# Patient Record
Sex: Female | Born: 1995 | Race: White | Hispanic: No | Marital: Single | Smoking: Never smoker
Health system: Southern US, Community
[De-identification: ages and names within clinical notes are randomized; demographics above are authoritative.]

## PROBLEM LIST (undated history)

## (undated) ENCOUNTER — Inpatient Hospital Stay (HOSPITAL_COMMUNITY): Payer: Self-pay

## (undated) DIAGNOSIS — M549 Dorsalgia, unspecified: Secondary | ICD-10-CM

## (undated) DIAGNOSIS — O139 Gestational [pregnancy-induced] hypertension without significant proteinuria, unspecified trimester: Secondary | ICD-10-CM

## (undated) DIAGNOSIS — A599 Trichomoniasis, unspecified: Secondary | ICD-10-CM

## (undated) DIAGNOSIS — F419 Anxiety disorder, unspecified: Secondary | ICD-10-CM

## (undated) DIAGNOSIS — R51 Headache: Secondary | ICD-10-CM

## (undated) DIAGNOSIS — B009 Herpesviral infection, unspecified: Secondary | ICD-10-CM

## (undated) DIAGNOSIS — R519 Headache, unspecified: Secondary | ICD-10-CM

## (undated) DIAGNOSIS — B999 Unspecified infectious disease: Secondary | ICD-10-CM

## (undated) DIAGNOSIS — M62838 Other muscle spasm: Secondary | ICD-10-CM

## (undated) DIAGNOSIS — G8929 Other chronic pain: Secondary | ICD-10-CM

## (undated) DIAGNOSIS — K219 Gastro-esophageal reflux disease without esophagitis: Secondary | ICD-10-CM

## (undated) HISTORY — DX: Anxiety disorder, unspecified: F41.9

## (undated) HISTORY — DX: Trichomoniasis, unspecified: A59.9

## (undated) HISTORY — DX: Other muscle spasm: M62.838

## (undated) HISTORY — DX: Headache: R51

## (undated) HISTORY — DX: Other chronic pain: G89.29

## (undated) HISTORY — DX: Dorsalgia, unspecified: M54.9

## (undated) HISTORY — DX: Herpesviral infection, unspecified: B00.9

## (undated) HISTORY — DX: Headache, unspecified: R51.9

## (undated) HISTORY — PX: NO PAST SURGERIES: SHX2092

---
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
## 2016-07-24 ENCOUNTER — Encounter (HOSPITAL_COMMUNITY): Payer: Self-pay

## 2016-07-24 ENCOUNTER — Emergency Department (HOSPITAL_COMMUNITY)
Admission: EM | Admit: 2016-07-24 | Discharge: 2016-07-24 | Disposition: A | Discharge status: Home / Self Care | Payer: Medicaid Other | Attending: Emergency Medicine | Admitting: Emergency Medicine

## 2016-07-24 DIAGNOSIS — K529 Noninfective gastroenteritis and colitis, unspecified: Secondary | ICD-10-CM

## 2016-07-24 LAB — COMPREHENSIVE METABOLIC PANEL
ALT: 21 U/L (ref 14–54)
AST: 24 U/L (ref 15–41)
Albumin: 3.9 g/dL (ref 3.5–5.0)
Alkaline Phosphatase: 83 U/L (ref 38–126)
Anion gap: 8 (ref 5–15)
BUN: 10 mg/dL (ref 6–20)
CHLORIDE: 106 mmol/L (ref 101–111)
CO2: 25 mmol/L (ref 22–32)
Calcium: 8.8 mg/dL — ABNORMAL LOW (ref 8.9–10.3)
Creatinine, Ser: 0.73 mg/dL (ref 0.44–1.00)
GFR calc Af Amer: >60 mL/min (ref >60)
Glucose, Bld: 97 mg/dL (ref 65–99)
POTASSIUM: 3.3 mmol/L — AB (ref 3.5–5.1)
SODIUM: 139 mmol/L (ref 135–145)
Total Bilirubin: 0.5 mg/dL (ref 0.3–1.2)
Total Protein: 7.1 g/dL (ref 6.5–8.1)

## 2016-07-24 LAB — CBC
HEMATOCRIT: 41.4 % (ref 36.0–46.0)
Hemoglobin: 13.7 g/dL (ref 12.0–15.0)
MCH: 30.9 pg (ref 26.0–34.0)
MCHC: 33.1 g/dL (ref 30.0–36.0)
MCV: 93.2 fL (ref 78.0–100.0)
Platelets: 190 10*3/uL (ref 150–400)
RBC: 4.44 MIL/uL (ref 3.87–5.11)
RDW: 12.4 % (ref 11.5–15.5)
WBC: 7.2 10*3/uL (ref 4.0–10.5)

## 2016-07-24 LAB — I-STAT BETA HCG BLOOD, ED (MC, WL, AP ONLY): I-stat hCG, quantitative: <5 m[IU]/mL (ref <5)

## 2016-07-24 LAB — LIPASE, BLOOD: LIPASE: 87 U/L — AB (ref 11–51)

## 2016-07-24 MED ORDER — ONDANSETRON HCL 4 MG/2ML IJ SOLN
4.0000 mg | Freq: Once | INTRAMUSCULAR | Status: DC
  Filled 2016-07-24: qty 2

## 2016-07-24 MED ORDER — SODIUM CHLORIDE 0.9 % IV BOLUS (SEPSIS)
1000.0000 mL | Freq: Once | INTRAVENOUS | Status: AC
  Administered 2016-07-24: 1000 mL via INTRAVENOUS

## 2016-07-24 MED ORDER — ONDANSETRON 4 MG PO TBDP
ORAL_TABLET | ORAL | Status: AC
  Filled 2016-07-24: qty 1

## 2016-07-24 MED ORDER — ONDANSETRON 4 MG PO TBDP
4.0000 mg | ORAL_TABLET | Freq: Once | ORAL | Status: AC | PRN
Start: 2016-07-24 — End: 2016-07-24
  Administered 2016-07-24: 4 mg via ORAL

## 2016-07-24 MED ORDER — KETOROLAC TROMETHAMINE 30 MG/ML IJ SOLN
30.0000 mg | Freq: Once | INTRAMUSCULAR | Status: AC
  Administered 2016-07-24: 30 mg via INTRAVENOUS
  Filled 2016-07-24: qty 1

## 2016-07-24 MED ORDER — ONDANSETRON HCL 8 MG PO TABS: 8.0000 mg | ORAL_TABLET | Freq: Three times a day (TID) | ORAL | 0 refills | Status: DC | PRN

## 2016-07-24 NOTE — ED Provider Notes (Signed)
[facility]-EMERGENCY DEPT Provider Note   CSN: [id number] Arrival date & time: 07/24/16  2020     History   Chief Complaint Chief Complaint  Patient presents with  . Abdominal Pain    HPI Judith Terry is a 20 y.o. female.  Patient is a 20 year old female with no significant past medical history. She presents for evaluation of nausea, vomiting, and loose stools that is been ongoing for the past several days. All has been nonbloody and nonbilious. She denies any fevers or chills. She denies any ill contacts. She denies the consumption of any suspicious foods.   The history is provided by the patient.  Abdominal Pain   This is a new problem. Episode onset: Several days ago. The problem occurs constantly. The problem has not changed since onset.The pain is located in the periumbilical region. The pain is moderate. Pertinent negatives include fever, hematochezia, melena and constipation. Nothing aggravates the symptoms. Nothing relieves the symptoms.    Past Medical History:  Diagnosis Date  . GERD (gastroesophageal reflux disease)     Patient Active Problem List   Diagnosis Date Noted  . Preeclampsia 06/03/2015  . 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 Fetal echogenic bowel of fetus   . Contact with or exposure to sexually transmitted disease 11/25/2014  . Supervision of normal first pregnancy in first trimester 11/25/2014  . GERD (gastroesophageal reflux disease) 11/25/2014    Past Surgical History:  Procedure Laterality Date  . NO PAST SURGERIES      OB History    Gravida Para Term Preterm AB Living   1 1 1     1    SAB TAB Ectopic Multiple Live Births         0 1       Home Medications    Prior to Admission medications   Medication Sig Start Date End Date Taking? Authorizing Provider  cyclobenzaprine (FLEXERIL) 10 MG tablet Take 10 mg by mouth as needed for muscle spasms.    Historical Provider, MD  hpv vaccine  (GARDASIL) injection Inject 0.5 mLs into the muscle once. 07/10/15   Allie BossierMyra C Dove, MD  omeprazole (PRILOSEC) 40 MG capsule Take 40 mg by mouth as needed.    Historical Provider, MD    Family History Family History  Problem Relation Age of Onset  . Diabetes Mother   . Diabetes Father   . Heart disease Father     Social History Social History  Substance Use Topics  . Smoking status: Never Smoker  . Smokeless tobacco: Never Used  . Alcohol use No     Allergies   Patient has no known allergies.   Review of Systems Review of Systems  Constitutional: Negative for fever.  Gastrointestinal: Positive for abdominal pain. Negative for constipation, hematochezia and melena.  All other systems reviewed and are negative.    Physical Exam Updated Vital Signs BP 122/83 (BP Location: Right Arm)   Pulse 93   Temp 98.5 F (36.9 C) (Oral)   Resp 18   Ht 5\' 9"  (1.753 m)   Wt 221 lb 8 oz (100.5 kg)   LMP 07/22/2016   SpO2 99%   BMI 32.71 kg/m   Physical Exam  Constitutional: She is oriented to person, place, and time. She appears well-developed and well-nourished. No distress.  HENT:  Head: Normocephalic and atraumatic.  Neck: Normal range of motion. Neck supple.  Cardiovascular: Normal rate and regular rhythm.  Exam reveals no gallop  and no friction rub.   No murmur heard. Pulmonary/Chest: Effort normal and breath sounds normal. No respiratory distress. She has no wheezes.  Abdominal: Soft. Bowel sounds are normal. She exhibits no distension. There is tenderness.  There is mild tenderness to palpation in the periumbilical region.  Musculoskeletal: Normal range of motion.  Neurological: She is alert and oriented to person, place, and time.  Skin: Skin is warm and dry. She is not diaphoretic.  Nursing note and vitals reviewed.    ED Treatments / Results  Labs (all labs ordered are listed, but only abnormal results are displayed) Labs Reviewed  LIPASE, BLOOD - Abnormal;  Notable for the following:       Result Value   Lipase 87 (*)    All other components within normal limits  COMPREHENSIVE METABOLIC PANEL - Abnormal; Notable for the following:    Potassium 3.3 (*)    Calcium 8.8 (*)    All other components within normal limits  CBC  URINALYSIS, ROUTINE W REFLEX MICROSCOPIC (NOT AT [facility])  I-STAT BETA HCG BLOOD, ED ([facility], [facility], [facility] ONLY)    EKG  EKG Interpretation None       Radiology No results found.  Procedures Procedures (including critical care time)  Medications Ordered in ED Medications  ondansetron (ZOFRAN) injection 4 mg (4 mg Intravenous Refused 07/24/16 2147)  ondansetron (ZOFRAN-ODT) disintegrating tablet 4 mg (4 mg Oral Given 07/24/16 2033)  ketorolac (TORADOL) 30 MG/ML injection 30 mg (30 mg Intravenous Given 07/24/16 2147)  sodium chloride 0.9 % bolus 1,000 mL (1,000 mLs Intravenous New Bag/Given 07/24/16 2146)     Initial Impression / Assessment and Plan / ED Course  I have reviewed the triage vital signs and the nursing notes.  Pertinent labs & imaging results that were available during my care of the patient were reviewed by me and considered in my medical decision making (see chart for details).  Clinical Course     Symptoms most likely viral in nature. Her lipase is slightly elevated, the significance of which I am uncertain. I suspect this is likely related to vomiting. She will be discharged with Zofran and when necessary return.  Final Clinical Impressions(s) / ED Diagnoses   Final diagnoses:  None    New Prescriptions New Prescriptions   No medications on file     Geoffery Lyonsouglas Kenrick Pore, MD 07/24/16 2211

## 2016-07-24 NOTE — ED Triage Notes (Signed)
Onset 07-20-16 abd pain, nausea, and vomiting.  Last vomited this morning.  Last BM  This morning, loose.

## 2016-07-24 NOTE — Discharge Instructions (Signed)
Zofran as prescribed as needed for nausea.  A liquid diet for the next 2 days, then slowly advance to normal.  Return to the emergency department if you develop worsening pain, fevers, bloody stools, or other new and concerning symptoms.

## 2017-01-05 ENCOUNTER — Emergency Department (HOSPITAL_COMMUNITY)
Admission: EM | Admit: 2017-01-05 | Discharge: 2017-01-06 | Disposition: A | Discharge status: Home / Self Care | Payer: BLUE CROSS/BLUE SHIELD | Attending: Emergency Medicine | Admitting: Emergency Medicine

## 2017-01-05 ENCOUNTER — Encounter (HOSPITAL_COMMUNITY): Payer: Self-pay | Admitting: Emergency Medicine

## 2017-01-05 DIAGNOSIS — N644 Mastodynia: Secondary | ICD-10-CM | POA: Insufficient documentation

## 2017-01-05 LAB — POC URINE PREG, ED: PREG TEST UR: NEGATIVE

## 2017-01-05 MED ORDER — IBUPROFEN 800 MG PO TABS
800.0000 mg | ORAL_TABLET | Freq: Once | ORAL | Status: AC
  Administered 2017-01-05: 800 mg via ORAL
  Filled 2017-01-05: qty 1

## 2017-01-05 MED ORDER — NAPROXEN 500 MG PO TABS: 500.0000 mg | ORAL_TABLET | Freq: Two times a day (BID) | ORAL | 0 refills | Status: DC

## 2017-01-05 NOTE — ED Notes (Signed)
c/o left sided mast pain verbal  Hurts for 1 week  No rash, bump or other present  Verbalized by pt / non assessment description

## 2017-01-05 NOTE — ED Triage Notes (Signed)
Pt comes with complaints of left breast pain that started last week.  Intermittent through out today. Denies noticing any lumps or changes to breast.  A&O x4.  No other complaints at this time.

## 2017-01-05 NOTE — ED Provider Notes (Signed)
[facility]-EMERGENCY DEPT Provider Note   CSN: [id number] Arrival date & time: 01/05/17  2010   By signing my name below, I, Judith Terry, attest that this documentation has been prepared under the direction and in the presence of Parminder Trapani, MD. Electronically signed, Judith Terry, ED Scribe. 01/05/17. 11:11 PM.   History   Chief Complaint Chief Complaint  Patient presents with  . Breast Pain   The history is provided by the patient and medical records. No language interpreter was used.  Illness  This is a new problem. The current episode started more than 1 week ago. The problem occurs constantly. The problem has not changed since onset.Pertinent negatives include no chest pain, no abdominal pain, no headaches and no shortness of breath. The symptoms are aggravated by twisting. Nothing relieves the symptoms. She has tried nothing for the symptoms. The treatment provided no relief.    Judith Terry is a 21 y.o. female with h/o GERD, who presents to the Emergency Department with concern for breast pain x 1 week. She states her pain is worse with  Palpation and lifting her daughter.   No leg swelling, long distance trips chest wall swelling or tenderness vomiting, abnormal menstruation, not nipple discharge or retraction.  No exertional chest pain.  No shortness of breath.  No leg pain or swelling, no surgeries.     Past Medical History:  Diagnosis Date  . GERD (gastroesophageal reflux disease)     Patient Active Problem List   Diagnosis Date Noted  . Preeclampsia 06/03/2015  . 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 Fetal echogenic bowel of fetus   . Contact with or exposure to sexually transmitted disease 11/25/2014  . Supervision of normal first pregnancy in first trimester 11/25/2014  . GERD (gastroesophageal reflux disease) 11/25/2014    Past Surgical History:  Procedure Laterality Date  . NO PAST SURGERIES      OB History    Gravida Para Term Preterm AB Living   1 1 1     1    SAB TAB Ectopic Multiple Live Births         0 1       Home Medications    Prior to Admission medications   Medication Sig Start Date End Date Taking? Authorizing Provider  diphenhydrAMINE (BENADRYL) 25 mg capsule Take 25 mg by mouth every 6 (six) hours as needed for itching.   Yes Historical Provider, MD  omeprazole (PRILOSEC OTC) 20 MG tablet Take 20 mg by mouth daily.   Yes Historical Provider, MD  hpv vaccine (GARDASIL) injection Inject 0.5 mLs into the muscle once. Patient not taking: Reported on 01/05/2017 07/10/15   Allie BossierMyra C Dove, MD  ondansetron (ZOFRAN) 8 MG tablet Take 1 tablet (8 mg total) by mouth every 8 (eight) hours as needed for nausea. Patient not taking: Reported on 01/05/2017 07/24/16   Geoffery Lyonsouglas Delo, MD    Family History Family History  Problem Relation Age of Onset  . Diabetes Mother   . Diabetes Father   . Heart disease Father     Social History Social History  Substance Use Topics  . Smoking status: Never Smoker  . Smokeless tobacco: Never Used  . Alcohol use No     Allergies   Patient has no known allergies.   Review of Systems Review of Systems  Respiratory: Negative for cough, choking and shortness of breath.   Cardiovascular: Negative for chest pain, palpitations and leg swelling.  Gastrointestinal: Negative for abdominal pain.  Neurological: Negative for headaches.  All other systems reviewed and are negative.    Physical Exam Updated Vital Signs BP 114/75 (BP Location: Left Arm)   Pulse 70   Temp 98.7 F (37.1 C) (Oral)   Resp 18   Ht 5\' 9"  (1.753 m)   Wt 220 lb (99.8 kg)   SpO2 100%   BMI 32.49 kg/m   Physical Exam  Constitutional: She is oriented to person, place, and time. She appears well-developed and well-nourished. No distress.  HENT:  Mouth/Throat: Oropharynx is clear and moist and mucous membranes are normal. No oropharyngeal exudate.  Eyes: Conjunctivae are normal.  Pupils are equal, round, and reactive to light.  Neck: Normal range of motion. Neck supple. No JVD present. Carotid bruit is not present.  Cardiovascular: Normal rate, regular rhythm, normal heart sounds and intact distal pulses.     Pulmonary/Chest: Effort normal and breath sounds normal. No stridor. She has no wheezes. She has no rales.  Abdominal: Soft. Bowel sounds are normal. She exhibits no fluid wave and no mass. There is no tenderness. There is no rebound and no guarding.  Musculoskeletal: She exhibits no edema.  Intact distal pulses Breast exam:  Chaperone present: No lumps no nipple discharge no lymph adenopathy  Neurological: She is alert and oriented to person, place, and time. She displays normal reflexes.  Nursing note and vitals reviewed.    ED Treatments / Results   Vitals:   01/05/17 2020 01/05/17 2208  BP: (!) 113/96 114/75  Pulse: 88 70  Resp: 20 18  Temp: 98.7 F (37.1 C)     DIAGNOSTIC STUDIES: Oxygen Saturation is 100% on RA, NL by my interpretation.    COORDINATION OF CARE: 11:22 PM-Discussed next steps with pt. Pt verbalized understanding and is agreeable with the plan. Will order medications.    Radiology No results found.  Procedures Procedures (including critical care time)  Medications Ordered in ED  Medications  ibuprofen (ADVIL,MOTRIN) tablet 800 mg (800 mg Oral Given 01/05/17 2337)     Wells score is 0 for PE  Final Clinical Impressions(s) / ED Diagnoses  Breast pain. I suspect fibrocystic breast issues.  Will refer to the breast center for imaging and start NSAIDS.   No caffeine.  Return immediately for weakness, numbness, exertional chest pain or shortness of breath,  fever >101 or leg pain or swelling, coughing up blood or any concerns. She must also follow up with her PMD and the breast center.    I have reviewed the triage vital signs and the nursing notes. Pertinent labs &imaging results that were available during my care of  the patient were reviewed by me and considered in my medical decision making (see chart for details). The patient is nontoxic-appearing on exam and vital signs are within normal limits.    After history, exam, and medical workup I feel the patient has been appropriately medically screened and is safe for discharge home. Pertinent diagnoses were discussed with the patient. Patient was given return precautions.   I personally performed the services described in this documentation, which was scribed in my presence. The recorded information has been reviewed and is accurate.     Cy Blamer, MD 01/05/17 [phone]

## 2017-01-13 ENCOUNTER — Encounter: Payer: Self-pay | Admitting: Family Medicine

## 2017-01-13 ENCOUNTER — Other Ambulatory Visit: Payer: Self-pay | Admitting: Family Medicine

## 2017-01-13 ENCOUNTER — Ambulatory Visit (INDEPENDENT_AMBULATORY_CARE_PROVIDER_SITE_OTHER): Payer: Medicaid Other | Admitting: Family Medicine

## 2017-01-13 VITALS — BP 138/80 | HR 80 | Resp 16 | Ht 69.0 in | Wt 228.0 lb

## 2017-01-13 DIAGNOSIS — R0789 Other chest pain: Secondary | ICD-10-CM | POA: Diagnosis not present

## 2017-01-13 DIAGNOSIS — Z1389 Encounter for screening for other disorder: Secondary | ICD-10-CM | POA: Diagnosis not present

## 2017-01-13 DIAGNOSIS — N644 Mastodynia: Secondary | ICD-10-CM

## 2017-01-13 LAB — POCT URINALYSIS DIP (DEVICE)
Bilirubin Urine: NEGATIVE
Glucose, UA: NEGATIVE mg/dL
HGB URINE DIPSTICK: NEGATIVE
Ketones, ur: NEGATIVE mg/dL
NITRITE: NEGATIVE
PH: 5.5 (ref 5.0–8.0)
PROTEIN: NEGATIVE mg/dL
Specific Gravity, Urine: 1.025 (ref 1.005–1.030)
UROBILINOGEN UA: 0.2 mg/dL (ref 0.0–1.0)

## 2017-01-13 LAB — CBC WITH DIFFERENTIAL/PLATELET
Basophils Absolute: 0 cells/uL (ref 0–200)
Basophils Relative: 0 %
EOS ABS: 292 {cells}/uL (ref 15–500)
Eosinophils Relative: 4 %
HEMATOCRIT: 41 % (ref 35.0–45.0)
Hemoglobin: 13.2 g/dL (ref 11.7–15.5)
LYMPHS PCT: 28 %
Lymphs Abs: 2044 cells/uL (ref 850–3900)
MCH: 30.7 pg (ref 27.0–33.0)
MCHC: 32.2 g/dL (ref 32.0–36.0)
MCV: 95.3 fL (ref 80.0–100.0)
MONO ABS: 219 {cells}/uL (ref 200–950)
MONOS PCT: 3 %
MPV: 10.2 fL (ref 7.5–12.5)
NEUTROS PCT: 65 %
Neutro Abs: 4745 cells/uL (ref 1500–7800)
Platelets: 237 10*3/uL (ref 140–400)
RBC: 4.3 MIL/uL (ref 3.80–5.10)
RDW: 12.8 % (ref 11.0–15.0)
WBC: 7.3 10*3/uL (ref 3.8–10.8)

## 2017-01-13 MED ORDER — PREDNISONE 20 MG PO TABS: 40.0000 mg | ORAL_TABLET | Freq: Every day | ORAL | 0 refills | Status: AC

## 2017-01-13 NOTE — Progress Notes (Addendum)
Patient ID: [facility], female    DOB: 07/22/1996, 21 y.o.   MRN: [id number]  PCP: Bing Neighbors, FNP  Chief Complaint  Patient presents with  . Establish Care  . Breast Pain    left    Subjective:  HPI  Judith Terry is a 21 y.o. female presents to establish care and evaluate ongoing breast pain. Pregnancy is only significant medical history. On 01/05/17, Judith Terry presented to the ED with a complaint of breast pain present x 1 week.  Pain intensity reached 9/10 and is presently 6/10. Feels that the pain below the skin. Characterizes pain as aching and heavy sensation. Occasionally, the pain can feel sharp, and resolve spontaneously. She has been taking Naproxen as prescribed with only mild relief of pain. No prior issues with left breast pain, She is not breast feeding an denies any recent chest or breast trauma. She has a history of acid reflux and recently resumed taking omeprazole without relief of symptoms. She has a nexplanon in place and reports irregular menses.   Social History   Social History  . Marital status: Single    Spouse name: N/A  . Number of children: N/A  . Years of education: N/A   Occupational History  . Not on file.   Social History Main Topics  . Smoking status: Never Smoker  . Smokeless tobacco: Never Used  . Alcohol use No  . Drug use: No  . Sexual activity: Yes    Birth control/ protection: None   Other Topics Concern  . Not on file   Social History Narrative  . No narrative on file    Family History  Problem Relation Age of Onset  . Diabetes Mother   . Diabetes Father   . Heart disease Father    Review of Systems See HPI Patient Active Problem List   Diagnosis Date Noted  . Preeclampsia 06/03/2015  . 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 Fetal echogenic bowel of fetus   . Contact with or exposure to sexually transmitted disease 11/25/2014  . Supervision of normal first  pregnancy in first trimester 11/25/2014  . GERD (gastroesophageal reflux disease) 11/25/2014    No Known Allergies  Prior to Admission medications   Medication Sig Start Date End Date Taking? Authorizing Provider  naproxen (NAPROSYN) 500 MG tablet Take 1 tablet (500 mg total) by mouth 2 (two) times daily. 01/05/17  Yes Palumbo, April, MD  diphenhydrAMINE (BENADRYL) 25 mg capsule Take 25 mg by mouth every 6 (six) hours as needed for itching.    [provider]  hpv vaccine (GARDASIL) injection Inject 0.5 mLs into the muscle once. Patient not taking: Reported on 01/05/2017 07/10/15   Allie Bossier, MD  omeprazole (PRILOSEC OTC) 20 MG tablet Take 20 mg by mouth daily.    [provider]  ondansetron (ZOFRAN) 8 MG tablet Take 1 tablet (8 mg total) by mouth every 8 (eight) hours as needed for nausea. Patient not taking: Reported on 01/05/2017 07/24/16   Geoffery Lyons, MD    Past Medical, Surgical Family and Social History reviewed and updated.    Objective:   Today's Vitals   01/13/17 1105  BP: 138/80  Pulse: 80  Resp: 16  SpO2: 100%  Weight: 228 lb (103.4 kg)  Height: 5\' 9"  (1.753 m)    Wt Readings from Last 3 Encounters:  01/13/17 228 lb (103.4 kg)  01/05/17 220 lb (99.8 kg)  07/24/16 221  lb 8 oz (100.5 kg) (99 %, Z= 2.23)*   * Growth percentiles are based on CDC 2-20 Years data.    Physical Exam  Constitutional: She is oriented to person, place, and time. She appears well-developed and well-nourished.  HENT:  Head: Normocephalic and atraumatic.  Eyes: Conjunctivae are normal. Pupils are equal, round, and reactive to light.  Neck: Normal range of motion. Neck supple.  Cardiovascular: Normal rate, regular rhythm, normal heart sounds and intact distal pulses.   Pulmonary/Chest: Chest wall is not dull to percussion. She exhibits tenderness. She exhibits no mass, no bony tenderness, no laceration, no crepitus, no edema, no deformity, no swelling and no retraction.  Left breast exhibits mass.    Pain is non-reproducible with palpation   Musculoskeletal: Normal range of motion.  Neurological: She is alert and oriented to person, place, and time.  Skin: Skin is warm and dry.  Psychiatric: She has a normal mood and affect. Her behavior is normal. Judgment and thought content normal.      Assessment & Plan:  1. Left-sided chest wall pain - CBC with Differential -Hold Naproxen for now. Start Prednisone 40 mg daily with breakfast. Pain and symptoms could be related to costochondritis and idiopathic chest wall inflammation. Apply heat to affected area as tolerated. Resume Naproxen as needed upon completion of Prednisone.  2. Breast pain - MM Digital Diagnostic Bilat  RTC: 12 month or sooner as  needed.    Godfrey PickKimberly S. Tiburcio PeaHarris, MSN, [facility] HospitalFNP-C [facility] [address] , [place] [address] [phone]

## 2017-01-13 NOTE — Patient Instructions (Addendum)
Tomorrow start prednisone 40 mg (2-20 mg tablets) for 5 days.  Stop taking naproxen while taking prednisone. You can take over the counter tylenol for acute pain then resume Naproxen after completing the prednisone.  I highly suspect you have chest wall pain related to costochondritis. However, I am ordering a diagnostic mammogram to confirm that there is nothing acute going on with your breast.   Call me in 1 week to let me know how you are. I will see you back in 6 months or sooner if needed.  To schedule your breast exam, please call [facility] at   Phone: [phone] to schedule an appointment .      Breast Tenderness Breast tenderness is a common problem for women of all ages. Breast tenderness may cause mild discomfort to severe pain. The pain usually comes and goes in association with your menstrual cycle, but it can be constant. Breast tenderness has many possible causes, including hormone changes and some medicines. Your health care provider may order tests, such as a mammogram or an ultrasound, to check for any unusual findings. Having breast tenderness usually does not mean that you have breast cancer. Follow these instructions at home: Sometimes, reassurance that you do not have breast cancer is all that is needed. In general, follow these home care instructions: Managing pain and discomfort   If directed, apply ice to the area:  Put ice in a plastic bag.  Place a towel between your skin and the bag.  Leave the ice on for 20 minutes, 2-3 times a day.  Make sure you are wearing a supportive bra, especially during exercise. You may also want to wear a supportive bra while sleeping if your breasts are very tender. Medicines   Take over-the-counter and prescription medicines only as told by your health care provider. If the cause of your pain is infection, you may be prescribed an antibiotic medicine.  If you were prescribed an antibiotic, take it as told  by your health care provider. Do not stop taking the antibiotic even if you start to feel better. General instructions   Your health care provider may recommend that you reduce the amount of fat in your diet. You can do this by:  Limiting fried foods.  Cooking foods using methods, such as baking, boiling, grilling, and broiling.  Decrease the amount of caffeine in your diet. You can do this by drinking more water and choosing caffeine-free options.  Keep a log of the days and times when your breasts are most tender.  Ask your health care provider how to do breast exams at home. This will help you notice if you have an unusual growth or lump. Contact a health care provider if:  Any part of your breast is hard, red, and hot to the touch. This may be a sign of infection.  You are not breastfeeding and you have fluid, especially blood or pus, coming out of your nipples.  You have a fever.  You have a new or painful lump in your breast that remains after your menstrual period ends.  Your pain does not improve or it gets worse.  Your pain is interfering with your daily activities. This information is not intended to replace advice given to you by your health care provider. Make sure you discuss any questions you have with your health care provider. Document Released: 08/04/2008 Document Revised: 05/20/2016 Document Reviewed: 05/20/2016 Elsevier Interactive Patient Education  2017 Elsevier Inc.  Costochondritis Costochondritis  is swelling and irritation (inflammation) of the tissue (cartilage) that connects your ribs to your breastbone (sternum). This causes pain in the front of your chest. Usually, the pain:  Starts gradually.  Is in more than one rib. This condition usually goes away on its own over time. Follow these instructions at home:  Do not do anything that makes your pain worse.  If directed, put ice on the painful area:  Put ice in a plastic bag.  Place a towel  between your skin and the bag.  Leave the ice on for 20 minutes, 2-3 times a day.  If directed, put heat on the affected area as often as told by your doctor. Use the heat source that your doctor tells you to use, such as a moist heat pack or a heating pad.  Place a towel between your skin and the heat source.  Leave the heat on for 20-30 minutes.  Take off the heat if your skin turns bright red. This is very important if you cannot feel pain, heat, or cold. You may have a greater risk of getting burned.  Take over-the-counter and prescription medicines only as told by your doctor.  Return to your normal activities as told by your doctor. Ask your doctor what activities are safe for you.  Keep all follow-up visits as told by your doctor. This is important. Contact a doctor if:  You have chills or a fever.  Your pain does not go away or it gets worse.  You have a cough that does not go away. Get help right away if:  You are short of breath. This information is not intended to replace advice given to you by your health care provider. Make sure you discuss any questions you have with your health care provider. Document Released: 02/08/2008 Document Revised: 03/11/2016 Document Reviewed: 12/16/2015 Elsevier Interactive Patient Education  2017 [employer]

## 2017-01-26 ENCOUNTER — Ambulatory Visit
Admission: RE | Admit: 2017-01-26 | Discharge: 2017-01-26 | Disposition: A | Discharge status: Home / Self Care | Payer: BLUE CROSS/BLUE SHIELD | Source: Ambulatory Visit | Attending: Family Medicine | Admitting: Family Medicine

## 2017-01-26 DIAGNOSIS — N644 Mastodynia: Secondary | ICD-10-CM

## 2017-07-17 ENCOUNTER — Encounter: Payer: Self-pay | Admitting: Family Medicine

## 2017-07-17 ENCOUNTER — Ambulatory Visit (INDEPENDENT_AMBULATORY_CARE_PROVIDER_SITE_OTHER): Payer: Self-pay | Admitting: Family Medicine

## 2017-07-17 VITALS — BP 110/68 | HR 70 | Temp 98.2°F | Ht 69.0 in | Wt 236.0 lb

## 2017-07-17 DIAGNOSIS — M546 Pain in thoracic spine: Secondary | ICD-10-CM

## 2017-07-17 DIAGNOSIS — G8929 Other chronic pain: Secondary | ICD-10-CM

## 2017-07-17 DIAGNOSIS — Z23 Encounter for immunization: Secondary | ICD-10-CM

## 2017-07-17 MED ORDER — CYCLOBENZAPRINE HCL 10 MG PO TABS: 10.0000 mg | ORAL_TABLET | Freq: Three times a day (TID) | ORAL | 0 refills | Status: DC | PRN

## 2017-07-17 NOTE — Patient Instructions (Signed)
For back pain, I have prescribed Cyclobenzaprine 10 mg, you may take medication up to 3 times per day.  I am including contraceptive options for you to consider upon removal of your nexplanon.   Hormonal Contraception Information Hormonal contraception is a type of birth control that uses hormones to prevent pregnancy. It usually involves a combination of the hormones estrogen and progesterone or only the hormone progesterone. Hormonal contraception works in these ways:  It thickens the mucus in the cervix, making it harder for sperm to enter the uterus.  It changes the lining of the uterus, making it harder for an egg to implant.  It may stop the ovaries from releasing eggs (ovulation). Some women who take hormonal contraceptives that contain only progesterone may continue to ovulate.  Hormonal contraception cannot prevent sexually transmitted infections (STIs). Pregnancy may still occur. Estrogen and progesterone contraceptives Contraceptives that use a combination of estrogen and progesterone are available in these forms:  Pill. Pills come in different combinations of hormones. They must be taken at the same time each day. Pills can affect your period, causing you to get your period once every three months or not at all.  Patch. The patch must be worn on the lower abdomen for three weeks and then removed on the fourth.  Vaginal ring. The ring is placed in the vagina and left there for three weeks. It is then removed for one week.  Progesterone contraceptives Contraceptives that use progesterone only are available in these forms:  Pill. Pills should be taken every day of the cycle.  Intrauterine device (IUD). This device is inserted into the uterus and removed or replaced every five years or sooner.  Implant. Plastic rods are placed under the skin of the upper arm. They are removed or replaced every three years or sooner.  Injection. The injection is given once every 90  days.  What are the side effects? The side effects of estrogen and progesterone contraceptives include:  Nausea.  Headaches.  Breast tenderness.  Bleeding or spotting between menstrual cycles.  High blood pressure (rare).  Strokes, heart attacks, or blood clots (rare)  Side effects of progesterone-only contraceptives include:  Nausea.  Headaches.  Breast tenderness.  Unpredictable menstrual bleeding.  High blood pressure (rare).  Talk to your health care provider about what side effects may affect you. Where to find more information:  Ask your health care provider for more information and resources about hormonal contraception.  U.S. Department of Health and CytogeneticistHuman Services Office on Women's Health: http://hoffman.com/www.womenshealth.gov Questions to ask:  What type of hormonal contraception is right for me?  How long should I plan to use hormonal contraception?  What are the side effects of the hormonal contraception method I choose?  How can I prevent STIs while using hormonal contraception? Contact a health care provider if:  You start taking hormonal contraceptives and you develop persistent or severe side effects. Summary  Estrogen and progesterone are hormones used in many forms of birth control.  Talk to your health care provider about what side effects may affect you.  Hormonal contraception cannot prevent sexually transmitted infections (STIs).  Ask your health care provider for more information and resources about hormonal contraception. This information is not intended to replace advice given to you by your health care provider. Make sure you discuss any questions you have with your health care provider. Document Released: 09/11/2007 Document Revised: 07/22/2016 Document Reviewed: 07/22/2016 Elsevier Interactive Patient Education  [employer]2018 Elsevier Inc.

## 2017-07-17 NOTE — Progress Notes (Signed)
[facility]    DOB: 01/20/1996, 21 y.o.   MRN: [id number]  PCP: Bing NeighborsHarris, Peighton Mehra S, FNP  Chief Complaint  Patient presents with  . Follow-up    Subjective:  HPI Judith ShackletonValarie Terry is a 21 y.o. female presents today with a complaint of chronic low back pain. Judith Terry reports intermittent back pain since pregnancy. She is now working as a Sports administratorcustomer service personnel at [employer] which requires lots of lifting and bending. Since starting her new position, she complains of generalized aching of her back. Denies pain radiating into legs or any associated weakness. Pain is characterized as aching and is exacerbated by prolonged standing. She has not attempted relief with any OTC medication. Judith Terry would also like information on contraceptive options as she plans to have her Nexplanon removed next year.  Social History   Socioeconomic History  . Marital status: Single    Spouse name: Not on file  . Number of children: Not on file  . Years of education: Not on file  . Highest education level: Not on file  Social Needs  . Financial resource strain: Not on file  . Food insecurity - worry: Not on file  . Food insecurity - inability: Not on file  . Transportation needs - medical: Not on file  . Transportation needs - non-medical: Not on file  Occupational History  . Not on file  Tobacco Use  . Smoking status: Never Smoker  . Smokeless tobacco: Never Used  Substance and Sexual Activity  . Alcohol use: No  . Drug use: No  . Sexual activity: Yes    Birth control/protection: None  Other Topics Concern  . Not on file  Social History Narrative  . Not on file    Family History  Problem Relation Age of Onset  . Diabetes Mother   . Diabetes Father   . Heart disease Father    Review of Systems  Respiratory: Negative.   Cardiovascular: Negative.   Musculoskeletal: Positive for back pain.  Neurological: Negative.   Psychiatric/Behavioral: Negative.      Patient Active Problem List   Diagnosis Date Noted  . Preeclampsia 06/03/2015  . 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 Fetal echogenic bowel of fetus   . Contact with or exposure to sexually transmitted disease 11/25/2014  . Supervision of normal first pregnancy in first trimester 11/25/2014  . GERD (gastroesophageal reflux disease) 11/25/2014    No Known Allergies  Prior to Admission medications   Medication Sig Start Date End Date Taking? Authorizing Provider  naproxen (NAPROSYN) 500 MG tablet Take 1 tablet (500 mg total) by mouth 2 (two) times daily. 01/05/17  Yes Palumbo, April, MD  omeprazole (PRILOSEC OTC) 20 MG tablet Take 20 mg by mouth daily.   Yes [provider]  diphenhydrAMINE (BENADRYL) 25 mg capsule Take 25 mg by mouth every 6 (six) hours as needed for itching.    [provider]  hpv vaccine (GARDASIL) injection Inject 0.5 mLs into the muscle once. Patient not taking: Reported on 07/17/2017 07/10/15   Allie Bossierove, Myra C, MD  ondansetron (ZOFRAN) 8 MG tablet Take 1 tablet (8 mg total) by mouth every 8 (eight) hours as needed for nausea. Patient not taking: Reported on 07/17/2017 07/24/16   Geoffery Lyonselo, Douglas, MD    Past Medical, Surgical Family and Social History reviewed and updated.    Objective:   Today's Vitals   07/17/17 0843  BP: 110/68  Pulse: 70  Temp: 98.2 F (36.8 C)  TempSrc: Oral  SpO2: 100%  Weight: 236 lb (107 kg)  Height: 5\' 9"  (1.753 m)    Wt Readings from Last 3 Encounters:  07/17/17 236 lb (107 kg)  01/13/17 228 lb (103.4 kg)  01/05/17 220 lb (99.8 kg)    Physical Exam  Constitutional: She is oriented to person, place, and time. She appears well-developed and well-nourished.  Eyes: Conjunctivae and EOM are normal. Pupils are equal, round, and reactive to light.  Neck: Normal range of motion.  Cardiovascular: Normal rate, regular rhythm, normal heart sounds and intact distal pulses.   Pulmonary/Chest: Effort normal and breath sounds normal.  Musculoskeletal: Normal range of motion.       Thoracic back: She exhibits tenderness and pain. She exhibits no edema, no spasm and normal pulse.  Neurological: She is alert and oriented to person, place, and time.  Skin: Skin is warm and dry.  Psychiatric: She has a normal mood and affect. Her behavior is normal. Judgment and thought content normal.   Assessment & Plan:  1. Chronic bilateral thoracic back pain, will trial naproxen 500 mg twice daily and cyclobenzaprine 10 mg 3 times daily as needed for back pain.   2. Need for immunization against influenza- Flu Vaccine QUAD 36+ mos IM   RTC: 12 months for CPE and contraceptive management.   Godfrey PickKimberly S. Tiburcio PeaHarris, MSN, FNP-C [facility]  [address] [place], [place] [address] [phone]

## 2017-07-25 MED FILL — ?CYCLOBENZAPRINE 10 MG TABL: 10 | 10 days supply | Qty: 30 | Fill #0

## 2017-07-26 ENCOUNTER — Encounter: Payer: Self-pay | Admitting: Family Medicine

## 2017-10-28 ENCOUNTER — Other Ambulatory Visit: Payer: Self-pay | Admitting: Family Medicine

## 2017-10-30 MED ORDER — CYCLOBENZAPRINE HCL 10 MG PO TABS: 10.0000 mg | ORAL_TABLET | Freq: Three times a day (TID) | ORAL | 0 refills | Status: DC | PRN

## 2017-10-30 MED FILL — CYCLOBENZAPRINE 10 MG TAB: 10 | 10 days supply | Qty: 30 | Fill #0

## 2017-12-27 ENCOUNTER — Ambulatory Visit (INDEPENDENT_AMBULATORY_CARE_PROVIDER_SITE_OTHER): Payer: Self-pay | Admitting: Family Medicine

## 2017-12-27 ENCOUNTER — Encounter: Payer: Self-pay | Admitting: Family Medicine

## 2017-12-27 VITALS — BP 122/72 | HR 94 | Temp 98.1°F | Ht 69.0 in | Wt 255.0 lb

## 2017-12-27 DIAGNOSIS — G44209 Tension-type headache, unspecified, not intractable: Secondary | ICD-10-CM

## 2017-12-27 DIAGNOSIS — E8881 Metabolic syndrome: Secondary | ICD-10-CM

## 2017-12-27 DIAGNOSIS — F411 Generalized anxiety disorder: Secondary | ICD-10-CM

## 2017-12-27 DIAGNOSIS — F4321 Adjustment disorder with depressed mood: Secondary | ICD-10-CM

## 2017-12-27 LAB — POCT GLYCOSYLATED HEMOGLOBIN (HGB A1C): HEMOGLOBIN A1C: 5

## 2017-12-27 MED ORDER — BUTALBITAL-APAP-CAFFEINE 50-325-40 MG PO TABS: 1.0000 | ORAL_TABLET | Freq: Four times a day (QID) | ORAL | 0 refills | Status: DC | PRN

## 2017-12-27 MED ORDER — BUSPIRONE HCL 5 MG PO TABS: 5.0000 mg | ORAL_TABLET | Freq: Two times a day (BID) | ORAL | 5 refills | Status: DC

## 2017-12-27 MED FILL — busPIRone HCL 5 MG TABS: 5 | 30 days supply | Qty: 60 | Fill #0

## 2017-12-27 NOTE — Progress Notes (Addendum)
   Subjective:    Patient ID: Judith Terry, female    DOB: 06/17/1996, 22 y.o.   MRN: [id number]  HPI  Review of Systems     Objective:   Physical Exam        Assessment & Plan:

## 2017-12-27 NOTE — Progress Notes (Signed)
Chief Complaint  Patient presents with  . Anxiety  . Headache   Subjective:    Patient ID: Judith Terry, female    DOB: 1995/11/01, 22 y.o.   MRN: [id number]  HPI [facility], a 22 year old female presents complaining of frequent headaches and anxiety.  Patient endorses daily headaches that primarily occur during work hours. Patient has not identified palliative factors associated. Provocative factors are bright lights. .  Current headache pain is 5/10 characterized as intermittent and aching.  Primary headache location is frontal.  She says that headaches are mostly behind the eyes when they occur.  She has attempted over-the-counter Tylenol without sustained relief. She denies fever, fatigue, phonophobia, nausea, vomiting, or diarrhea. Patient is also complaining of anxiety.  She has the following symptoms: chest pain, difficulty concentrating, dizziness, feelings of losing control, racing thoughts, shortness of breath. Patient says that symptoms occur at work and are mostly triggered by co-workers. Judith Terry also endorses family stressors. She denies current suicidal and homicidal ideation. Family history significant for anxiety and depression. Patient has not been treated for anxiety in the past and is not followed by psychology.   GAD 7 : Generalized Anxiety Score 12/27/2017 12/27/2017  Nervous, Anxious, on Edge 1 1  Control/stop worrying 2 2  Worry too much - different things 1 1  Trouble relaxing 1 -  Restless 0 0  Easily annoyed or irritable 1 1  Afraid - awful might happen 0 1  Total GAD 7 Score 6 -  Anxiety Difficulty - Not difficult at all     Past Medical History:  Diagnosis Date  . GERD (gastroesophageal reflux disease)    Social History   Socioeconomic History  . Marital status: Single    Spouse name: Not on file  . Number of children: Not on file  . Years of education: Not on file  . Highest education level: Not on file  Occupational History  . Not on file   Social Needs  . Financial resource strain: Not on file  . Food insecurity:    Worry: Not on file    Inability: Not on file  . Transportation needs:    Medical: Not on file    Non-medical: Not on file  Tobacco Use  . Smoking status: Never Smoker  . Smokeless tobacco: Never Used  Substance and Sexual Activity  . Alcohol use: No  . Drug use: No  . Sexual activity: Yes    Birth control/protection: None  Lifestyle  . Physical activity:    Days per week: Not on file    Minutes per session: Not on file  . Stress: Not on file  Relationships  . Social connections:    Talks on phone: Not on file    Gets together: Not on file    Attends religious service: Not on file    Active member of club or organization: Not on file    Attends meetings of clubs or organizations: Not on file    Relationship status: Not on file  . Intimate partner violence:    Fear of current or ex partner: Not on file    Emotionally abused: Not on file    Physically abused: Not on file    Forced sexual activity: Not on file  Other Topics Concern  . Not on file  Social History Narrative  . Not on file    Review of Systems  Respiratory: Negative.   Cardiovascular: Negative.   Gastrointestinal: Negative.   Genitourinary: Negative.  Skin: Negative.   Neurological: Positive for headaches.  Hematological: Negative.   Psychiatric/Behavioral: The patient is nervous/anxious.        Objective:   Physical Exam  Constitutional: She is oriented to person, place, and time. She appears well-developed and well-nourished.  HENT:  Head: Normocephalic and atraumatic.  Right Ear: External ear normal.  Nose: Nose normal.  Mouth/Throat: Oropharynx is clear and moist.  Eyes: Pupils are equal, round, and reactive to light.  Neck: Normal range of motion. Neck supple.  Cardiovascular: Normal rate, regular rhythm, normal heart sounds and intact distal pulses.  Pulmonary/Chest: Effort normal and breath sounds normal.   Abdominal: Soft. Bowel sounds are normal.  Neurological: She is alert and oriented to person, place, and time.  Skin: Skin is warm and dry.  Psychiatric: She has a normal mood and affect. Her behavior is normal. Thought content normal.      BP 122/72 (BP Location: Left Arm, Patient Position: Sitting, Cuff Size: Large)   Pulse 94   Temp 98.1 F (36.7 C) (Oral)   Ht 5\' 9"  (1.753 m)   Wt 255 lb (115.7 kg)   LMP 11/13/2017   SpO2 100%   BMI 37.66 kg/m  Assessment & Plan:  1. Generalized anxiety disorder We will start a trial of BuSpar 5 mg twice daily for generalized anxiety.  I also recommend the [facility] appointment.  Discussed operating hours which are Monday through Friday 8 AM to 3 PM. - busPIRone (BUSPAR) 5 MG tablet; Take 1 tablet (5 mg total) by mouth 2 (two) times daily.  Dispense: 60 tablet; Refill: 5  2. Situational depression - busPIRone (BUSPAR) 5 MG tablet; Take 1 tablet (5 mg total) by mouth 2 (two) times daily.  Dispense: 60 tablet; Refill: 5  3. Tension headache Will start a trial of Fioricet every 6 hours as needed for mild to moderate headache pain.  Also, discussed using Aleve 220 mg every 12 hours as needed. - butalbital-acetaminophen-caffeine (FIORICET, ESGIC) 50-325-40 MG tablet; Take 1-2 tablets by mouth every 6 (six) hours as needed for headache.  Dispense: 30 tablet; Refill: 0  4. Metabolic syndrome Recommend a lowfat, low carbohydrate diet divided over 5-6 small meals, increase water intake to 6-8 glasses, and 150 minutes per week of cardiovascular exercise.   - Comprehensive metabolic panel - HgB A1c - TSH  RTC: 1 month for pap smear and anxiety   The patient was given clear instructions to go to ER or return to medical center if symptoms do not improve, worsen or new problems develop. The patient verbalized understanding.     Nolon NationsLachina Moore Royalty Domagala  MSN, FNP-C Patient Care [facility] [address]  [place], [place] [address] [phone]

## 2017-12-27 NOTE — Patient Instructions (Addendum)
For generalized anxiety disorder, will start a trial of BuSpar 5 mg twice daily.  We will follow-up in 1 month for anxiety. For tension headaches, will start a trial of Fioricet every 6 hours as needed for moderate to severe headache.  Also, you can use over-the-counter Aleve twice daily as needed.  Recommend a low-fat, low-sodium diet divided over 5-6 small meals throughout the day.  Also increase water intake to about 6-8 glasses throughout the day.  Recommend 150 minutes of cardiovascular exercise per week.  Body mass index is 37.66 kg/m.    Diet for Metabolic Syndrome Metabolic syndrome is a disorder that includes at least three of these conditions:  Abdominal obesity.  Too much sugar in your blood.  High blood pressure.  Higher than normal amount of fat (lipids) in your blood.  Lower than normal level of "good" cholesterol (HDL).  Following a healthy diet can help to keep metabolic syndrome under control. It can also help to prevent the development of conditions that are associated with metabolic syndrome, such as diabetes, heart disease, and stroke. Along with exercise, a healthy diet:  Helps to improve the way that the body uses insulin.  Promotes weight loss. A common goal for people with this condition is to lose at least 7 to 10 percent of their starting weight.  What do I need to know about this diet?  Use the glycemic index (GI) to plan your meals. The index tells you how quickly a food will raise your blood sugar. Choose foods that have low GI values. These foods take a longer time to raise blood sugar.  Keep track of how many calories you take in. Eating the right amount of calories will help your achieve a healthy weight.  You may want to follow a Mediterranean diet. This diet includes lots of vegetables, lean meats or fish, whole grains, fruits, and healthy oils and fats. What foods can I eat? Grains Stone-ground whole wheat. Pumpernickel bread. Whole-grain  bread, crackers, tortillas, cereal, and pasta. Unsweetened oatmeal.Bulgur.Barley.Quinoa.Brown rice or wild rice. Vegetables Lettuce. Spinach. Peas. Beets. Cauliflower. Cabbage. Broccoli. Carrots. Tomatoes. Squash. Eggplant. Herbs. Peppers. Onions. Cucumbers. Brussels sprouts. Sweet potatoes. Yams. Beans. Lentils. Fruits Berries. Apples. Oranges. Grapes. Mango. Pomegranate. Kiwi. Cherries. Meats and Other Protein Sources Seafood and shellfish. Lean meats.Poultry. Tofu. Dairy Low-fat or fat-free dairy products, such as milk, yogurt, and cheese. Beverages Water. Low-fat milk. Milk alternatives, like soy milk or almond milk. Real fruit juice. Condiments Low-sugar or sugar-free ketchup, barbecue sauce, and mayonnaise. Mustard. Relish. Fats and Oils Avocado. Canola or olive oil. Nuts and nut butters.Seeds. The items listed above may not be a complete list of recommended foods or beverages. Contact your dietitian for more options. What foods are not recommended? Red meat. Palm oil and coconut oil. Processed foods. Fried foods. Alcohol. Sweetened drinks, such as iced tea and soda. Sweets. Salty foods. The items listed above may not be a complete list of foods and beverages to avoid. Contact your dietitian for more information. This information is not intended to replace advice given to you by your health care provider. Make sure you discuss any questions you have with your health care provider. Document Released: 01/06/2015 Document Revised: 01/01/2016 Document Reviewed: 09/03/2014 Elsevier Interactive Patient Education  2018 [employer].  Metabolic Syndrome Metabolic syndrome is the presence of at least three factors that increase your risk of getting cardiovascular disease and diabetes. These factors are:  High blood sugar.  High blood triglyceride level.  High blood  pressure.  Low levels of good blood cholesterol (high-density lipoprotein or HDL).  Excess weight around the waist.  This factor is present with a waist measurement of: ? More than 40 inches in men. ? More than 35 inches in women.  Metabolic syndrome is sometimes called insulin resistance syndrome and syndrome X. What are the causes? The exact cause is not known, but genetics and lifestyle choices play a role. What increases the risk? You are more likely to develop metabolic syndrome if:  You eat a diet high in calories and saturated fat.  You do not exercise regularly.  You are overweight.  You have a family history of metabolic syndrome.  You are Asian.  You are older in age.  You have insulin resistance.  You use any tobacco products, including cigarettes, chewing tobacco, or electronic cigarettes.  What are the signs or symptoms? Metabolic syndrome has no specific symptoms. How is this diagnosed? To make a diagnosis, your health care provider will determine whether you have at least three of the factors that make up metabolic syndrome by:  Taking your blood pressure.  Measuring your waist.  Ordering blood tests.  How is this treated? Treatment may include:  Lifestyle changes to reduce your risk for heart disease and stroke, such as: ? Exercise. ? Weight loss. ? Maintaining a healthy diet. ? Quitting the use of any tobacco products, including cigarettes, chewing tobacco, or electronic cigarettes.  Medicines that: ? Help your body to maintain glucose control. ? Reduce your blood pressure and your blood triglyceride levels.  Follow these instructions at home:  Exercise regularly.  Maintain a healthy diet.  Do not use any tobacco products, including cigarettes, chewing tobacco, or electronic cigarettes. If you need help quitting, ask your health care provider.  Keep all follow-up visits as directed by your health care provider. This is important.  Measure your waist regularly and record the measurement. To measure your waist: ? Stand up straight. ? Breathe out. ? Wrap  the measuring tape around the part of your waist that is just above your hipbones. ? Read the measurement. Contact a health care provider if:  You feel very tired.  You develop excessive thirst.  You pass large quantities of urine.  You put on weight around your waist.  You have headaches over and over again.  You have a dizzy spell. Get help right away if:  You develop sudden blurred vision.  You develop a sudden dizzy spell.  You have sudden trouble speaking or swallowing.  You have sudden weakness in your arm or leg.  You have chest pains or trouble breathing.  You feel like your heartbeat is abnormal.  You faint. This information is not intended to replace advice given to you by your health care provider. Make sure you discuss any questions you have with your health care provider. Document Released: 11/29/2007 Document Revised: 01/28/2016 Document Reviewed: 03/28/2014 Elsevier Interactive Patient Education  2018 [employer] Buspirone tablets What is this medicine? BUSPIRONE (byoo SPYE rone) is used to treat anxiety disorders. This medicine may be used for other purposes; ask your health care provider or pharmacist if you have questions. COMMON BRAND NAME(S): BuSpar What should I tell my health care provider before I take this medicine? They need to know if you have any of these conditions: -kidney or liver disease -an unusual or allergic reaction to buspirone, other medicines, foods, dyes, or preservatives -pregnant or trying to get pregnant -breast-feeding How should I use this medicine?  Take this medicine by mouth with a glass of water. Follow the directions on the prescription label. You may take this medicine with or without food. To ensure that this medicine always works the same way for you, you should take it either always with or always without food. Take your doses at regular intervals. Do not take your medicine more often than directed. Do not stop taking  except on the advice of your doctor or health care professional. Talk to your pediatrician regarding the use of this medicine in children. Special care may be needed. Overdosage: If you think you have taken too much of this medicine contact a poison control center or emergency room at once. NOTE: This medicine is only for you. Do not share this medicine with others. What if I miss a dose? If you miss a dose, take it as soon as you can. If it is almost time for your next dose, take only that dose. Do not take double or extra doses. What may interact with this medicine? Do not take this medicine with any of the following medications: -linezolid -MAOIs like Carbex, Eldepryl, Marplan, Nardil, and Parnate -methylene blue -procarbazine This medicine may also interact with the following medications: -diazepam -digoxin -diltiazem -erythromycin -grapefruit juice -haloperidol -medicines for mental depression or mood problems -medicines for seizures like carbamazepine, phenobarbital and phenytoin -nefazodone -other medications for anxiety -rifampin -ritonavir -some antifungal medicines like itraconazole, ketoconazole, and voriconazole -verapamil -warfarin This list may not describe all possible interactions. Give your health care provider a list of all the medicines, herbs, non-prescription drugs, or dietary supplements you use. Also tell them if you smoke, drink alcohol, or use illegal drugs. Some items may interact with your medicine. What should I watch for while using this medicine? Visit your doctor or health care professional for regular checks on your progress. It may take 1 to 2 weeks before your anxiety gets better. You may get drowsy or dizzy. Do not drive, use machinery, or do anything that needs mental alertness until you know how this drug affects you. Do not stand or sit up quickly, especially if you are an older patient. This reduces the risk of dizzy or fainting spells. Alcohol  can make you more drowsy and dizzy. Avoid alcoholic drinks. What side effects may I notice from receiving this medicine? Side effects that you should report to your doctor or health care professional as soon as possible: -blurred vision or other vision changes -chest pain -confusion -difficulty breathing -feelings of hostility or anger -muscle aches and pains -numbness or tingling in hands or feet -ringing in the ears -skin rash and itching -vomiting -weakness Side effects that usually do not require medical attention (report to your doctor or health care professional if they continue or are bothersome): -disturbed dreams, nightmares -headache -nausea -restlessness or nervousness -sore throat and nasal congestion -stomach upset This list may not describe all possible side effects. Call your doctor for medical advice about side effects. You may report side effects to FDA at 1-800-FDA-1088. Where should I keep my medicine? Keep out of the reach of children. Store at room temperature below 30 degrees C (86 degrees F). Protect from light. Keep container tightly closed. Throw away any unused medicine after the expiration date. NOTE: This sheet is a summary. It may not cover all possible information. If you have questions about this medicine, talk to your doctor, pharmacist, or health care provider.  2018 Elsevier/Gold Standard (2010-04-01 18:06:11)

## 2017-12-28 ENCOUNTER — Telehealth: Payer: Self-pay

## 2017-12-28 LAB — COMPREHENSIVE METABOLIC PANEL
ALK PHOS: 81 IU/L (ref 39–117)
ALT: 11 IU/L (ref 0–32)
AST: 11 IU/L (ref 0–40)
Albumin/Globulin Ratio: 1.8 (ref 1.2–2.2)
Albumin: 4.5 g/dL (ref 3.5–5.5)
BUN / CREAT RATIO: 29 — AB (ref 9–23)
BUN: 17 mg/dL (ref 6–20)
Bilirubin Total: 0.2 mg/dL (ref 0.0–1.2)
CO2: 23 mmol/L (ref 20–29)
CREATININE: 0.59 mg/dL (ref 0.57–1.00)
Calcium: 9 mg/dL (ref 8.7–10.2)
Chloride: 101 mmol/L (ref 96–106)
GFR calc non Af Amer: 131 mL/min/{1.73_m2} (ref >59)
GFR, EST AFRICAN AMERICAN: 152 mL/min/{1.73_m2} (ref >59)
GLUCOSE: 97 mg/dL (ref 65–99)
Globulin, Total: 2.5 g/dL (ref 1.5–4.5)
Potassium: 4.6 mmol/L (ref 3.5–5.2)
Sodium: 139 mmol/L (ref 134–144)
TOTAL PROTEIN: 7 g/dL (ref 6.0–8.5)

## 2017-12-28 LAB — TSH: TSH: 2.15 u[IU]/mL (ref 0.450–4.500)

## 2017-12-28 MED FILL — BUTALB-ACETAMIN-CAFF 50-325: 50-325-40 | 3 days supply | Qty: 30 | Fill #0

## 2017-12-28 NOTE — Telephone Encounter (Signed)
Tried to call patient no answer and no vm.

## 2017-12-28 NOTE — Telephone Encounter (Signed)
-----   Message from Massie MaroonLachina M Hollis, [place] sent at 12/28/2017 12:26 PM EDT ----- Regarding: lab results All labs are within a normal range. Follow up for pap smear and medication management in 1 month.   Nolon NationsLachina Moore Hollis  MSN, FNP-C Patient Care [facility] [address]  [place], [place] [address] [phone]

## 2018-01-01 NOTE — Telephone Encounter (Signed)
-----   Message from Massie Maroon, [place] sent at 12/28/2017 12:26 PM EDT ----- Regarding: lab results All labs are within a normal range. Follow up for pap smear and medication management in 1 month.   Nolon Nations  MSN, FNP-C Patient Care [facility] [address] [place], [place] [address] [phone]

## 2018-01-01 NOTE — Telephone Encounter (Signed)
Patient notified and will keep follow up.

## 2018-01-15 ENCOUNTER — Other Ambulatory Visit: Payer: Self-pay | Admitting: Family Medicine

## 2018-01-15 DIAGNOSIS — G44209 Tension-type headache, unspecified, not intractable: Secondary | ICD-10-CM

## 2018-01-15 MED ORDER — BUTALBITAL-APAP-CAFFEINE 50-325-40 MG PO TABS: 1.0000 | ORAL_TABLET | Freq: Four times a day (QID) | ORAL | 0 refills | Status: DC | PRN

## 2018-01-17 ENCOUNTER — Encounter: Payer: Self-pay | Admitting: Family Medicine

## 2018-01-17 MED FILL — BUTALB-ACETAMIN-CAFF 50-325: 50-325-40 | 3 days supply | Qty: 30 | Fill #0

## 2018-01-26 ENCOUNTER — Ambulatory Visit: Payer: Medicaid Other | Admitting: Family Medicine

## 2018-02-01 ENCOUNTER — Ambulatory Visit (INDEPENDENT_AMBULATORY_CARE_PROVIDER_SITE_OTHER): Payer: Self-pay | Admitting: Family Medicine

## 2018-02-01 ENCOUNTER — Encounter: Payer: Self-pay | Admitting: Family Medicine

## 2018-02-01 VITALS — BP 120/68 | HR 82 | Temp 97.9°F | Ht 69.0 in | Wt 243.0 lb

## 2018-02-01 DIAGNOSIS — Z01419 Encounter for gynecological examination (general) (routine) without abnormal findings: Secondary | ICD-10-CM

## 2018-02-01 DIAGNOSIS — Z09 Encounter for follow-up examination after completed treatment for conditions other than malignant neoplasm: Secondary | ICD-10-CM

## 2018-02-01 DIAGNOSIS — F4321 Adjustment disorder with depressed mood: Secondary | ICD-10-CM

## 2018-02-01 DIAGNOSIS — F411 Generalized anxiety disorder: Secondary | ICD-10-CM

## 2018-02-01 DIAGNOSIS — G44209 Tension-type headache, unspecified, not intractable: Secondary | ICD-10-CM

## 2018-02-01 MED ORDER — BUTALBITAL-APAP-CAFFEINE 50-325-40 MG PO TABS: 1.0000 | ORAL_TABLET | Freq: Four times a day (QID) | ORAL | 2 refills | Status: DC | PRN

## 2018-02-01 MED ORDER — BUTALBITAL-APAP-CAFFEINE 50-325-40 MG PO TABS: 1.0000 | ORAL_TABLET | Freq: Four times a day (QID) | ORAL | 0 refills | Status: DC | PRN

## 2018-02-01 MED ORDER — BUSPIRONE HCL 5 MG PO TABS: 5.0000 mg | ORAL_TABLET | Freq: Two times a day (BID) | ORAL | 5 refills | Status: DC

## 2018-02-01 MED FILL — BUTALB-ACETAMIN-CAFF 50-325: 50-325-40 | 7 days supply | Qty: 30 | Fill #0

## 2018-02-01 MED FILL — busPIRone HCL 5 MG TABS: 5 | 30 days supply | Qty: 60 | Fill #0

## 2018-02-01 NOTE — Patient Instructions (Addendum)
Preventing Cervical Cancer Cervical cancer is cancer that grows on the cervix. The cervix is at the bottom of the uterus. It connects the uterus to the vagina. The uterus is where a baby develops during pregnancy. Cancer occurs when cells become abnormal and start to grow out of control. Cervical cancer grows slowly and may not cause any symptoms at first. Over time, the cancer can grow deep into the cervix tissue and spread to other areas. If it is found early, cervical cancer can be treated effectively. You can also take steps to prevent this type of cancer. Most cases of cervical cancer are caused by an STI (sexually transmitted infection) called human papillomavirus (HPV). One way to reduce your risk of cervical cancer is to avoid infection with the HPV virus. You can do this by practicing safe sex and by getting the HPV vaccine. Getting regular Pap tests is also important because this can help identify changes in cells that could lead to cancer. Your chances of getting this disease can also be reduced by making certain lifestyle changes. How can I protect myself from cervical cancer? Preventing HPV infection  Ask your health care provider about getting the HPV vaccine. If you are 26 years old or younger, you may need to get this vaccine, which is given in three doses over 6 months. This vaccine protects against the types of HPV that could cause cancer.  Limit the number of people you have sex with. Also avoid having sex with people who have had many sex partners.  Use a latex condom during sex. Getting Pap tests  Get Pap tests regularly, starting at age 21. Talk with your health care provider about how often you need these tests. ? Most women who are 21?21 years of age should have a Pap test every 3 years. ? Most women who are 30?21 years of age should have a Pap test in combination with an HPV test every 5 years. ? Women with a higher risk of cervical cancer, such as those with a weakened  immune system or those who have been exposed to the drug diethylstilbestrol (DES), may need more frequent testing. Making other lifestyle changes  Do not use any products that contain nicotine or tobacco, such as cigarettes and e-cigarettes. If you need help quitting, ask your health care provider.  Eat at least 5 servings of fruits and vegetables every day.  Lose weight if you are overweight. Why are these changes important?  These changes and screening tests are designed to address the factors that are known to increase the risk of cervical cancer. Taking these steps is the best way to reduce your risk.  Having regular Pap tests will help identify changes in cells that could lead to cancer. Steps can then be taken to prevent cancer from developing.  These changes will also help find cervical cancer early. This type of cancer can be treated effectively if it is found early. It can be more dangerous and difficult to treat if cancer has grown deep into your cervix or has spread.  In addition to making you less likely to get cervical cancer, these changes will also provide other health benefits, such as the following: ? Practicing safe sex is important for preventing STIs and unplanned pregnancies. ? Avoiding tobacco can reduce your risk for other cancers and health issues. ? Eating a healthy diet and maintaining a healthy weight are good for your overall health. What can happen if changes are not made? In the   early stages, cervical cancer might not have any symptoms. It can take many years for the cancer to grow and get deep into the cervix tissue. This may be happening without you knowing about it. If you develop any symptoms, such as pelvic pain or unusual discharge or bleeding from your vagina, you should see your health care provider right away. If cervical cancer is not found early, you might need treatments such as radiation, chemotherapy, or surgery. In some cases, surgery may mean that  you will not be able to get pregnant or carry a pregnancy to term. Where to find support: Talk with your health care provider, school nurse, or local health department for guidance about screening and vaccination. Some children and teens may be able to get the HPV vaccine free of charge through the U.S. government's Vaccines for Children (VFC) program. Other places that provide vaccinations include:  Public health clinics. Check with your local health department.  Federally Qualified Health Centers, where you would pay only what you can afford. To find one near you, check this website: www.fqhc.org/find-an-fqhc/  [facility] Health Clinics. These are part of a program for Medicare and Medicaid patients who live in rural areas.  The National Breast and Cervical Cancer Early Detection Program also provides breast and cervical cancer screenings and diagnostic services to low-income, uninsured, and underinsured women. Cervical cancer can be passed down through families. Talk with your health care provider or genetic counselor to learn more about genetic testing for cancer. Where to find more information: Learn more about cervical cancer from:  American College of Gynecology: www.acog.org/Patients/FAQs/Cervical-Cancer  American Cancer Society: www.cancer.org/cancer/cervicalcancer/  U.S. Centers for Disease Control and Prevention: www.cdc.gov/cancer/cervical/  Summary  Talk with your health care provider about getting the HPV vaccine.  Be sure to get regular Pap tests as recommended by your health care provider.  See your health care provider right away if you have any pelvic pain or unusual discharge or bleeding from your vagina. This information is not intended to replace advice given to you by your health care provider. Make sure you discuss any questions you have with your health care provider. Document Released: 09/06/2015 Document Revised: 04/19/2016 Document Reviewed: 04/19/2016 Elsevier  Interactive Patient Education  2018 Elsevier Inc.  

## 2018-02-01 NOTE — Progress Notes (Signed)
pap Subjective:     Patient ID: Judith Terry, female   DOB: 1996-03-13, 22 y.o.   MRN: [id number]   PCP: Judith Ip, NP  Chief Complaint  Patient presents with  . Gynecologic Exam    HPI   Judith Terry has a history of GERD. She is here today her Pap Smear.   Current Status: Patient here for routine gynecological exam.  She has previously had one Pap smear.  Patient states that she has never had an abnormal Pap smear.  She is nullipar.  She is sexually active.  She denies abnormal vaginal discharge, vaginal  itching, vaginal burning, or dyspareunia.  Patient states that she does not perform monthly self breast exams. She does not have any family history of cancer that she knows of.  She typically follows a balanced diet but does not exercise routinely. Body mass index is 35.88.  Gynecologic History Patient's last menstrual period was 01/2018.  Obstetric History        OB History  Gravida Para Term Preterm AB Living  1 0 1 0 0 0  SAB TAB Ectopic Multiple Live Births   0 0 0 0 0    Her last menstrual period was the first of May. Her periods usually last 5-7 days. No discharge, discomfort, irregular bleeding, abdominal pain.  She is sexually active, and uses Nexplonon birth control. Inserted 2016, she is scheduled to have it removed the end of this year.   She denies recent infections, fevers, chills, weight loss, and night sweats. Denies cough, heart palpitations, chest pain, and shortness of breath. Denies any GI symptoms. Denies any bleeding episodes.   She denies pain today.   Past Medical History:  Diagnosis Date  . GERD (gastroesophageal reflux disease)     Family History  Problem Relation Age of Onset  . Diabetes Mother   . Diabetes Father   . Heart disease Father     Social History   Socioeconomic History  . Marital status: Single    Spouse name: Not on file  . Number of children: Not on file  . Years of education: Not on file  . Highest education  level: Not on file  Occupational History  . Not on file  Social Needs  . Financial resource strain: Not on file  . Food insecurity:    Worry: Not on file    Inability: Not on file  . Transportation needs:    Medical: Not on file    Non-medical: Not on file  Tobacco Use  . Smoking status: Never Smoker  . Smokeless tobacco: Never Used  Substance and Sexual Activity  . Alcohol use: No  . Drug use: No  . Sexual activity: Yes    Birth control/protection: None  Lifestyle  . Physical activity:    Days per week: Not on file    Minutes per session: Not on file  . Stress: Not on file  Relationships  . Social connections:    Talks on phone: Not on file    Gets together: Not on file    Attends religious service: Not on file    Active member of club or organization: Not on file    Attends meetings of clubs or organizations: Not on file    Relationship status: Not on file  . Intimate partner violence:    Fear of current or ex partner: Not on file    Emotionally abused: Not on file    Physically abused: Not on file  Forced sexual activity: Not on file  Other Topics Concern  . Not on file  Social History Narrative  . Not on file    Past Surgical History:  Procedure Laterality Date  . NO PAST SURGERIES     Immunization History  Administered Date(s) Administered  . HPV 9-valent 07/10/2015  . Influenza,inj,Quad PF,6+ Mos 11/25/2014, 05/06/2015, 07/17/2017  . Tdap 03/03/2015    Current Meds  Medication Sig  . busPIRone (BUSPAR) 5 MG tablet Take 1 tablet (5 mg total) by mouth 2 (two) times daily.  . butalbital-acetaminophen-caffeine (FIORICET, ESGIC) 50-325-40 MG tablet Take 1 tablet by mouth every 6 (six) hours as needed for headache.  . cyclobenzaprine (FLEXERIL) 10 MG tablet Take 1 tablet (10 mg total) by mouth 3 (three) times daily as needed for muscle spasms.  . [DISCONTINUED] busPIRone (BUSPAR) 5 MG tablet Take 1 tablet (5 mg total) by mouth 2 (two) times daily.  .  [DISCONTINUED] butalbital-acetaminophen-caffeine (FIORICET, ESGIC) 50-325-40 MG tablet Take 1-2 tablets by mouth every 6 (six) hours as needed for headache.  . [DISCONTINUED] butalbital-acetaminophen-caffeine (FIORICET, ESGIC) 50-325-40 MG tablet Take 1-2 tablets by mouth every 6 (six) hours as needed for headache.   No Known Allergies  BP 120/68 (BP Location: Left Arm, Patient Position: Sitting, Cuff Size: Large)   Pulse 82   Temp 97.9 F (36.6 C) (Oral)   Ht  (1.753 m)   Wt 243 lb (110.2 kg)   LMP 01/10/2018   SpO2 100%   BMI 35.88 kg/m    Review of Systems  HENT: Negative.   Eyes: Negative.   Respiratory: Negative.   Cardiovascular: Negative.   Gastrointestinal: Negative.   Endocrine: Negative.   Genitourinary: Negative.   Musculoskeletal: Negative.   Skin: Negative.   Allergic/Immunologic: Negative.   Neurological: Negative.   Hematological: Negative.   Psychiatric/Behavioral: Negative.    Objective:   Physical Exam  Constitutional: She is oriented to person, place, and time. She appears well-developed and well-nourished.  HENT:  Head: Normocephalic and atraumatic.  Right Ear: External ear normal.  Left Ear: External ear normal.  Nose: Nose normal.  Mouth/Throat: Oropharynx is clear and moist.  Eyes: Pupils are equal, round, and reactive to light. Conjunctivae and EOM are normal.  Neck: Normal range of motion. Neck supple.  Cardiovascular: Normal rate, regular rhythm, normal heart sounds and intact distal pulses.  Pulmonary/Chest: Effort normal and breath sounds normal.  Abdominal: Soft. Bowel sounds are normal.  Musculoskeletal: Normal range of motion.  Neurological: She is alert and oriented to person, place, and time.  Skin: Skin is warm and dry. Capillary refill takes less than 2 seconds.  Psychiatric: She has a normal mood and affect. Her behavior is normal. Judgment and thought content normal.  Nursing note and vitals reviewed.  Assessment:   Plan:    1. Encounter for cervical Pap smear with pelvic exam Recommend monthly self breast exam Recommend daily multivitamin for women Recommend strength training in 150 minutes of cardiovascular exercise per week   - Pap IG, CT/NG w/ reflex HPV when [facility])  2. Situational depression Stable.  - busPIRone (BUSPAR) 5 MG tablet; Take 1 tablet (5 mg total) by mouth 2 (two) times daily.  Dispense: 60 tablet; Refill: 5  3. Generalized anxiety disorder Stable.  - busPIRone (BUSPAR) 5 MG tablet; Take 1 tablet (5 mg total) by mouth 2 (two) times daily.  Dispense: 60 tablet; Refill: 5  4. Tension headache She continues to have occasional headaches. We  will refill her Fiorcet. Encouraged her to stay well hydrated.  - butalbital-acetaminophen-caffeine (FIORICET, ESGIC) 50-325-40 MG tablet; Take 1 tablet by mouth every 6 (six) hours as needed for headache.  Dispense: 30 tablet; Refill: 0  5. Follow up We will follow up with her in 2 months.   Meds ordered this encounter  Medications  . busPIRone (BUSPAR) 5 MG tablet    Sig: Take 1 tablet (5 mg total) by mouth 2 (two) times daily.    Dispense:  60 tablet    Refill:  5  . DISCONTD: butalbital-acetaminophen-caffeine (FIORICET, ESGIC) 50-325-40 MG tablet    Sig: Take 1-2 tablets by mouth every 6 (six) hours as needed for headache.    Dispense:  30 tablet    Refill:  2  . butalbital-acetaminophen-caffeine (FIORICET, ESGIC) 50-325-40 MG tablet    Sig: Take 1 tablet by mouth every 6 (six) hours as needed for headache.    Dispense:  30 tablet    Refill:  0    Dose reduction from 1-2 tablets every 6 hours daily, to 1 tablet every 6 hours daily.    Order Specific Question:   Supervising Provider    Answer:   Quentin Angst [[id number]]    Judith Ip,  MSN, FNP-BC [facility] [address] [place], [place] [address] [phone]

## 2018-02-02 ENCOUNTER — Telehealth: Payer: Self-pay

## 2018-02-02 NOTE — Telephone Encounter (Signed)
Patient notified of results.

## 2018-02-02 NOTE — Telephone Encounter (Signed)
-----   Message from Kallie Locks, FNP sent at 02/01/2018 10:26 AM EDT ----- Regarding: "Dose reduction of Migraine medication" Lyla Son,   Please call and inform patient the Fioricet (for migraines) has been decreased from 1-2 tablets, to 1 tablet every 6 hours as needed.   Thank you!  Natalie.

## 2018-02-02 NOTE — Telephone Encounter (Signed)
-----   Message from Natalie M Stroud, FNP sent at 02/01/2018 10:26 AM EDT ----- Regarding: "Dose reduction of Migraine medication" Carrie,   Please call and inform patient the Fioricet (for migraines) has been decreased from 1-2 tablets, to 1 tablet every 6 hours as needed.   Thank you!  Natalie. 

## 2018-02-02 NOTE — Telephone Encounter (Signed)
-----   Message from Kallie LocksNatalie M Stroud, FNP sent at 02/01/2018 10:26 AM EDT ----- Regarding: "Dose reduction of Migraine medication" Lyla SonCarrie,   Please call and inform patient the Fioricet (for migraines) has been decreased from 1-2 tablets, to 1 tablet every 6 hours as needed.   Thank you!  Natalie.

## 2018-02-07 LAB — PAP IG, CT-NG, RFX HPV ASCU
Chlamydia, Nuc. Acid Amp: NEGATIVE
Gonococcus by Nucleic Acid Amp: NEGATIVE
PAP Smear Comment: 0

## 2018-04-02 MED FILL — CYCLOBENZAPRINE 10 MG TAB: 10 | 4 days supply | Qty: 14 | Fill #0

## 2018-04-02 MED FILL — MELOXICAM 7.5 MG TABLET: 7.5 | 7 days supply | Qty: 14 | Fill #0

## 2018-04-03 ENCOUNTER — Ambulatory Visit (INDEPENDENT_AMBULATORY_CARE_PROVIDER_SITE_OTHER): Payer: Self-pay | Admitting: Family Medicine

## 2018-04-03 ENCOUNTER — Encounter: Payer: Self-pay | Admitting: Family Medicine

## 2018-04-03 VITALS — BP 130/70 | HR 98 | Temp 97.9°F | Ht 69.0 in | Wt 237.0 lb

## 2018-04-03 DIAGNOSIS — Z09 Encounter for follow-up examination after completed treatment for conditions other than malignant neoplasm: Secondary | ICD-10-CM

## 2018-04-03 DIAGNOSIS — F411 Generalized anxiety disorder: Secondary | ICD-10-CM

## 2018-04-03 DIAGNOSIS — G8929 Other chronic pain: Secondary | ICD-10-CM

## 2018-04-03 DIAGNOSIS — M546 Pain in thoracic spine: Secondary | ICD-10-CM

## 2018-04-03 DIAGNOSIS — F4321 Adjustment disorder with depressed mood: Secondary | ICD-10-CM

## 2018-04-03 DIAGNOSIS — N390 Urinary tract infection, site not specified: Secondary | ICD-10-CM

## 2018-04-03 DIAGNOSIS — Z0000 Encounter for general adult medical examination without abnormal findings: Secondary | ICD-10-CM

## 2018-04-03 DIAGNOSIS — G44209 Tension-type headache, unspecified, not intractable: Secondary | ICD-10-CM

## 2018-04-03 LAB — POCT URINALYSIS DIP (MANUAL ENTRY)
Bilirubin, UA: NEGATIVE
Blood, UA: NEGATIVE
Glucose, UA: NEGATIVE mg/dL
Ketones, POC UA: NEGATIVE mg/dL
Nitrite, UA: NEGATIVE
Protein Ur, POC: NEGATIVE mg/dL
Spec Grav, UA: 1.02 (ref 1.010–1.025)
Urobilinogen, UA: 1 E.U./dL
pH, UA: 6.5 (ref 5.0–8.0)

## 2018-04-03 NOTE — Progress Notes (Signed)
Follow Up  Subjective:    [facility]    DOB: 02/05/1996, 22 y.o.   MRN: [id number]   Chief Complaint  Patient presents with  . Follow-up    Anxiety, headache, back pain    HPI  Past Medical History:  Diagnosis Date  . GERD (gastroesophageal reflux disease)    Current Status: Since her last office visit, she is doing well with no complaints. She denies fevers, chills, fatigue, recent infections, weight loss, and night sweats. She has not had any headaches, visual changes, dizziness, and falls. No chest pain, heart palpitations, cough and shortness of breath reported. No reports of GI problems such as nausea, vomiting, diarrhea, and constipation. She has no reports of blood in stools, dysuria and hematuria. No depression or anxiety, and denies suicidal ideations, homicidal ideations, or auditory hallucinations. She denies pain today.   She is requesting to be tested for STDs today.   She is requesting to have Nexplanon Contraction to be removed at next office visit.    Past Medical History:  Diagnosis Date  . GERD (gastroesophageal reflux disease)     Family History  Problem Relation Age of Onset  . Diabetes Mother   . Diabetes Father   . Heart disease Father     Social History   Socioeconomic History  . Marital status: Single    Spouse name: Not on file  . Number of children: Not on file  . Years of education: Not on file  . Highest education level: Not on file  Occupational History  . Not on file  Social Needs  . Financial resource strain: Not on file  . Food insecurity:    Worry: Not on file    Inability: Not on file  . Transportation needs:    Medical: Not on file    Non-medical: Not on file  Tobacco Use  . Smoking status: Never Smoker  . Smokeless tobacco: Never Used  Substance and Sexual Activity  . Alcohol use: No  . Drug use: No  . Sexual activity: Yes    Birth control/protection: None  Lifestyle  . Physical activity:   Days per week: Not on file    Minutes per session: Not on file  . Stress: Not on file  Relationships  . Social connections:    Talks on phone: Not on file    Gets together: Not on file    Attends religious service: Not on file    Active member of club or organization: Not on file    Attends meetings of clubs or organizations: Not on file    Relationship status: Not on file  . Intimate partner violence:    Fear of current or ex partner: Not on file    Emotionally abused: Not on file    Physically abused: Not on file    Forced sexual activity: Not on file  Other Topics Concern  . Not on file  Social History Narrative  . Not on file    Past Surgical History:  Procedure Laterality Date  . NO PAST SURGERIES      Immunization History  Administered Date(s) Administered  . HPV 9-valent 07/10/2015  . Influenza,inj,Quad PF,6+ Mos 11/25/2014, 05/06/2015, 07/17/2017  . Tdap 03/03/2015    Current Meds  Medication Sig  . busPIRone (BUSPAR) 10 MG tablet Take 1 tablet (10 mg total) by mouth 2 (two) times daily.  . butalbital-acetaminophen-caffeine (FIORICET, ESGIC) 50-325-40 MG tablet Take 1-2 tablets by mouth every 6 (six)  hours as needed for headache.  . cyclobenzaprine (FLEXERIL) 10 MG tablet Take 1 tablet (10 mg total) by mouth 3 (three) times daily as needed for muscle spasms.  . diphenhydrAMINE (BENADRYL) 25 mg capsule Take 25 mg by mouth every 6 (six) hours as needed for itching.  . meloxicam (MOBIC) 7.5 MG tablet Take 7.5 mg by mouth 2 (two) times daily.  Marland Kitchen omeprazole (PRILOSEC OTC) 20 MG tablet Take 20 mg by mouth daily.  . [DISCONTINUED] busPIRone (BUSPAR) 5 MG tablet Take 1 tablet (5 mg total) by mouth 2 (two) times daily.  . [DISCONTINUED] butalbital-acetaminophen-caffeine (FIORICET, ESGIC) 50-325-40 MG tablet Take 1 tablet by mouth every 6 (six) hours as needed for headache.  . [DISCONTINUED] butalbital-acetaminophen-caffeine (FIORICET, ESGIC) 50-325-40 MG tablet Take 1-2  tablets by mouth every 6 (six) hours as needed for headache.    No Known Allergies  BP 130/70 (BP Location: Left Arm, Patient Position: Sitting, Cuff Size: Large)   Pulse 98   Temp 97.9 F (36.6 C) (Oral)   Ht 5\' 9"  (1.753 m)   Wt 237 lb (107.5 kg)   LMP 03/13/2018   SpO2 100%   BMI 35.00 kg/m   Review of Systems  Constitutional: Negative.   HENT: Negative.   Eyes: Negative.   Respiratory: Negative.   Cardiovascular: Negative.   Gastrointestinal: Negative.   Endocrine: Negative.   Genitourinary: Negative.   Musculoskeletal: Negative.   Skin: Negative.   Allergic/Immunologic: Negative.   Neurological: Positive for headaches.  Hematological: Negative.   Psychiatric/Behavioral: Negative.    Objective:   Physical Exam  Constitutional: She is oriented to person, place, and time. She appears well-developed and well-nourished.  HENT:  Head: Normocephalic and atraumatic.  Right Ear: External ear normal.  Left Ear: External ear normal.  Nose: Nose normal.  Mouth/Throat: Oropharynx is clear and moist.  Eyes: Pupils are equal, round, and reactive to light. Conjunctivae and EOM are normal.  Neck: Normal range of motion. Neck supple.  Cardiovascular: Normal rate, regular rhythm, normal heart sounds and intact distal pulses.  Pulmonary/Chest: Effort normal and breath sounds normal.  Abdominal: Soft. Bowel sounds are normal.  Musculoskeletal: Normal range of motion.  Neurological: She is alert and oriented to person, place, and time.  Skin: Skin is warm and dry. Capillary refill takes less than 2 seconds.  Psychiatric: She has a normal mood and affect. Her behavior is normal. Judgment and thought content normal.   Assessment & Plan:   1. Chronic bilateral thoracic back pain Stable. Not worsening.  - POCT urinalysis dipstick  2. Tension headache Stable today.  - butalbital-acetaminophen-caffeine (FIORICET, ESGIC) 50-325-40 MG tablet; Take 1-2 tablets by mouth every 6 (six)  hours as needed for headache.  Dispense: 30 tablet; Refill: 2  3. Situational depression Stable. We will increase dosage today.  - busPIRone (BUSPAR) 10 MG tablet; Take 1 tablet (10 mg total) by mouth 2 (two) times daily.  Dispense: 60 tablet; Refill: 2  4. Generalized anxiety disorder Buspar 5 mg BID not effective. We will increase dose today.  - busPIRone (BUSPAR) 10 MG tablet; Take 1 tablet (10 mg total) by mouth 2 (two) times daily.  Dispense: 60 tablet; Refill: 2  5. Health care maintenance Requests to be tested for STDs today.  - GC/Chlamydia Probe Amp([facility]); Future - HIV antibody (with reflex)  6. UTI Rx for Septra to pharmacy today.   7. Follow up She will follow up in 2 months.   Meds ordered this encounter  Medications  .  DISCONTD: butalbital-acetaminophen-caffeine (FIORICET, ESGIC) 50-325-40 MG tablet    Sig: Take 1-2 tablets by mouth every 6 (six) hours as needed for headache.    Dispense:  30 tablet    Refill:  2    Dose reduction from 1-2 tablets every 6 hours daily, to 1 tablet every 6 hours daily.  . busPIRone (BUSPAR) 10 MG tablet    Sig: Take 1 tablet (10 mg total) by mouth 2 (two) times daily.    Dispense:  60 tablet    Refill:  2  . butalbital-acetaminophen-caffeine (FIORICET, ESGIC) 50-325-40 MG tablet    Sig: Take 1-2 tablets by mouth every 6 (six) hours as needed for headache.    Dispense:  30 tablet    Refill:  2  . sulfamethoxazole-trimethoprim (BACTRIM DS,SEPTRA DS) 800-160 MG tablet    Sig: Take 1 tablet by mouth 2 (two) times daily.    Dispense:  14 tablet    Refill:  0   Raliegh Ip,  MSN, FNP-C [facility] [address] [place], [place] [address] [phone]

## 2018-04-04 MED ORDER — BUTALBITAL-APAP-CAFFEINE 50-325-40 MG PO TABS: 1.0000 | ORAL_TABLET | Freq: Four times a day (QID) | ORAL | 2 refills | Status: DC | PRN

## 2018-04-04 MED ORDER — BUSPIRONE HCL 10 MG PO TABS: 10.0000 mg | ORAL_TABLET | Freq: Two times a day (BID) | ORAL | 2 refills | Status: DC

## 2018-04-04 MED ORDER — SULFAMETHOXAZOLE-TRIMETHOPRIM 800-160 MG PO TABS: 1.0000 | ORAL_TABLET | Freq: Two times a day (BID) | ORAL | 0 refills | Status: DC

## 2018-04-05 ENCOUNTER — Telehealth: Payer: Self-pay

## 2018-04-05 MED FILL — busPIRone HCL 10 MG TABS: 10 | 30 days supply | Qty: 60 | Fill #0

## 2018-04-05 MED FILL — BUTALB-ACETAMIN-CAFF 50-325: 50-325-40 | 3 days supply | Qty: 30 | Fill #0

## 2018-04-05 MED FILL — SULFAMETHOXAZOLE-TMP DS TAB: 800-160 | 7 days supply | Qty: 14 | Fill #0

## 2018-04-05 NOTE — Telephone Encounter (Signed)
-----   Message from Natalie M Stroud, FNP sent at 04/04/2018  9:49 PM EDT ----- Regarding: "Changes in Rx" Carrie,   Please contact patient and inform her that Rxs for increased dosage of Buspar to 10 mg BID and Fiorcet to 1-2 tablets was sent to pharmacy today.   Thanks.  

## 2018-04-05 NOTE — Telephone Encounter (Signed)
Left a vm for patient to callback 

## 2018-04-06 ENCOUNTER — Encounter: Payer: Self-pay | Admitting: Family Medicine

## 2018-04-06 NOTE — Telephone Encounter (Signed)
-----   Message from Kallie LocksNatalie M Stroud, FNP sent at 04/04/2018  9:55 PM EDT ----- Regarding: "Testing for STDs" Carrie,   Please tell patient to come in as soon as she can to be tested for STDs, as she requested, but forget to get yesterday at her office visit.  Order is in.   Thanks.

## 2018-04-06 NOTE — Telephone Encounter (Signed)
-----   Message from Kallie LocksNatalie M Stroud, FNP sent at 04/04/2018  9:49 PM EDT ----- Regarding: "Changes in Rx" Lyla SonCarrie,   Please contact patient and inform her that Rxs for increased dosage of Buspar to 10 mg BID and Fiorcet to 1-2 tablets was sent to pharmacy today.   Thanks.

## 2018-04-06 NOTE — Telephone Encounter (Signed)
Left a vm for patient to callback 

## 2018-04-06 NOTE — Telephone Encounter (Signed)
Patient notified

## 2018-04-06 NOTE — Telephone Encounter (Signed)
-----   Message from Natalie M Stroud, FNP sent at 04/04/2018  9:49 PM EDT ----- Regarding: "Changes in Rx" Carrie,   Please contact patient and inform her that Rxs for increased dosage of Buspar to 10 mg BID and Fiorcet to 1-2 tablets was sent to pharmacy today.   Thanks.  

## 2018-04-30 ENCOUNTER — Other Ambulatory Visit: Payer: Self-pay | Admitting: Family Medicine

## 2018-05-01 MED ORDER — CYCLOBENZAPRINE HCL 10 MG PO TABS: 10.0000 mg | ORAL_TABLET | Freq: Three times a day (TID) | ORAL | 0 refills | Status: DC | PRN

## 2018-05-10 MED FILL — CYCLOBENZAPRINE 10 MG TAB: 10 | 10 days supply | Qty: 30 | Fill #0

## 2018-05-10 MED FILL — BUTALB-ACETAMIN-CAFF 50-325: 50-325-40 | 3 days supply | Qty: 30 | Fill #1

## 2018-06-06 ENCOUNTER — Encounter: Payer: Self-pay | Admitting: Family Medicine

## 2018-06-06 ENCOUNTER — Ambulatory Visit (INDEPENDENT_AMBULATORY_CARE_PROVIDER_SITE_OTHER): Payer: Self-pay | Admitting: Family Medicine

## 2018-06-06 VITALS — BP 126/78 | HR 98 | Temp 97.9°F | Ht 69.0 in | Wt 237.0 lb

## 2018-06-06 DIAGNOSIS — G8929 Other chronic pain: Secondary | ICD-10-CM

## 2018-06-06 DIAGNOSIS — Z09 Encounter for follow-up examination after completed treatment for conditions other than malignant neoplasm: Secondary | ICD-10-CM

## 2018-06-06 DIAGNOSIS — M546 Pain in thoracic spine: Secondary | ICD-10-CM

## 2018-06-06 DIAGNOSIS — Z3046 Encounter for surveillance of implantable subdermal contraceptive: Secondary | ICD-10-CM

## 2018-06-06 DIAGNOSIS — F411 Generalized anxiety disorder: Secondary | ICD-10-CM

## 2018-06-06 DIAGNOSIS — G44209 Tension-type headache, unspecified, not intractable: Secondary | ICD-10-CM

## 2018-06-06 DIAGNOSIS — Z23 Encounter for immunization: Secondary | ICD-10-CM

## 2018-06-06 MED ORDER — BUTALBITAL-APAP-CAFFEINE 50-325-40 MG PO TABS: 1.0000 | ORAL_TABLET | Freq: Four times a day (QID) | ORAL | 2 refills | Status: DC | PRN

## 2018-06-06 MED FILL — BUTALB-ACETAMIN-CAFF 50-325: 50-325-40 | 3 days supply | Qty: 30 | Fill #0

## 2018-06-06 NOTE — Progress Notes (Signed)
Follow Up  Subjective:    Patient ID: [facility] [place], female    DOB: 08/22/1996, 22 y.o.   MRN: [id number]   Chief Complaint  Patient presents with  . Nexplanon removal   HPI  Judith Terry is a 22 year old female with a past medical history GERD, Anxiety, Headache, Chronic Back Pain. She is here for follow up and Nexplanon removal today.   Current Status: Since her last office visit, she is doing well with no complaints. She is to have Nexplanon removed from her left arm today. He has not had any complaints of pain or discomfort in left arm where Nexplanon was inserted 3 years ago. Her anxiety is stable today.   She denies fevers, chills, fatigue, recent infections, weight loss, and night sweats. She has not had any headaches, visual changes, dizziness, and falls. No chest pain, heart palpitations, cough and shortness of breath reported. No reports of GI problems such as nausea, vomiting, diarrhea, and constipation. She has no reports of blood in stools, dysuria and hematuria. No depression or anxiety reported. She denies pain today.   Past Medical History:  Diagnosis Date  . Anxiety   . Chronic back pain   . GERD (gastroesophageal reflux disease)   . Headache      Family History  Problem Relation Age of Onset  . Diabetes Mother   . Diabetes Father   . Heart disease Father     Social History   Socioeconomic History  . Marital status: Single    Spouse name: Not on file  . Number of children: Not on file  . Years of education: Not on file  . Highest education level: Not on file  Occupational History  . Not on file  Social Needs  . Financial resource strain: Not on file  . Food insecurity:    Worry: Not on file    Inability: Not on file  . Transportation needs:    Medical: Not on file    Non-medical: Not on file  Tobacco Use  . Smoking status: Never Smoker  . Smokeless tobacco: Never Used  Substance and Sexual Activity  . Alcohol use: No  . Drug use: No  . Sexual  activity: Yes    Birth control/protection: None  Lifestyle  . Physical activity:    Days per week: Not on file    Minutes per session: Not on file  . Stress: Not on file  Relationships  . Social connections:    Talks on phone: Not on file    Gets together: Not on file    Attends religious service: Not on file    Active member of club or organization: Not on file    Attends meetings of clubs or organizations: Not on file    Relationship status: Not on file  . Intimate partner violence:    Fear of current or ex partner: Not on file    Emotionally abused: Not on file    Physically abused: Not on file    Forced sexual activity: Not on file  Other Topics Concern  . Not on file  Social History Narrative  . Not on file    Past Surgical History:  Procedure Laterality Date  . NO PAST SURGERIES      Immunization History  Administered Date(s) Administered  . HPV 9-valent 07/10/2015  . Influenza,inj,Quad PF,6+ Mos 11/25/2014, 05/06/2015, 07/17/2017, 06/06/2018  . Tdap 03/03/2015    Current Meds  Medication Sig  . busPIRone (BUSPAR) 10 MG  tablet Take 1 tablet (10 mg total) by mouth 2 (two) times daily.  . butalbital-acetaminophen-caffeine (FIORICET, ESGIC) 50-325-40 MG tablet Take 1-2 tablets by mouth every 6 (six) hours as needed for headache.  . cyclobenzaprine (FLEXERIL) 10 MG tablet Take 1 tablet (10 mg total) by mouth 3 (three) times daily as needed for muscle spasms.  . hpv vaccine (GARDASIL) injection Inject 0.5 mLs into the muscle once.  . [DISCONTINUED] butalbital-acetaminophen-caffeine (FIORICET, ESGIC) 50-325-40 MG tablet Take 1-2 tablets by mouth every 6 (six) hours as needed for headache.    No Known Allergies  BP 126/78 (BP Location: Left Arm, Patient Position: Sitting, Cuff Size: Large)   Pulse 98   Temp 97.9 F (36.6 C) (Oral)   Ht 5\' 9"  (1.753 m)   Wt 237 lb (107.5 kg)   LMP 05/15/2018   SpO2 100%   BMI 35.00 kg/m      Review of Systems  HENT:  Negative.   Respiratory: Negative.   Cardiovascular: Negative.   Gastrointestinal: Negative.   Genitourinary: Negative.   Musculoskeletal: Negative.   Skin: Negative.   Psychiatric/Behavioral: Negative.    Objective:   Physical Exam  Constitutional: She is oriented to person, place, and time. She appears well-developed and well-nourished.  HENT:  Head: Normocephalic and atraumatic.  Neck: Normal range of motion. Neck supple.  Cardiovascular: Normal rate, regular rhythm, normal heart sounds and intact distal pulses.  Pulmonary/Chest: Effort normal and breath sounds normal.  Abdominal: Soft. Bowel sounds are normal.  Musculoskeletal: Normal range of motion.  Neurological: She is alert and oriented to person, place, and time.  Skin: Skin is warm and dry.     Psychiatric: She has a normal mood and affect. Her behavior is normal. Judgment and thought content normal.  Nursing note and vitals reviewed.  Assessment & Plan:   1. Nexplanon removal Removal is successful. Patient tolerated procedure well with no complaints. She will return at a later date when she reconsiders a future method of birth control. Monitor.   2. Chronic bilateral thoracic back pain Stable.   3. Tension headache Stable. Continue med as prescribed.  - butalbital-acetaminophen-caffeine (FIORICET, ESGIC) 50-325-40 MG tablet; Take 1-2 tablets by mouth every 6 (six) hours as needed for headache.  Dispense: 30 tablet; Refill: 2  4. Generalized anxiety disorder Buspar is effective. Continue medication as prescribed.  5. Need for immunization against influenza - Flu Vaccine QUAD 36+ mos IM  6. Follow up She will follow up in 3 months.   Meds ordered this encounter  Medications  . butalbital-acetaminophen-caffeine (FIORICET, ESGIC) 50-325-40 MG tablet    Sig: Take 1-2 tablets by mouth every 6 (six) hours as needed for headache.    Dispense:  30 tablet    Refill:  2   Raliegh Ip,  MSN, FNP-C Patient  [facility] [address] [place], [place] [address] [phone]

## 2018-06-13 ENCOUNTER — Other Ambulatory Visit: Payer: Medicaid Other

## 2018-06-21 ENCOUNTER — Other Ambulatory Visit: Payer: Medicaid Other

## 2018-06-21 DIAGNOSIS — Z0000 Encounter for general adult medical examination without abnormal findings: Secondary | ICD-10-CM

## 2018-06-23 LAB — GC/CHLAMYDIA PROBE AMP
Chlamydia trachomatis, NAA: NEGATIVE
Neisseria gonorrhoeae by PCR: NEGATIVE

## 2018-06-26 DIAGNOSIS — F4321 Adjustment disorder with depressed mood: Secondary | ICD-10-CM

## 2018-06-26 DIAGNOSIS — F411 Generalized anxiety disorder: Secondary | ICD-10-CM

## 2018-06-26 DIAGNOSIS — G44209 Tension-type headache, unspecified, not intractable: Secondary | ICD-10-CM

## 2018-06-27 ENCOUNTER — Other Ambulatory Visit: Payer: Self-pay

## 2018-06-27 DIAGNOSIS — G44209 Tension-type headache, unspecified, not intractable: Secondary | ICD-10-CM

## 2018-06-27 DIAGNOSIS — F4321 Adjustment disorder with depressed mood: Secondary | ICD-10-CM

## 2018-06-27 DIAGNOSIS — F411 Generalized anxiety disorder: Secondary | ICD-10-CM

## 2018-06-29 ENCOUNTER — Other Ambulatory Visit: Payer: Self-pay | Admitting: Family Medicine

## 2018-06-29 DIAGNOSIS — G44209 Tension-type headache, unspecified, not intractable: Secondary | ICD-10-CM

## 2018-06-29 MED ORDER — BUSPIRONE HCL 10 MG PO TABS
10.0000 mg | ORAL_TABLET | Freq: Two times a day (BID) | ORAL | 2 refills | Status: DC
Start: 2018-06-29 — End: 2018-09-19

## 2018-06-29 MED ORDER — BUTALBITAL-APAP-CAFFEINE 50-325-40 MG PO TABS: 1.0000 | ORAL_TABLET | Freq: Four times a day (QID) | ORAL | 2 refills | Status: DC | PRN

## 2018-06-29 NOTE — Telephone Encounter (Signed)
-----   Message from Kallie Locks, FNP sent at 06/26/2018 10:41 PM EDT ----- Regarding: "Swab Results" Inform patient that Swab results are negative for STDs. Keep follow up appointment as scheduled.  Thank you.

## 2018-06-29 NOTE — Progress Notes (Signed)
Reprinted and signed

## 2018-07-02 MED FILL — BUTALB-ACETAMIN-CAFF 50-325: 50-325-40 | 3 days supply | Qty: 30 | Fill #1

## 2018-07-02 MED FILL — busPIRone HCL 10 MG TABS: 10 | 30 days supply | Qty: 60 | Fill #1

## 2018-07-03 NOTE — Telephone Encounter (Signed)
-----   Message from Natalie M Stroud, FNP sent at 06/26/2018 10:41 PM EDT ----- Regarding: "Swab Results" Inform patient that Swab results are negative for STDs. Keep follow up appointment as scheduled.  Thank you.  

## 2018-07-31 ENCOUNTER — Encounter: Payer: Self-pay | Admitting: Family Medicine

## 2018-07-31 ENCOUNTER — Ambulatory Visit (INDEPENDENT_AMBULATORY_CARE_PROVIDER_SITE_OTHER): Payer: Self-pay | Admitting: Family Medicine

## 2018-07-31 VITALS — BP 126/78 | HR 84 | Temp 98.0°F | Ht 69.0 in | Wt 234.0 lb

## 2018-07-31 DIAGNOSIS — R300 Dysuria: Secondary | ICD-10-CM

## 2018-07-31 DIAGNOSIS — F411 Generalized anxiety disorder: Secondary | ICD-10-CM

## 2018-07-31 DIAGNOSIS — Z09 Encounter for follow-up examination after completed treatment for conditions other than malignant neoplasm: Secondary | ICD-10-CM

## 2018-07-31 DIAGNOSIS — R319 Hematuria, unspecified: Secondary | ICD-10-CM

## 2018-07-31 DIAGNOSIS — G44209 Tension-type headache, unspecified, not intractable: Secondary | ICD-10-CM

## 2018-07-31 DIAGNOSIS — N898 Other specified noninflammatory disorders of vagina: Secondary | ICD-10-CM

## 2018-07-31 DIAGNOSIS — R8290 Unspecified abnormal findings in urine: Secondary | ICD-10-CM

## 2018-07-31 DIAGNOSIS — N390 Urinary tract infection, site not specified: Secondary | ICD-10-CM

## 2018-07-31 LAB — POCT URINALYSIS DIP (MANUAL ENTRY)
Glucose, UA: NEGATIVE mg/dL
Ketones, POC UA: NEGATIVE mg/dL
Nitrite, UA: NEGATIVE
Protein Ur, POC: 100 mg/dL — AB
Spec Grav, UA: >=1.03 — AB (ref 1.010–1.025)
Urobilinogen, UA: 1 E.U./dL
pH, UA: 7 (ref 5.0–8.0)

## 2018-07-31 MED ORDER — BUTALBITAL-APAP-CAFFEINE 50-325-40 MG PO TABS: 1.0000 | ORAL_TABLET | Freq: Four times a day (QID) | ORAL | 2 refills | Status: DC | PRN

## 2018-07-31 MED ORDER — SULFAMETHOXAZOLE-TRIMETHOPRIM 800-160 MG PO TABS: 1.0000 | ORAL_TABLET | Freq: Two times a day (BID) | ORAL | 0 refills | Status: DC

## 2018-07-31 MED FILL — BUTALB-ACETAMIN-CAFF 50-325: 50-325-40 | 3 days supply | Qty: 30 | Fill #0

## 2018-07-31 MED FILL — SULFAMETHOXAZOLE-TMP DS TAB: 800-160 | 7 days supply | Qty: 14 | Fill #0

## 2018-07-31 NOTE — Patient Instructions (Signed)
Sulfamethoxazole; Trimethoprim, SMX-TMP tablets What is this medicine? SULFAMETHOXAZOLE; TRIMETHOPRIM or SMX-TMP (suhl fuh meth OK suh zohl; trye METH oh prim) is a combination of a sulfonamide antibiotic and a second antibiotic, trimethoprim. It is used to treat or prevent certain kinds of bacterial infections. It will not work for colds, flu, or other viral infections. This medicine may be used for other purposes; ask your health care provider or pharmacist if you have questions. COMMON BRAND NAME(S): Bacter-Aid DS, Bactrim, Bactrim DS, Septra, Septra DS What should I tell my health care provider before I take this medicine? They need to know if you have any of these conditions: -anemia -asthma -being treated with anticonvulsants -if you frequently drink alcohol containing drinks -kidney disease -liver disease -low level of folic acid or glucose-6-phosphate dehydrogenase -poor nutrition or malabsorption -porphyria -severe allergies -thyroid disorder -an unusual or allergic reaction to sulfamethoxazole, trimethoprim, sulfa drugs, other medicines, foods, dyes, or preservatives -pregnant or trying to get pregnant -breast-feeding How should I use this medicine? Take this medicine by mouth with a full glass of water. Follow the directions on the prescription label. Take your medicine at regular intervals. Do not take it more often than directed. Do not skip doses or stop your medicine early. Talk to your pediatrician regarding the use of this medicine in children. Special care may be needed. This medicine has been used in children as young as 2 months of age. Overdosage: If you think you have taken too much of this medicine contact a poison control center or emergency room at once. NOTE: This medicine is only for you. Do not share this medicine with others. What if I miss a dose? If you miss a dose, take it as soon as you can. If it is almost time for your next dose, take only that dose. Do  not take double or extra doses. What may interact with this medicine? Do not take this medicine with any of the following medications: -aminobenzoate potassium -dofetilide -metronidazole This medicine may also interact with the following medications: -ACE inhibitors like benazepril, enalapril, lisinopril, and ramipril -birth control pills -cyclosporine -digoxin -diuretics -indomethacin -medicines for diabetes -methenamine -methotrexate -phenytoin -potassium supplements -pyrimethamine -sulfinpyrazone -tricyclic antidepressants -warfarin This list may not describe all possible interactions. Give your health care provider a list of all the medicines, herbs, non-prescription drugs, or dietary supplements you use. Also tell them if you smoke, drink alcohol, or use illegal drugs. Some items may interact with your medicine. What should I watch for while using this medicine? Tell your doctor or health care professional if your symptoms do not improve. Drink several glasses of water a day to reduce the risk of kidney problems. Do not treat diarrhea with over the counter products. Contact your doctor if you have diarrhea that lasts more than 2 days or if it is severe and watery. This medicine can make you more sensitive to the sun. Keep out of the sun. If you cannot avoid being in the sun, wear protective clothing and use a sunscreen. Do not use sun lamps or tanning beds/booths. What side effects may I notice from receiving this medicine? Side effects that you should report to your doctor or health care professional as soon as possible: -allergic reactions like skin rash or hives, swelling of the face, lips, or tongue -breathing problems -fever or chills, sore throat -irregular heartbeat, chest pain -joint or muscle pain -pain or difficulty passing urine -red pinpoint spots on skin -redness, blistering, peeling or loosening of   the skin, including inside the mouth -unusual bleeding or  bruising -unusually weak or tired -yellowing of the eyes or skin Side effects that usually do not require medical attention (report to your doctor or health care professional if they continue or are bothersome): -diarrhea -dizziness -headache -loss of appetite -nausea, vomiting -nervousness This list may not describe all possible side effects. Call your doctor for medical advice about side effects. You may report side effects to FDA at 1-800-FDA-1088. Where should I keep my medicine? Keep out of the reach of children. Store at room temperature between 20 to 25 degrees C (68 to 77 degrees F). Protect from light. Throw away any unused medicine after the expiration date. NOTE: This sheet is a summary. It may not cover all possible information. If you have questions about this medicine, talk to your doctor, pharmacist, or health care provider.  2018 Elsevier/Gold Standard (2013-03-29 14:38:26)  

## 2018-07-31 NOTE — Progress Notes (Signed)
Sick Visit  Subjective:    Patient ID: Judith Terry, female    DOB: Oct 04, 1995, 22 y.o.   MRN: [id number]   Chief Complaint  Patient presents with  . Vaginal Discharge     HPI  [name] is a 22 year old female with a past medical history of Headache, GERD, Chronic Back Pain, and Anxiety. She is here today for a sick visit.    Current Status: Since her last office visit, she reports vaginal discharge, cloudy urine, odor, and irritation for about 2 weeks. She has been using OTC products and they have not been effective. No reports of GI problems such as nausea, vomiting, diarrhea, and constipation. She has no reports of blood in stools, dysuria and hematuria.  She denies fevers, chills, fatigue, recent infections, weight loss, and night sweats. She has not had any visual changes, and falls. No chest pain, heart palpitations, cough and shortness of breath reported.  No depression or anxiety reported.   Past Medical History:  Diagnosis Date  . Anxiety   . Chronic back pain   . GERD (gastroesophageal reflux disease)   . Headache     Family History  Problem Relation Age of Onset  . Diabetes Mother   . Diabetes Father   . Heart disease Father     Social History   Socioeconomic History  . Marital status: Single    Spouse name: Not on file  . Number of children: Not on file  . Years of education: Not on file  . Highest education level: Not on file  Occupational History  . Not on file  Social Needs  . Financial resource strain: Not on file  . Food insecurity:    Worry: Not on file    Inability: Not on file  . Transportation needs:    Medical: Not on file    Non-medical: Not on file  Tobacco Use  . Smoking status: Never Smoker  . Smokeless tobacco: Never Used  Substance and Sexual Activity  . Alcohol use: No  . Drug use: No  . Sexual activity: Yes    Birth control/protection: None  Lifestyle  . Physical activity:    Days per week: Not on file    Minutes per  session: Not on file  . Stress: Not on file  Relationships  . Social connections:    Talks on phone: Not on file    Gets together: Not on file    Attends religious service: Not on file    Active member of club or organization: Not on file    Attends meetings of clubs or organizations: Not on file    Relationship status: Not on file  . Intimate partner violence:    Fear of current or ex partner: Not on file    Emotionally abused: Not on file    Physically abused: Not on file    Forced sexual activity: Not on file  Other Topics Concern  . Not on file  Social History Narrative  . Not on file    Past Surgical History:  Procedure Laterality Date  . NO PAST SURGERIES      Immunization History  Administered Date(s) Administered  . HPV 9-valent 07/10/2015  . Influenza,inj,Quad PF,6+ Mos 11/25/2014, 05/06/2015, 07/17/2017, 06/06/2018  . Tdap 03/03/2015    Current Meds  Medication Sig  . busPIRone (BUSPAR) 10 MG tablet Take 1 tablet (10 mg total) by mouth 2 (two) times daily.  . butalbital-acetaminophen-caffeine (FIORICET, ESGIC) 50-325-40 MG tablet Take  1-2 tablets by mouth every 6 (six) hours as needed for headache.  . cyclobenzaprine (FLEXERIL) 10 MG tablet Take 1 tablet (10 mg total) by mouth 3 (three) times daily as needed for muscle spasms.  . diphenhydrAMINE (BENADRYL) 25 mg capsule Take 25 mg by mouth every 6 (six) hours as needed for itching.  . hpv vaccine (GARDASIL) injection Inject 0.5 mLs into the muscle once.  Marland Kitchen. omeprazole (PRILOSEC OTC) 20 MG tablet Take 20 mg by mouth daily.    No Known Allergies  BP 126/78 (BP Location: Left Arm, Patient Position: Sitting, Cuff Size: Large)   Pulse 84   Temp 98 F (36.7 C) (Oral)   Ht 5\' 9"  (1.753 m)   Wt 234 lb (106.1 kg)   LMP 07/08/2018   SpO2 100%   BMI 34.56 kg/m    Review of Systems  Constitutional: Negative.   HENT: Negative.   Eyes: Negative.   Respiratory: Negative.   Cardiovascular: Negative.    Endocrine: Negative.   Genitourinary: Positive for dysuria and vaginal discharge.  Musculoskeletal: Negative.   Skin: Negative.   Neurological: Positive for dizziness and headaches.  Hematological: Negative.   Psychiatric/Behavioral: Negative.    Objective:   Physical Exam  Constitutional: She is oriented to person, place, and time. She appears well-developed and well-nourished.  HENT:  Head: Normocephalic and atraumatic.  Neck: Normal range of motion. Neck supple.  Cardiovascular: Normal rate, regular rhythm, normal heart sounds and intact distal pulses.  Pulmonary/Chest: Breath sounds normal.  Abdominal: Soft. Bowel sounds are normal.  Musculoskeletal: Normal range of motion.  Neurological: She is alert and oriented to person, place, and time.  Skin: Skin is warm and dry.  Psychiatric: She has a normal mood and affect. Her behavior is normal. Judgment and thought content normal.  Nursing note and vitals reviewed.  Assessment & Plan:   1. Vaginal discharge Swab results are pending.  - POCT urinalysis dipstick - NuSwab Vaginitis Plus (VG+)  2. Dysuria - POCT urinalysis dipstick - NuSwab Vaginitis Plus (VG+)  3. Generalized anxiety disorder Improved. She continues Buspar as prescribed.  4. Urinary tract infection with hematuria, site unspecified We will initiate Septra today.  - sulfamethoxazole-trimethoprim (BACTRIM DS,SEPTRA DS) 800-160 MG tablet; Take 1 tablet by mouth 2 (two) times daily.  Dispense: 14 tablet; Refill: 0  5. Follow up She will keep previously scheduled follow up appointment 09/2018.  Meds ordered this encounter  Medications  . sulfamethoxazole-trimethoprim (BACTRIM DS,SEPTRA DS) 800-160 MG tablet    Sig: Take 1 tablet by mouth 2 (two) times daily.    Dispense:  14 tablet    Refill:  0    Judith IpNatalie Tyrina Hines,  MSN, FNP-C Patient [facility] [address]  [place], [place] [id number] [phone]

## 2018-08-02 LAB — URINE CULTURE

## 2018-08-07 ENCOUNTER — Telehealth: Payer: Self-pay

## 2018-08-07 LAB — NUSWAB VAGINITIS PLUS (VG+)
Candida albicans, NAA: NEGATIVE
Candida glabrata, NAA: NEGATIVE
Chlamydia trachomatis, NAA: NEGATIVE
Neisseria gonorrhoeae, NAA: NEGATIVE
Trich vag by NAA: POSITIVE — AB

## 2018-08-07 MED ORDER — METRONIDAZOLE 500 MG PO TABS: 1000.0000 mg | ORAL_TABLET | Freq: Two times a day (BID) | ORAL | 0 refills | Status: AC

## 2018-08-07 NOTE — Telephone Encounter (Signed)
Patient has been notified and will pick up medication for treatment

## 2018-08-07 NOTE — Telephone Encounter (Signed)
Please inform the patient that she has trichomoniasis. This is a sexually transmitted infection that is transferred through sexual intercourse from one partner to another. You will need to take all of this medication. Your partner(s) will also need to be treated. No sexual intercourse for the next 2 weeks. Do not drink alcohol for at least 72 hours after taking medications.

## 2018-08-08 ENCOUNTER — Other Ambulatory Visit: Payer: Self-pay | Admitting: Family Medicine

## 2018-08-09 ENCOUNTER — Other Ambulatory Visit: Payer: Self-pay

## 2018-08-09 DIAGNOSIS — R319 Hematuria, unspecified: Principal | ICD-10-CM

## 2018-08-09 DIAGNOSIS — N390 Urinary tract infection, site not specified: Secondary | ICD-10-CM

## 2018-08-09 MED ORDER — SULFAMETHOXAZOLE-TRIMETHOPRIM 800-160 MG PO TABS: 1.0000 | ORAL_TABLET | Freq: Two times a day (BID) | ORAL | 0 refills | Status: DC

## 2018-08-09 MED FILL — SULFAMETHOXAZOLE-TMP DS TAB: 800-160 | 7 days supply | Qty: 14 | Fill #0

## 2018-08-09 NOTE — Telephone Encounter (Signed)
Pharmacy didn't receive medication

## 2018-09-07 ENCOUNTER — Ambulatory Visit: Payer: Medicaid Other | Admitting: Family Medicine

## 2018-09-14 ENCOUNTER — Ambulatory Visit: Payer: Medicaid Other | Admitting: Family Medicine

## 2018-09-19 ENCOUNTER — Encounter: Payer: Self-pay | Admitting: Family Medicine

## 2018-09-19 ENCOUNTER — Ambulatory Visit (INDEPENDENT_AMBULATORY_CARE_PROVIDER_SITE_OTHER): Payer: Self-pay | Admitting: Family Medicine

## 2018-09-19 VITALS — BP 130/80 | HR 90 | Temp 98.0°F | Ht 69.0 in | Wt 232.0 lb

## 2018-09-19 DIAGNOSIS — Z09 Encounter for follow-up examination after completed treatment for conditions other than malignant neoplasm: Secondary | ICD-10-CM

## 2018-09-19 DIAGNOSIS — R8290 Unspecified abnormal findings in urine: Secondary | ICD-10-CM

## 2018-09-19 DIAGNOSIS — F4321 Adjustment disorder with depressed mood: Secondary | ICD-10-CM

## 2018-09-19 DIAGNOSIS — G44209 Tension-type headache, unspecified, not intractable: Secondary | ICD-10-CM

## 2018-09-19 DIAGNOSIS — F411 Generalized anxiety disorder: Secondary | ICD-10-CM

## 2018-09-19 DIAGNOSIS — K219 Gastro-esophageal reflux disease without esophagitis: Secondary | ICD-10-CM

## 2018-09-19 LAB — POCT URINALYSIS DIP (MANUAL ENTRY)
Blood, UA: NEGATIVE
Glucose, UA: NEGATIVE mg/dL
Nitrite, UA: NEGATIVE
Protein Ur, POC: NEGATIVE mg/dL
Spec Grav, UA: >=1.03 — AB (ref 1.010–1.025)
Urobilinogen, UA: 1 E.U./dL
pH, UA: 6 (ref 5.0–8.0)

## 2018-09-19 MED ORDER — BUTALBITAL-APAP-CAFFEINE 50-325-40 MG PO TABS: 1.0000 | ORAL_TABLET | Freq: Four times a day (QID) | ORAL | 3 refills | Status: DC | PRN

## 2018-09-19 MED ORDER — OMEPRAZOLE MAGNESIUM 20 MG PO TBEC: 20.0000 mg | DELAYED_RELEASE_TABLET | Freq: Every day | ORAL | 3 refills | Status: DC

## 2018-09-19 MED ORDER — BUSPIRONE HCL 15 MG PO TABS: 15.0000 mg | ORAL_TABLET | Freq: Two times a day (BID) | ORAL | 3 refills | Status: DC

## 2018-09-19 NOTE — Patient Instructions (Signed)
Buspirone tablets What is this medicine? BUSPIRONE (byoo SPYE rone) is used to treat anxiety disorders. This medicine may be used for other purposes; ask your health care provider or pharmacist if you have questions. COMMON BRAND NAME(S): BuSpar What should I tell my health care provider before I take this medicine? They need to know if you have any of these conditions: -kidney or liver disease -an unusual or allergic reaction to buspirone, other medicines, foods, dyes, or preservatives -pregnant or trying to get pregnant -breast-feeding How should I use this medicine? Take this medicine by mouth with a glass of water. Follow the directions on the prescription label. You may take this medicine with or without food. To ensure that this medicine always works the same way for you, you should take it either always with or always without food. Take your doses at regular intervals. Do not take your medicine more often than directed. Do not stop taking except on the advice of your doctor or health care professional. Talk to your pediatrician regarding the use of this medicine in children. Special care may be needed. Overdosage: If you think you have taken too much of this medicine contact a poison control center or emergency room at once. NOTE: This medicine is only for you. Do not share this medicine with others. What if I miss a dose? If you miss a dose, take it as soon as you can. If it is almost time for your next dose, take only that dose. Do not take double or extra doses. What may interact with this medicine? Do not take this medicine with any of the following medications: -linezolid -MAOIs like Carbex, Eldepryl, Marplan, Nardil, and Parnate -methylene blue -procarbazine This medicine may also interact with the following medications: -diazepam -digoxin -diltiazem -erythromycin -grapefruit juice -haloperidol -medicines for mental depression or mood problems -medicines for seizures like  carbamazepine, phenobarbital and phenytoin -nefazodone -other medications for anxiety -rifampin -ritonavir -some antifungal medicines like itraconazole, ketoconazole, and voriconazole -verapamil -warfarin This list may not describe all possible interactions. Give your health care provider a list of all the medicines, herbs, non-prescription drugs, or dietary supplements you use. Also tell them if you smoke, drink alcohol, or use illegal drugs. Some items may interact with your medicine. What should I watch for while using this medicine? Visit your doctor or health care professional for regular checks on your progress. It may take 1 to 2 weeks before your anxiety gets better. You may get drowsy or dizzy. Do not drive, use machinery, or do anything that needs mental alertness until you know how this drug affects you. Do not stand or sit up quickly, especially if you are an older patient. This reduces the risk of dizzy or fainting spells. Alcohol can make you more drowsy and dizzy. Avoid alcoholic drinks. What side effects may I notice from receiving this medicine? Side effects that you should report to your doctor or health care professional as soon as possible: -blurred vision or other vision changes -chest pain -confusion -difficulty breathing -feelings of hostility or anger -muscle aches and pains -numbness or tingling in hands or feet -ringing in the ears -skin rash and itching -vomiting -weakness Side effects that usually do not require medical attention (report to your doctor or health care professional if they continue or are bothersome): -disturbed dreams, nightmares -headache -nausea -restlessness or nervousness -sore throat and nasal congestion -stomach upset This list may not describe all possible side effects. Call your doctor for medical advice about side   effects. You may report side effects to FDA at 1-800-FDA-1088. Where should I keep my medicine? Keep out of the reach  of children. Store at room temperature below 30 degrees C (86 degrees F). Protect from light. Keep container tightly closed. Throw away any unused medicine after the expiration date. NOTE: This sheet is a summary. It may not cover all possible information. If you have questions about this medicine, talk to your doctor, pharmacist, or health care provider.  2019 Elsevier/Gold Standard (2010-04-01 18:06:11) Exercising to Lose Weight Exercise is structured, repetitive physical activity to improve fitness and health. Getting regular exercise is important for everyone. It is especially important if you are overweight. Being overweight increases your risk of heart disease, stroke, diabetes, high blood pressure, and several types of cancer. Reducing your calorie intake and exercising can help you lose weight. Exercise is usually categorized as moderate or vigorous intensity. To lose weight, most people need to do a certain amount of moderate-intensity or vigorous-intensity exercise each week. Moderate-intensity exercise  Moderate-intensity exercise is any activity that gets you moving enough to burn at least three times more energy (calories) than if you were sitting. Examples of moderate exercise include:  Walking a mile in 15 minutes.  Doing light yard work.  Biking at an easy pace. Most people should get at least 150 minutes (2 hours and 30 minutes) a week of moderate-intensity exercise to maintain their body weight. Vigorous-intensity exercise Vigorous-intensity exercise is any activity that gets you moving enough to burn at least six times more calories than if you were sitting. When you exercise at this intensity, you should be working hard enough that you are not able to carry on a conversation. Examples of vigorous exercise include:  Running.  Playing a team sport, such as football, basketball, and soccer.  Jumping rope. Most people should get at least 75 minutes (1 hour and 15 minutes)  a week of vigorous-intensity exercise to maintain their body weight. How can exercise affect me? When you exercise enough to burn more calories than you eat, you lose weight. Exercise also reduces body fat and builds muscle. The more muscle you have, the more calories you burn. Exercise also:  Improves mood.  Reduces stress and tension.  Improves your overall fitness, flexibility, and endurance.  Increases bone strength. The amount of exercise you need to lose weight depends on:  Your age.  The type of exercise.  Any health conditions you have.  Your overall physical ability. Talk to your health care provider about how much exercise you need and what types of activities are safe for you. What actions can I take to lose weight? Nutrition   Make changes to your diet as told by your health care provider or diet and nutrition specialist (dietitian). This may include: ? Eating fewer calories. ? Eating more protein. ? Eating less unhealthy fats. ? Eating a diet that includes fresh fruits and vegetables, whole grains, low-fat dairy products, and lean protein. ? Avoiding foods with added fat, salt, and sugar.  Drink plenty of water while you exercise to prevent dehydration or heat stroke. Activity  Choose an activity that you enjoy and set realistic goals. Your health care provider can help you make an exercise plan that works for you.  Exercise at a moderate or vigorous intensity most days of the week. ? The intensity of exercise may vary from person to person. You can tell how intense a workout is for you by paying attention to your breathing  and heartbeat. Most people will notice their breathing and heartbeat get faster with more intense exercise.  Do resistance training twice each week, such as: ? Push-ups. ? Sit-ups. ? Lifting weights. ? Using resistance bands.  Getting short amounts of exercise can be just as helpful as long structured periods of exercise. If you have  trouble finding time to exercise, try to include exercise in your daily routine. ? Get up, stretch, and walk around every 30 minutes throughout the day. ? Go for a walk during your lunch break. ? Park your car farther away from your destination. ? If you take public transportation, get off one stop early and walk the rest of the way. ? Make phone calls while standing up and walking around. ? Take the stairs instead of elevators or escalators.  Wear comfortable clothes and shoes with good support.  Do not exercise so much that you hurt yourself, feel dizzy, or get very short of breath. Where to find more information  [place] Department of Health and Human Services: ThisPath.fi  Centers for Disease Control and Prevention (CDC): FootballExhibition.com.br Contact a health care provider:  Before starting a new exercise program.  If you have questions or concerns about your weight.  If you have a medical problem that keeps you from exercising. Get help right away if you have any of the following while exercising:  Injury.  Dizziness.  Difficulty breathing or shortness of breath that does not go away when you stop exercising.  Chest pain.  Rapid heartbeat. Summary  Being overweight increases your risk of heart disease, stroke, diabetes, high blood pressure, and several types of cancer.  Losing weight happens when you burn more calories than you eat.  Reducing the amount of calories you eat in addition to getting regular moderate or vigorous exercise each week helps you lose weight. This information is not intended to replace advice given to you by your health care provider. Make sure you discuss any questions you have with your health care provider. Document Released: 09/24/2010 Document Revised: 09/04/2017 Document Reviewed: 09/04/2017 Elsevier Interactive Patient Education  2019 Else[employer] Inc. Heart-Healthy Eating Plan Heart-healthy meal planning includes:  Eating less unhealthy  fats.  Eating more healthy fats.  Making other changes in your diet. Talk with your doctor or a diet specialist (dietitian) to create an eating plan that is right for you. What is my plan? Your doctor may recommend an eating plan that includes:  Total fat: ______% or less of total calories a day.  Saturated fat: ______% or less of total calories a day.  Cholesterol: less than _________mg a day. What are tips for following this plan? Cooking Avoid frying your food. Try to bake, boil, grill, or broil it instead. You can also reduce fat by:  Removing the skin from poultry.  Removing all visible fats from meats.  Steaming vegetables in water or broth. Meal planning   At meals, divide your plate into four equal parts: ? Fill one-half of your plate with vegetables and green salads. ? Fill one-fourth of your plate with whole grains. ? Fill one-fourth of your plate with lean protein foods.  Eat 4-5 servings of vegetables per day. A serving of vegetables is: ? 1 cup of raw or cooked vegetables. ? 2 cups of raw leafy greens.  Eat 4-5 servings of fruit per day. A serving of fruit is: ? 1 medium whole fruit. ?  cup of dried fruit. ?  cup of fresh, frozen, or canned fruit. ?  cup of 100% fruit juice.  Eat more foods that have soluble fiber. These are apples, broccoli, carrots, beans, peas, and barley. Try to get 20-30 g of fiber per day.  Eat 4-5 servings of nuts, legumes, and seeds per week: ? 1 serving of dried beans or legumes equals  cup after being cooked. ? 1 serving of nuts is  cup. ? 1 serving of seeds equals 1 tablespoon. General information  Eat more home-cooked food. Eat less restaurant, buffet, and fast food.  Limit or avoid alcohol.  Limit foods that are high in starch and sugar.  Avoid fried foods.  Lose weight if you are overweight.  Keep track of how much salt (sodium) you eat. This is important if you have high blood pressure. Ask your doctor to  tell you more about this.  Try to add vegetarian meals each week. Fats  Choose healthy fats. These include olive oil and canola oil, flaxseeds, walnuts, almonds, and seeds.  Eat more omega-3 fats. These include salmon, mackerel, sardines, tuna, flaxseed oil, and ground flaxseeds. Try to eat fish at least 2 times each week.  Check food labels. Avoid foods with trans fats or high amounts of saturated fat.  Limit saturated fats. ? These are often found in animal products, such as meats, butter, and cream. ? These are also found in plant foods, such as palm oil, palm kernel oil, and coconut oil.  Avoid foods with partially hydrogenated oils in them. These have trans fats. Examples are stick margarine, some tub margarines, cookies, crackers, and other baked goods. What foods can I eat? Fruits All fresh, canned (in natural juice), or frozen fruits. Vegetables Fresh or frozen vegetables (raw, steamed, roasted, or grilled). Green salads. Grains Most grains. Choose whole wheat and whole grains most of the time. Rice and pasta, including brown rice and pastas made with whole wheat. Meats and other proteins Lean, well-trimmed beef, veal, pork, and lamb. Chicken and [place] without skin. All fish and shellfish. Wild duck, rabbit, pheasant, and venison. Egg whites or low-cholesterol egg substitutes. Dried beans, peas, lentils, and tofu. Seeds and most nuts. Dairy Low-fat or nonfat cheeses, including ricotta and mozzarella. Skim or 1% milk that is liquid, powdered, or evaporated. Buttermilk that is made with low-fat milk. Nonfat or low-fat yogurt. Fats and oils Non-hydrogenated (trans-free) margarines. Vegetable oils, including soybean, sesame, sunflower, olive, peanut, safflower, corn, canola, and cottonseed. Salad dressings or mayonnaise made with a vegetable oil. Beverages Mineral water. Coffee and tea. Diet carbonated beverages. Sweets and desserts Sherbet, gelatin, and fruit ice. Small amounts  of dark chocolate. Limit all sweets and desserts. Seasonings and condiments All seasonings and condiments. The items listed above may not be a complete list of foods and drinks you can eat. Contact a dietitian for more options. What foods should I avoid? Fruits Canned fruit in heavy syrup. Fruit in cream or butter sauce. Fried fruit. Limit coconut. Vegetables Vegetables cooked in cheese, cream, or butter sauce. Fried vegetables. Grains Breads that are made with saturated or trans fats, oils, or whole milk. Croissants. Sweet rolls. Donuts. High-fat crackers, such as cheese crackers. Meats and other proteins Fatty meats, such as hot dogs, ribs, sausage, bacon, rib-eye roast or steak. High-fat deli meats, such as salami and bologna. Caviar. Domestic duck and goose. Organ meats, such as liver. Dairy Cream, sour cream, cream cheese, and creamed cottage cheese. Whole-milk cheeses. Whole or 2% milk that is liquid, evaporated, or condensed. Whole buttermilk. Cream sauce or high-fat cheese sauce.  Yogurt that is made from whole milk. Fats and oils Meat fat, or shortening. Cocoa butter, hydrogenated oils, palm oil, coconut oil, palm kernel oil. Solid fats and shortenings, including bacon fat, salt pork, lard, and butter. Nondairy cream substitutes. Salad dressings with cheese or sour cream. Beverages Regular sodas and juice drinks with added sugar. Sweets and desserts Frosting. Pudding. Cookies. Cakes. Pies. Milk chocolate or white chocolate. Buttered syrups. Full-fat ice cream or ice cream drinks. The items listed above may not be a complete list of foods and drinks to avoid. Contact a dietitian for more information. Summary  Heart-healthy meal planning includes eating less unhealthy fats, eating more healthy fats, and making other changes in your diet.  Eat a balanced diet. This includes fruits and vegetables, low-fat or nonfat dairy, lean protein, nuts and legumes, whole grains, and heart-healthy  oils and fats. This information is not intended to replace advice given to you by your health care provider. Make sure you discuss any questions you have with your health care provider. Document Released: 02/21/2012 Document Revised: 09/29/2017 Document Reviewed: 09/29/2017 Elsevier Interactive Patient Education  2019 Else[employer] Inc. DASH Eating Plan DASH stands for "Dietary Approaches to Stop Hypertension." The DASH eating plan is a healthy eating plan that has been shown to reduce high blood pressure (hypertension). It may also reduce your risk for type 2 diabetes, heart disease, and stroke. The DASH eating plan may also help with weight loss. What are tips for following this plan?  General guidelines  Avoid eating more than 2,300 mg (milligrams) of salt (sodium) a day. If you have hypertension, you may need to reduce your sodium intake to 1,500 mg a day.  Limit alcohol intake to no more than 1 drink a day for nonpregnant women and 2 drinks a day for men. One drink equals 12 oz of beer, 5 oz of wine, or 1 oz of hard liquor.  Work with your health care provider to maintain a healthy body weight or to lose weight. Ask what an ideal weight is for you.  Get at least 30 minutes of exercise that causes your heart to beat faster (aerobic exercise) most days of the week. Activities may include walking, swimming, or biking.  Work with your health care provider or diet and nutrition specialist (dietitian) to adjust your eating plan to your individual calorie needs. Reading food labels   Check food labels for the amount of sodium per serving. Choose foods with less than 5 percent of the Daily Value of sodium. Generally, foods with less than 300 mg of sodium per serving fit into this eating plan.  To find whole grains, look for the word "whole" as the first word in the ingredient list. Shopping  Buy products labeled as "low-sodium" or "no salt added."  Buy fresh foods. Avoid canned foods and  premade or frozen meals. Cooking  Avoid adding salt when cooking. Use salt-free seasonings or herbs instead of table salt or sea salt. Check with your health care provider or pharmacist before using salt substitutes.  Do not fry foods. Cook foods using healthy methods such as baking, boiling, grilling, and broiling instead.  Cook with heart-healthy oils, such as olive, canola, soybean, or sunflower oil. Meal planning  Eat a balanced diet that includes: ? 5 or more servings of fruits and vegetables each day. At each meal, try to fill half of your plate with fruits and vegetables. ? Up to 6-8 servings of whole grains each day. ? Less than 6  oz of lean meat, poultry, or fish each day. A 3-oz serving of meat is about the same size as a deck of cards. One egg equals 1 oz. ? 2 servings of low-fat dairy each day. ? A serving of nuts, seeds, or beans 5 times each week. ? Heart-healthy fats. Healthy fats called Omega-3 fatty acids are found in foods such as flaxseeds and coldwater fish, like sardines, salmon, and mackerel.  Limit how much you eat of the following: ? Canned or prepackaged foods. ? Food that is high in trans fat, such as fried foods. ? Food that is high in saturated fat, such as fatty meat. ? Sweets, desserts, sugary drinks, and other foods with added sugar. ? Full-fat dairy products.  Do not salt foods before eating.  Try to eat at least 2 vegetarian meals each week.  Eat more home-cooked food and less restaurant, buffet, and fast food.  When eating at a restaurant, ask that your food be prepared with less salt or no salt, if possible. What foods are recommended? The items listed may not be a complete list. Talk with your dietitian about what dietary choices are best for you. Grains Whole-grain or whole-wheat bread. Whole-grain or whole-wheat pasta. Brown rice. Orpah Cobb. Bulgur. Whole-grain and low-sodium cereals. Pita bread. Low-fat, low-sodium crackers. Whole-wheat  flour tortillas. Vegetables Fresh or frozen vegetables (raw, steamed, roasted, or grilled). Low-sodium or reduced-sodium tomato and vegetable juice. Low-sodium or reduced-sodium tomato sauce and tomato paste. Low-sodium or reduced-sodium canned vegetables. Fruits All fresh, dried, or frozen fruit. Canned fruit in natural juice (without added sugar). Meat and other protein foods Skinless chicken or [place]. Ground chicken or [place]. Pork with fat trimmed off. Fish and seafood. Egg whites. Dried beans, peas, or lentils. Unsalted nuts, nut butters, and seeds. Unsalted canned beans. Lean cuts of beef with fat trimmed off. Low-sodium, lean deli meat. Dairy Low-fat (1%) or fat-free (skim) milk. Fat-free, low-fat, or reduced-fat cheeses. Nonfat, low-sodium ricotta or cottage cheese. Low-fat or nonfat yogurt. Low-fat, low-sodium cheese. Fats and oils Soft margarine without trans fats. Vegetable oil. Low-fat, reduced-fat, or light mayonnaise and salad dressings (reduced-sodium). Canola, safflower, olive, soybean, and sunflower oils. Avocado. Seasoning and other foods Herbs. Spices. Seasoning mixes without salt. Unsalted popcorn and pretzels. Fat-free sweets. What foods are not recommended? The items listed may not be a complete list. Talk with your dietitian about what dietary choices are best for you. Grains Baked goods made with fat, such as croissants, muffins, or some breads. Dry pasta or rice meal packs. Vegetables Creamed or fried vegetables. Vegetables in a cheese sauce. Regular canned vegetables (not low-sodium or reduced-sodium). Regular canned tomato sauce and paste (not low-sodium or reduced-sodium). Regular tomato and vegetable juice (not low-sodium or reduced-sodium). Rosita Fire. Olives. Fruits Canned fruit in a light or heavy syrup. Fried fruit. Fruit in cream or butter sauce. Meat and other protein foods Fatty cuts of meat. Ribs. Fried meat. Tomasa Blase. Sausage. Bologna and other processed lunch  meats. Salami. Fatback. Hotdogs. Bratwurst. Salted nuts and seeds. Canned beans with added salt. Canned or smoked fish. Whole eggs or egg yolks. Chicken or [place] with skin. Dairy Whole or 2% milk, cream, and half-and-half. Whole or full-fat cream cheese. Whole-fat or sweetened yogurt. Full-fat cheese. Nondairy creamers. Whipped toppings. Processed cheese and cheese spreads. Fats and oils Butter. Stick margarine. Lard. Shortening. Ghee. Bacon fat. Tropical oils, such as coconut, palm kernel, or palm oil. Seasoning and other foods Salted popcorn and pretzels. Onion salt, garlic salt, seasoned  salt, table salt, and sea salt. Worcestershire sauce. Tartar sauce. Barbecue sauce. Teriyaki sauce. Soy sauce, including reduced-sodium. Steak sauce. Canned and packaged gravies. Fish sauce. Oyster sauce. Cocktail sauce. Horseradish that you find on the shelf. Ketchup. Mustard. Meat flavorings and tenderizers. Bouillon cubes. Hot sauce and Tabasco sauce. Premade or packaged marinades. Premade or packaged taco seasonings. Relishes. Regular salad dressings. Where to find more information:  National Heart, Lung, and Blood Institute: PopSteam.is  American Heart Association: www.heart.org Summary  The DASH eating plan is a healthy eating plan that has been shown to reduce high blood pressure (hypertension). It may also reduce your risk for type 2 diabetes, heart disease, and stroke.  With the DASH eating plan, you should limit salt (sodium) intake to 2,300 mg a day. If you have hypertension, you may need to reduce your sodium intake to 1,500 mg a day.  When on the DASH eating plan, aim to eat more fresh fruits and vegetables, whole grains, lean proteins, low-fat dairy, and heart-healthy fats.  Work with your health care provider or diet and nutrition specialist (dietitian) to adjust your eating plan to your individual calorie needs. This information is not intended to replace advice given to you by your  health care provider. Make sure you discuss any questions you have with your health care provider. Document Released: 08/11/2011 Document Revised: 08/15/2016 Document Reviewed: 08/15/2016 Elsevier Interactive Patient Education  2019 [employer].

## 2018-09-19 NOTE — Progress Notes (Signed)
Follow up  Subjective:    Patient ID: Judith Terry, female    DOB: 08/19/1996, 23 y.o.   MRN: [id number]   Chief Complaint  Patient presents with  . Follow-up    Chronic condition     HPI  Judith Terry is a 23 year old with a past medical history of Headache, GERD, Chronic Back Pain, and Anxiety. She is here today for follow up.   Current Status: Since her last office visit, she is doing well with no complaints. Her anxiety is moderate today with family issues. She denies suicidal ideations, homicidal ideations, or auditory hallucinations.   She denies fevers, chills, fatigue, recent infections, weight loss, and night sweats. She has not had any headaches, visual changes, dizziness, and falls. No chest pain, heart palpitations, cough and shortness of breath reported. No reports of GI problems such as nausea, vomiting, diarrhea, and constipation. She has no reports of blood in stools, dysuria and hematuria. She has mild pain today.   Review of Systems  Constitutional: Negative.   HENT: Negative.   Eyes: Negative.   Cardiovascular: Negative.   Gastrointestinal: Positive for abdominal distention.  Endocrine: Negative.   Genitourinary: Negative.   Musculoskeletal: Positive for back pain (chronic---occasionally).  Skin: Negative.   Allergic/Immunologic: Negative.   Neurological: Positive for headaches (Migraines).  Hematological: Negative.   Psychiatric/Behavioral: Negative.    Objective:   Physical Exam Vitals signs and nursing note reviewed.  Constitutional:      Appearance: Normal appearance.  HENT:     Head: Normocephalic and atraumatic.     Right Ear: External ear normal.     Left Ear: External ear normal.     Nose: Nose normal.     Mouth/Throat:     Mouth: Mucous membranes are moist.     Pharynx: Oropharynx is clear.  Eyes:     Conjunctiva/sclera: Conjunctivae normal.  Neck:     Musculoskeletal: Normal range of motion and neck supple.  Cardiovascular:     Rate and  Rhythm: Normal rate and regular rhythm.     Pulses: Normal pulses.     Heart sounds: Normal heart sounds.  Pulmonary:     Effort: Pulmonary effort is normal.     Breath sounds: Normal breath sounds.  Abdominal:     General: Bowel sounds are normal. There is distension (Obese).     Palpations: Abdomen is soft.  Musculoskeletal: Normal range of motion.  Skin:    General: Skin is warm and dry.     Capillary Refill: Capillary refill takes less than 2 seconds.  Neurological:     General: No focal deficit present.     Mental Status: She is alert and oriented to person, place, and time.  Psychiatric:        Mood and Affect: Mood normal.        Behavior: Behavior normal.        Thought Content: Thought content normal.        Judgment: Judgment normal.    Assessment & Plan:   1. Generalized anxiety disorder Moderate. We will increase Buspar to 15 mg BID today.  - busPIRone (BUSPAR) 15 MG tablet; Take 1 tablet (15 mg total) by mouth 2 (two) times daily.  Dispense: 60 tablet; Refill: 3  2. Situational depression  3. Tension headache Stable. - butalbital-acetaminophen-caffeine (FIORICET, ESGIC) 50-325-40 MG tablet; Take 1-2 tablets by mouth every 6 (six) hours as needed for headache.  Dispense: 30 tablet; Refill: 3  4. Gastroesophageal reflux disease  without esophagitis - omeprazole (PRILOSEC OTC) 20 MG tablet; Take 1 tablet (20 mg total) by mouth daily.  Dispense: 30 tablet; Refill: 3  5. Abnormal urinalysis Results are pending.  - Urine Culture  6. Follow up She will follow up in 3 months.  - POCT urinalysis dipstick  Meds ordered this encounter  Medications  . butalbital-acetaminophen-caffeine (FIORICET, ESGIC) 50-325-40 MG tablet    Sig: Take 1-2 tablets by mouth every 6 (six) hours as needed for headache.    Dispense:  30 tablet    Refill:  3  . omeprazole (PRILOSEC OTC) 20 MG tablet    Sig: Take 1 tablet (20 mg total) by mouth daily.    Dispense:  30 tablet    Refill:   3  . busPIRone (BUSPAR) 15 MG tablet    Sig: Take 1 tablet (15 mg total) by mouth 2 (two) times daily.    Dispense:  60 tablet    Refill:  3    Raliegh IpNatalie Hser Belanger,  MSN, [facility] JacksonFNP-C [facility] [address]  [place], [place] [id number] [phone]

## 2018-09-21 LAB — URINE CULTURE

## 2018-11-13 ENCOUNTER — Other Ambulatory Visit: Payer: Self-pay | Admitting: Family Medicine

## 2018-11-13 ENCOUNTER — Encounter: Payer: Self-pay | Admitting: Family Medicine

## 2018-11-13 DIAGNOSIS — G44209 Tension-type headache, unspecified, not intractable: Secondary | ICD-10-CM

## 2018-11-13 DIAGNOSIS — F411 Generalized anxiety disorder: Secondary | ICD-10-CM

## 2018-11-13 DIAGNOSIS — K219 Gastro-esophageal reflux disease without esophagitis: Secondary | ICD-10-CM

## 2018-11-13 MED ORDER — OMEPRAZOLE MAGNESIUM 20 MG PO TBEC: 20.0000 mg | DELAYED_RELEASE_TABLET | Freq: Every day | ORAL | 3 refills | Status: DC

## 2018-11-13 MED ORDER — BUTALBITAL-APAP-CAFFEINE 50-325-40 MG PO TABS: 1.0000 | ORAL_TABLET | Freq: Four times a day (QID) | ORAL | 1 refills | Status: DC | PRN

## 2018-11-13 MED ORDER — BUTALBITAL-APAP-CAFFEINE 50-325-40 MG PO TABS: 1.0000 | ORAL_TABLET | Freq: Four times a day (QID) | ORAL | 3 refills | Status: DC | PRN

## 2018-11-13 MED ORDER — BUSPIRONE HCL 15 MG PO TABS: 15.0000 mg | ORAL_TABLET | Freq: Two times a day (BID) | ORAL | 3 refills | Status: DC

## 2018-11-23 ENCOUNTER — Encounter: Payer: Self-pay | Admitting: Family Medicine

## 2018-11-23 MED FILL — busPIRone HCL 15 MG TABS: 15 | 30 days supply | Qty: 60 | Fill #0

## 2018-11-23 MED FILL — OMEPRAZOLE 20 MG CAP: 20 | 30 days supply | Qty: 30 | Fill #0

## 2018-11-26 ENCOUNTER — Telehealth: Payer: Self-pay

## 2018-11-26 NOTE — Telephone Encounter (Signed)
Pharmacy didn't have refill for Fioricet

## 2018-11-28 ENCOUNTER — Other Ambulatory Visit: Payer: Self-pay | Admitting: Family Medicine

## 2018-11-28 DIAGNOSIS — G44209 Tension-type headache, unspecified, not intractable: Secondary | ICD-10-CM

## 2018-11-28 MED ORDER — BUTALBITAL-APAP-CAFFEINE 50-325-40 MG PO TABS: 1.0000 | ORAL_TABLET | Freq: Four times a day (QID) | ORAL | 1 refills | Status: DC | PRN

## 2018-11-28 NOTE — Telephone Encounter (Signed)
Left a vm for patient to callback 

## 2018-11-30 ENCOUNTER — Ambulatory Visit (INDEPENDENT_AMBULATORY_CARE_PROVIDER_SITE_OTHER): Payer: Self-pay | Admitting: Family Medicine

## 2018-11-30 ENCOUNTER — Other Ambulatory Visit: Payer: Self-pay

## 2018-11-30 ENCOUNTER — Encounter: Payer: Self-pay | Admitting: Family Medicine

## 2018-11-30 VITALS — BP 122/60 | HR 96 | Temp 98.2°F | Ht 69.0 in | Wt 235.0 lb

## 2018-11-30 DIAGNOSIS — Z202 Contact with and (suspected) exposure to infections with a predominantly sexual mode of transmission: Secondary | ICD-10-CM

## 2018-11-30 DIAGNOSIS — N390 Urinary tract infection, site not specified: Secondary | ICD-10-CM

## 2018-11-30 DIAGNOSIS — Z09 Encounter for follow-up examination after completed treatment for conditions other than malignant neoplasm: Secondary | ICD-10-CM

## 2018-11-30 DIAGNOSIS — R319 Hematuria, unspecified: Secondary | ICD-10-CM

## 2018-11-30 DIAGNOSIS — R8290 Unspecified abnormal findings in urine: Secondary | ICD-10-CM

## 2018-11-30 DIAGNOSIS — N898 Other specified noninflammatory disorders of vagina: Secondary | ICD-10-CM | POA: Insufficient documentation

## 2018-11-30 LAB — POCT URINALYSIS DIP (MANUAL ENTRY)
Glucose, UA: NEGATIVE mg/dL
Ketones, POC UA: NEGATIVE mg/dL
Nitrite, UA: NEGATIVE
Protein Ur, POC: 30 mg/dL — AB
Spec Grav, UA: >=1.03 — AB (ref 1.010–1.025)
Urobilinogen, UA: 2 E.U./dL — AB
pH, UA: 5.5 (ref 5.0–8.0)

## 2018-11-30 MED ORDER — SULFAMETHOXAZOLE-TRIMETHOPRIM 800-160 MG PO TABS: 1.0000 | ORAL_TABLET | Freq: Two times a day (BID) | ORAL | 0 refills | Status: AC

## 2018-11-30 NOTE — Progress Notes (Signed)
[facility]   Sick Visit  Subjective:  Patient ID: Judith Terry, female    DOB: 04-30-1996  Age: 23 y.o. MRN: [id number]  CC:  Chief Complaint  Patient presents with  . Vaginal Discharge  . Vaginal Itching  . Exposure to STD    HPI Judith Terry is a 23 year old female who presents for Sick Visit today.   Past Medical History:  Diagnosis Date  . Anxiety   . Chronic back pain   . GERD (gastroesophageal reflux disease)   . Headache    Current Status: Since her last office visit, she states that she has been having itching, odor, burning, and discharge X 3 days now. She denies urinary frequency, dysuria, hematuria, and suprapubic pain/discomfort. She has not taken any medications for relief.   She denies fevers, chills, fatigue, recent infections, weight loss, and night sweats. She has not had any headaches, visual changes, dizziness, and falls. No chest pain, heart palpitations, cough and shortness of breath reported. No reports of GI problems such as nausea, vomiting, diarrhea, and constipation. She has no reports of blood in stools, dysuria and hematuria. No depression or anxiety reported. She denies pain today.    Past Surgical History:  Procedure Laterality Date  . NO PAST SURGERIES      Family History  Problem Relation Age of Onset  . Diabetes Mother   . Diabetes Father   . Heart disease Father     Social History   Socioeconomic History  . Marital status: Single    Spouse name: Not on file  . Number of children: Not on file  . Years of education: Not on file  . Highest education level: Not on file  Occupational History  . Not on file  Social Needs  . Financial resource strain: Not on file  . Food insecurity:    Worry: Not on file    Inability: Not on file  . Transportation needs:    Medical: Not on file    Non-medical: Not on file  Tobacco Use  . Smoking status: Never Smoker  . Smokeless  tobacco: Never Used  Substance and Sexual Activity  . Alcohol use: No  . Drug use: No  . Sexual activity: Yes    Birth control/protection: None  Lifestyle  . Physical activity:    Days per week: Not on file    Minutes per session: Not on file  . Stress: Not on file  Relationships  . Social connections:    Talks on phone: Not on file    Gets together: Not on file    Attends religious service: Not on file    Active member of club or organization: Not on file    Attends meetings of clubs or organizations: Not on file    Relationship status: Not on file  . Intimate partner violence:    Fear of current or ex partner: Not on file    Emotionally abused: Not on file    Physically abused: Not on file    Forced sexual activity: Not on file  Other Topics Concern  . Not on file  Social History Narrative  . Not on file    Outpatient Medications Prior to Visit  Medication Sig Dispense Refill  . busPIRone (BUSPAR) 15 MG tablet Take 1 tablet (15 mg total) by mouth 2 (two) times daily. 60 tablet 3  . butalbital-acetaminophen-caffeine (FIORICET, ESGIC) 50-325-40 MG tablet Take 1-2 tablets  by mouth every 6 (six) hours as needed for headache. 30 tablet 1  . cyclobenzaprine (FLEXERIL) 10 MG tablet Take 1 tablet (10 mg total) by mouth 3 (three) times daily as needed for muscle spasms. 30 tablet 0  . omeprazole (PRILOSEC OTC) 20 MG tablet Take 1 tablet (20 mg total) by mouth daily. 30 tablet 3  . diphenhydrAMINE (BENADRYL) 25 mg capsule Take 25 mg by mouth every 6 (six) hours as needed for itching.     No facility-administered medications prior to visit.     No Known Allergies  ROS Review of Systems  Constitutional: Negative.   HENT: Negative.   Eyes: Negative.   Respiratory: Negative.   Cardiovascular: Negative.   Gastrointestinal: Negative.   Endocrine: Negative.   Genitourinary: Positive for vaginal discharge.       Burning, odor, and itching.   Musculoskeletal: Negative.   Skin:  Negative.   Allergic/Immunologic: Negative.   Neurological: Positive for headaches.  Hematological: Negative.   Psychiatric/Behavioral: Negative.    Objective:    Physical Exam  Constitutional: She is oriented to person, place, and time. She appears well-developed and well-nourished.  HENT:  Head: Normocephalic and atraumatic.  Eyes: Conjunctivae are normal.  Neck: Normal range of motion. Neck supple.  Cardiovascular: Normal rate, regular rhythm, normal heart sounds and intact distal pulses.  Pulmonary/Chest: Effort normal and breath sounds normal.  Abdominal: Soft. Bowel sounds are normal.  Musculoskeletal: Normal range of motion.  Neurological: She is alert and oriented to person, place, and time. She has normal reflexes.  Skin: Skin is warm and dry.  Psychiatric: She has a normal mood and affect. Her behavior is normal. Judgment and thought content normal.  Nursing note and vitals reviewed.   BP 122/60 (BP Location: Left Arm, Patient Position: Sitting, Cuff Size: Large)   Pulse 96   Temp 98.2 F (36.8 C) (Oral)   Ht 5\' 9"  (1.753 m)   Wt 235 lb (106.6 kg)   LMP 11/06/2018   SpO2 100%   BMI 34.70 kg/m  Wt Readings from Last 3 Encounters:  11/30/18 235 lb (106.6 kg)  09/19/18 232 lb (105.2 kg)  07/31/18 234 lb (106.1 kg)     Health Maintenance Due  Topic Date Due  . CHLAMYDIA SCREENING  05/18/2016    There are no preventive care reminders to display for this patient.  Lab Results  Component Value Date   TSH 2.150 12/27/2017   Lab Results  Component Value Date   WBC 7.3 01/13/2017   HGB 13.2 01/13/2017   HCT 41.0 01/13/2017   MCV 95.3 01/13/2017   PLT 237 01/13/2017   Lab Results  Component Value Date   NA 139 12/27/2017   K 4.6 12/27/2017   CO2 23 12/27/2017   GLUCOSE 97 12/27/2017   BUN 17 12/27/2017   CREATININE 0.59 12/27/2017   BILITOT 0.2 12/27/2017   ALKPHOS 81 12/27/2017   AST 11 12/27/2017   ALT 11 12/27/2017   PROT 7.0 12/27/2017    ALBUMIN 4.5 12/27/2017   CALCIUM 9.0 12/27/2017   ANIONGAP 8 07/24/2016   No results found for: CHOL No results found for: HDL No results found for: LDLCALC No results found for: TRIG No results found for: [facility] District[facility]OLHDL Lab Results  Component Value Date   HGBA1C 5.0 12/27/2017   Assessment & Plan:   1. Vaginal discharge - POCT urinalysis dipstick - NuSwab Vaginitis Plus (VG+)  2. Possible exposure to STD Results are pending.  - NuSwab Vaginitis  Plus (VG+) - STD Screen (8)  3. Abnormal urinalysis Results are pending - Urine Culture  4. Urinary tract infection with hematuria, site unspecified We will initiate Septra today. - sulfamethoxazole-trimethoprim (BACTRIM DS,SEPTRA DS) 800-160 MG tablet; Take 1 tablet by mouth 2 (two) times daily for 7 days.  Dispense: 14 tablet; Refill: 0  5. Follow up She will follow up in 3 months.   Meds ordered this encounter  Medications  . sulfamethoxazole-trimethoprim (BACTRIM DS,SEPTRA DS) 800-160 MG tablet    Sig: Take 1 tablet by mouth 2 (two) times daily for 7 days.    Dispense:  14 tablet    Refill:  0    Orders Placed This Encounter  Procedures  . Urine Culture  . NuSwab Vaginitis Plus (VG+)  . STD Screen (8)  . POCT urinalysis dipstick    Referral Orders  No referral(s) requested today    Raliegh Ip,  MSN, FNP-C [facility] [address] [place], [place] [address] [phone]    Problem List Items Addressed This Visit    None    Visit Diagnoses    Vaginal discharge    -  Primary   Relevant Orders   POCT urinalysis dipstick (Completed)   NuSwab Vaginitis Plus (VG+)   Possible exposure to STD       Relevant Orders   NuSwab Vaginitis Plus (VG+)   STD Screen (8)   Abnormal urinalysis       Relevant Orders   Urine Culture   Urinary tract infection with hematuria, site unspecified       Relevant Medications   sulfamethoxazole-trimethoprim (BACTRIM DS,SEPTRA DS) 800-160  MG tablet   Follow up          Meds ordered this encounter  Medications  . sulfamethoxazole-trimethoprim (BACTRIM DS,SEPTRA DS) 800-160 MG tablet    Sig: Take 1 tablet by mouth 2 (two) times daily for 7 days.    Dispense:  14 tablet    Refill:  0    Follow-up: Return in about 3 months (around 03/02/2019).    Kallie Locks, FNP

## 2018-11-30 NOTE — Patient Instructions (Addendum)
Urinary Tract Infection, Adult A urinary tract infection (UTI) is an infection of any part of the urinary tract. The urinary tract includes:  The kidneys.  The ureters.  The bladder.  The urethra. These organs make, store, and get rid of pee (urine) in the body. What are the causes? This is caused by germs (bacteria) in your genital area. These germs grow and cause swelling (inflammation) of your urinary tract. What increases the risk? You are more likely to develop this condition if:  You have a small, thin tube (catheter) to drain pee.  You cannot control when you pee or poop (incontinence).  You are female, and: ? You use these methods to prevent pregnancy: ? A medicine that kills sperm (spermicide). ? A device that blocks sperm (diaphragm). ? You have low levels of a female hormone (estrogen). ? You are pregnant.  You have genes that add to your risk.  You are sexually active.  You take antibiotic medicines.  You have trouble peeing because of: ? A prostate that is bigger than normal, if you are female. ? A blockage in the part of your body that drains pee from the bladder (urethra). ? A kidney stone. ? A nerve condition that affects your bladder (neurogenic bladder). ? Not getting enough to drink. ? Not peeing often enough.  You have other conditions, such as: ? Diabetes. ? A weak disease-fighting system (immune system). ? Sickle cell disease. ? Gout. ? Injury of the spine. What are the signs or symptoms? Symptoms of this condition include:  Needing to pee right away (urgently).  Peeing often.  Peeing small amounts often.  Pain or burning when peeing.  Blood in the pee.  Pee that smells bad or not like normal.  Trouble peeing.  Pee that is cloudy.  Fluid coming from the vagina, if you are female.  Pain in the belly or lower back. Other symptoms include:  Throwing up (vomiting).  No urge to eat.  Feeling mixed up (confused).  Being tired  and grouchy (irritable).  A fever.  Watery poop (diarrhea). How is this treated? This condition may be treated with:  Antibiotic medicine.  Other medicines.  Drinking enough water. Follow these instructions at home:  Medicines  Take over-the-counter and prescription medicines only as told by your doctor.  If you were prescribed an antibiotic medicine, take it as told by your doctor. Do not stop taking it even if you start to feel better. General instructions  Make sure you: ? Pee until your bladder is empty. ? Do not hold pee for a long time. ? Empty your bladder after sex. ? Wipe from front to back after pooping if you are a female. Use each tissue one time when you wipe.  Drink enough fluid to keep your pee pale yellow.  Keep all follow-up visits as told by your doctor. This is important. Contact a doctor if:  You do not get better after 1-2 days.  Your symptoms go away and then come back. Get help right away if:  You have very bad back pain.  You have very bad pain in your lower belly.  You have a fever.  You are sick to your stomach (nauseous).  You are throwing up. Summary  A urinary tract infection (UTI) is an infection of any part of the urinary tract.  This condition is caused by germs in your genital area.  There are many risk factors for a UTI. These include having a small, thin   tube to drain pee and not being able to control when you pee or poop.  Treatment includes antibiotic medicines for germs.  Drink enough fluid to keep your pee pale yellow. This information is not intended to replace advice given to you by your health care provider. Make sure you discuss any questions you have with your health care provider. Document Released: 02/08/2008 Document Revised: 03/01/2018 Document Reviewed: 03/01/2018 Elsevier Interactive Patient Education  2019 Elsevier Inc.    Sulfamethoxazole; Trimethoprim, SMX-TMP tablets What is this medicine?  SULFAMETHOXAZOLE; TRIMETHOPRIM or SMX-TMP (suhl fuh meth OK suh zohl; trye METH oh prim) is a combination of a sulfonamide antibiotic and a second antibiotic, trimethoprim. It is used to treat or prevent certain kinds of bacterial infections. It will not work for colds, flu, or other viral infections. This medicine may be used for other purposes; ask your health care provider or pharmacist if you have questions. COMMON BRAND NAME(S): Bacter-Aid DS, Bactrim, Bactrim DS, Septra, Septra DS What should I tell my health care provider before I take this medicine? They need to know if you have any of these conditions: -anemia -asthma -being treated with anticonvulsants -if you frequently drink alcohol containing drinks -kidney disease -liver disease -low level of folic acid or [id number]-[id number] dehydrogenase -poor nutrition or malabsorption -porphyria -severe allergies -thyroid disorder -an unusual or allergic reaction to sulfamethoxazole, trimethoprim, sulfa drugs, other medicines, foods, dyes, or preservatives -pregnant or trying to get pregnant -breast-feeding How should I use this medicine? Take this medicine by mouth with a full glass of water. Follow the directions on the prescription label. Take your medicine at regular intervals. Do not take it more often than directed. Do not skip doses or stop your medicine early. Talk to your pediatrician regarding the use of this medicine in children. Special care may be needed. This medicine has been used in children as young as 80 months of age. Overdosage: If you think you have taken too much of this medicine contact a poison control center or emergency room at once. NOTE: This medicine is only for you. Do not share this medicine with others. What if I miss a dose? If you miss a dose, take it as soon as you can. If it is almost time for your next dose, take only that dose. Do not take double or extra doses. What may interact with this medicine?  Do not take this medicine with any of the following medications: -aminobenzoate potassium -dofetilide -metronidazole This medicine may also interact with the following medications: -ACE inhibitors like benazepril, enalapril, lisinopril, and ramipril -birth control pills -cyclosporine -digoxin -diuretics -indomethacin -medicines for diabetes -methenamine -methotrexate -phenytoin -potassium supplements -pyrimethamine -sulfinpyrazone -tricyclic antidepressants -warfarin This list may not describe all possible interactions. Give your health care provider a list of all the medicines, herbs, non-prescription drugs, or dietary supplements you use. Also tell them if you smoke, drink alcohol, or use illegal drugs. Some items may interact with your medicine. What should I watch for while using this medicine? Tell your doctor or health care professional if your symptoms do not improve. Drink several glasses of water a day to reduce the risk of kidney problems. Do not treat diarrhea with over the counter products. Contact your doctor if you have diarrhea that lasts more than 2 days or if it is severe and watery. This medicine can make you more sensitive to the sun. Keep out of the sun. If you cannot avoid being in the sun, wear protective clothing and use  a sunscreen. Do not use sun lamps or tanning beds/booths. What side effects may I notice from receiving this medicine? Side effects that you should report to your doctor or health care professional as soon as possible: -allergic reactions like skin rash or hives, swelling of the face, lips, or tongue -breathing problems -fever or chills, sore throat -irregular heartbeat, chest pain -joint or muscle pain -pain or difficulty passing urine -red pinpoint spots on skin -redness, blistering, peeling or loosening of the skin, including inside the mouth -unusual bleeding or bruising -unusually weak or tired -yellowing of the eyes or skin Side  effects that usually do not require medical attention (report to your doctor or health care professional if they continue or are bothersome): -diarrhea -dizziness -headache -loss of appetite -nausea, vomiting -nervousness This list may not describe all possible side effects. Call your doctor for medical advice about side effects. You may report side effects to FDA at 1-800-FDA-1088. Where should I keep my medicine? Keep out of the reach of children. Store at room temperature between 20 to 25 degrees C (68 to 77 degrees F). Protect from light. Throw away any unused medicine after the expiration date. NOTE: This sheet is a summary. It may not cover all possible information. If you have questions about this medicine, talk to your doctor, pharmacist, or health care provider.  2019 Elsevier/Gold Standard (2013-03-29 14:38:26)    Safe Sex Practicing safe sex means taking steps before and during sex to reduce your risk of:  Getting an STD (sexually transmitted disease).  Giving your partner an STD.  Unwanted pregnancy. How can I practice safe sex? To practice safe sex:  Limit your sexual partners to only one partner who is having sex with only you.  Avoid using alcohol and recreational drugs before having sex. These substances can affect your judgment.  Before having sex with a new partner: ? Talk to your partner about past partners, past STDs, and drug use. ? You and your partner should be screened for STDs and discuss the results with each other.  Check your body regularly for sores, blisters, rashes, or unusual discharge. If you notice any of these problems, visit your health care provider.  If you have symptoms of an infection or you are being treated for an STD, avoid sexual contact.  While having sex, use a condom. Make sure to: ? Use a condom every time you have vaginal, oral, or anal sex. Both females and males should wear condoms during oral sex. ? Keep condoms in place  from the beginning to the end of sexual activity. ? Use a latex condom, if possible. Latex condoms offer the best protection. ? Use only water-based lubricants or oils to lubricate a condom. Using petroleum-based lubricants or oils will weaken the condom and increase the chance that it will break.  See your health care provider for regular screenings, exams, and tests for STDs.  Talk with your health care provider about the form of birth control (contraception) that is best for you.  Get vaccinated against hepatitis B and human papillomavirus (HPV).  If you are at risk of being infected with HIV (human immunodeficiency virus), talk with your health care provider about taking a prescription medicine to prevent HIV infection. You are considered at risk for HIV if: ? You are a man who has sex with other men. ? You are a heterosexual man or woman who is sexually active with more than one partner. ? You take drugs by injection. ?  You are sexually active with a partner who has HIV. This information is not intended to replace advice given to you by your health care provider. Make sure you discuss any questions you have with your health care provider. Document Released: 09/29/2004 Document Revised: 01/06/2016 Document Reviewed: 07/12/2015 Elsevier Interactive Patient Education  2019 [employer].

## 2018-12-01 LAB — STD SCREEN (8)
HIV Screen 4th Generation wRfx: NONREACTIVE
HSV 1 Glycoprotein G Ab, IgG: 52.5 index — ABNORMAL HIGH (ref 0.00–0.90)
HSV 2 IgG, Type Spec: 21.1 index — ABNORMAL HIGH (ref 0.00–0.90)
Hep A IgM: NEGATIVE
Hep B C IgM: NEGATIVE
Hep C Virus Ab: <0.1 s/co ratio (ref 0.0–0.9)
Hepatitis B Surface Ag: NEGATIVE
RPR Ser Ql: NONREACTIVE

## 2018-12-02 LAB — URINE CULTURE

## 2018-12-05 ENCOUNTER — Other Ambulatory Visit: Payer: Self-pay | Admitting: Family Medicine

## 2018-12-05 ENCOUNTER — Encounter: Payer: Self-pay | Admitting: Family Medicine

## 2018-12-05 DIAGNOSIS — B009 Herpesviral infection, unspecified: Secondary | ICD-10-CM

## 2018-12-05 DIAGNOSIS — A599 Trichomoniasis, unspecified: Secondary | ICD-10-CM

## 2018-12-05 MED ORDER — VALACYCLOVIR HCL 1 G PO TABS: 1000.0000 mg | ORAL_TABLET | Freq: Two times a day (BID) | ORAL | 0 refills | Status: DC

## 2018-12-05 MED ORDER — METRONIDAZOLE 500 MG PO TABS: 500.0000 mg | ORAL_TABLET | Freq: Two times a day (BID) | ORAL | 0 refills | Status: AC

## 2018-12-07 LAB — NUSWAB VAGINITIS PLUS (VG+)
Candida albicans, NAA: NEGATIVE
Candida glabrata, NAA: NEGATIVE
Chlamydia trachomatis, NAA: NEGATIVE
Neisseria gonorrhoeae, NAA: NEGATIVE
Trich vag by NAA: POSITIVE — AB

## 2018-12-19 ENCOUNTER — Ambulatory Visit: Payer: Medicaid Other | Admitting: Family Medicine

## 2019-03-04 ENCOUNTER — Ambulatory Visit: Payer: Medicaid Other | Admitting: Family Medicine

## 2019-03-15 ENCOUNTER — Other Ambulatory Visit: Payer: Self-pay

## 2019-03-15 ENCOUNTER — Ambulatory Visit (INDEPENDENT_AMBULATORY_CARE_PROVIDER_SITE_OTHER): Payer: Self-pay | Admitting: Family Medicine

## 2019-03-15 ENCOUNTER — Encounter: Payer: Self-pay | Admitting: Family Medicine

## 2019-03-15 VITALS — BP 100/66 | HR 96 | Temp 98.4°F | Ht 69.0 in | Wt 229.0 lb

## 2019-03-15 DIAGNOSIS — N390 Urinary tract infection, site not specified: Secondary | ICD-10-CM

## 2019-03-15 DIAGNOSIS — Z09 Encounter for follow-up examination after completed treatment for conditions other than malignant neoplasm: Secondary | ICD-10-CM

## 2019-03-15 DIAGNOSIS — B009 Herpesviral infection, unspecified: Secondary | ICD-10-CM | POA: Insufficient documentation

## 2019-03-15 DIAGNOSIS — R319 Hematuria, unspecified: Secondary | ICD-10-CM

## 2019-03-15 DIAGNOSIS — G44209 Tension-type headache, unspecified, not intractable: Secondary | ICD-10-CM | POA: Insufficient documentation

## 2019-03-15 DIAGNOSIS — M62838 Other muscle spasm: Secondary | ICD-10-CM | POA: Insufficient documentation

## 2019-03-15 DIAGNOSIS — R8290 Unspecified abnormal findings in urine: Secondary | ICD-10-CM

## 2019-03-15 LAB — POCT URINALYSIS DIP (MANUAL ENTRY)
Bilirubin, UA: NEGATIVE
Glucose, UA: NEGATIVE mg/dL
Ketones, POC UA: NEGATIVE mg/dL
Nitrite, UA: NEGATIVE
Protein Ur, POC: NEGATIVE mg/dL
Spec Grav, UA: 1.025 (ref 1.010–1.025)
Urobilinogen, UA: 0.2 E.U./dL
pH, UA: 6.5 (ref 5.0–8.0)

## 2019-03-15 MED ORDER — CYCLOBENZAPRINE HCL 10 MG PO TABS: 10.0000 mg | ORAL_TABLET | Freq: Three times a day (TID) | ORAL | 3 refills | Status: DC | PRN

## 2019-03-15 MED ORDER — SULFAMETHOXAZOLE-TRIMETHOPRIM 800-160 MG PO TABS: 1.0000 | ORAL_TABLET | Freq: Two times a day (BID) | ORAL | 0 refills | Status: DC

## 2019-03-15 MED ORDER — BUTALBITAL-APAP-CAFFEINE 50-325-40 MG PO TABS: 1.0000 | ORAL_TABLET | Freq: Four times a day (QID) | ORAL | 0 refills | Status: DC | PRN

## 2019-03-15 MED FILL — SULFAMETHOXAZOLE-TMP DS TAB: 800-160 | 7 days supply | Qty: 14 | Fill #0

## 2019-03-15 MED FILL — CYCLOBENZAPRINE 10 MG TAB: 10 | 10 days supply | Qty: 30 | Fill #0

## 2019-03-15 MED FILL — BUTALB-ACETAMIN-CAFF 50-325: 50-325-40 | 3 days supply | Qty: 30 | Fill #0

## 2019-03-15 NOTE — Progress Notes (Signed)
Patient [facility]   Established Patient Office Visit  Subjective:  Patient ID: Judith Terry, female    DOB: Jul 20, 1996  Age: 23 y.o. MRN: [id number]  CC:  Chief Complaint  Patient presents with  . Follow-up    Chronic condition     HPI Judith Terry is a 23 year old female who presents for follow up today.   Past Medical History:  Diagnosis Date  . Anxiety   . Chronic back pain   . GERD (gastroesophageal reflux disease)   . Headache   . Herpes   . Muscle spasm   . Trichomoniasis    Current Status: Since her last office visit, she continues to have headaches frequently. Her last headache was on yesterday, which she takes Fioricet for pain relief. She denies visual changes, dizziness, and falls. Otherwise, she is doing well. Her anxiety is mild today. She denies suicidal ideations, homicidal ideations, or auditory hallucinations.  She denies fevers, chills, fatigue, recent infections, weight loss, and night sweats.  No chest pain, heart palpitations, cough and shortness of breath reported. No reports of GI problems such as nausea, vomiting, diarrhea, and constipation. She has no reports of blood in stools, dysuria and hematuria. She denies pain today.   Past Surgical History:  Procedure Laterality Date  . NO PAST SURGERIES      Family History  Problem Relation Age of Onset  . Diabetes Mother   . Diabetes Father   . Heart disease Father     Social History   Socioeconomic History  . Marital status: Single    Spouse name: Not on file  . Number of children: Not on file  . Years of education: Not on file  . Highest education level: Not on file  Occupational History  . Not on file  Social Needs  . Financial resource strain: Not on file  . Food insecurity    Worry: Not on file    Inability: Not on file  . Transportation needs    Medical: Not on file    Non-medical: Not on file  Tobacco Use  . Smoking status: Never  Smoker  . Smokeless tobacco: Never Used  Substance and Sexual Activity  . Alcohol use: No  . Drug use: No  . Sexual activity: Yes    Birth control/protection: None  Lifestyle  . Physical activity    Days per week: Not on file    Minutes per session: Not on file  . Stress: Not on file  Relationships  . Social Herbalist on phone: Not on file    Gets together: Not on file    Attends religious service: Not on file    Active member of club or organization: Not on file    Attends meetings of clubs or organizations: Not on file    Relationship status: Not on file  . Intimate partner violence    Fear of current or ex partner: Not on file    Emotionally abused: Not on file    Physically abused: Not on file    Forced sexual activity: Not on file  Other Topics Concern  . Not on file  Social History Narrative  . Not on file    Outpatient Medications Prior to Visit  Medication Sig Dispense Refill  . busPIRone (BUSPAR) 15 MG tablet Take 1 tablet (15 mg total) by mouth 2 (two) times daily. 60 tablet 3  . omeprazole (PRILOSEC OTC) 20 MG  tablet Take 1 tablet (20 mg total) by mouth daily. 30 tablet 3  . valACYclovir (VALTREX) 1000 MG tablet Take 1 tablet (1,000 mg total) by mouth 2 (two) times daily. 20 tablet 0  . cyclobenzaprine (FLEXERIL) 10 MG tablet Take 1 tablet (10 mg total) by mouth 3 (three) times daily as needed for muscle spasms. 30 tablet 0  . diphenhydrAMINE (BENADRYL) 25 mg capsule Take 25 mg by mouth every 6 (six) hours as needed for itching.     No facility-administered medications prior to visit.     No Known Allergies  ROS Review of Systems  Constitutional: Negative.   HENT: Negative.   Eyes: Negative.   Respiratory: Negative.   Cardiovascular: Negative.   Gastrointestinal: Positive for abdominal distention.  Endocrine: Negative.   Genitourinary: Negative.   Musculoskeletal: Negative.   Skin: Negative.   Allergic/Immunologic: Negative.    Neurological: Positive for headaches (occasional).  Hematological: Negative.   Psychiatric/Behavioral: Negative.     Objective:    Physical Exam  Constitutional: She is oriented to person, place, and time. She appears well-developed and well-nourished.  HENT:  Head: Normocephalic and atraumatic.  Eyes: Conjunctivae are normal.  Neck: Normal range of motion. Neck supple.  Cardiovascular: Normal rate, regular rhythm, normal heart sounds and intact distal pulses.  Pulmonary/Chest: Effort normal and breath sounds normal.  Abdominal: Soft. Bowel sounds are normal.  Musculoskeletal: Normal range of motion.  Neurological: She is alert and oriented to person, place, and time. She has normal reflexes.  Skin: Skin is warm and dry.  Psychiatric: She has a normal mood and affect. Her behavior is normal. Judgment and thought content normal.  Nursing note and vitals reviewed.   BP 100/66 (BP Location: Left Arm, Patient Position: Sitting, Cuff Size: Large)   Pulse 96   Temp 98.4 F (36.9 C) (Oral)   Ht 5\' 9"  (1.753 m)   Wt 229 lb (103.9 kg)   LMP 03/06/2019   SpO2 100%   BMI 33.82 kg/m  Wt Readings from Last 3 Encounters:  03/15/19 229 lb (103.9 kg)  11/30/18 235 lb (106.6 kg)  09/19/18 232 lb (105.2 kg)     Health Maintenance Due  Topic Date Due  . CHLAMYDIA SCREENING  05/18/2016    There are no preventive care reminders to display for this patient.  Lab Results  Component Value Date   TSH 2.150 12/27/2017   Lab Results  Component Value Date   WBC 7.3 01/13/2017   HGB 13.2 01/13/2017   HCT 41.0 01/13/2017   MCV 95.3 01/13/2017   PLT 237 01/13/2017   Lab Results  Component Value Date   NA 139 12/27/2017   K 4.6 12/27/2017   CO2 23 12/27/2017   GLUCOSE 97 12/27/2017   BUN 17 12/27/2017   CREATININE 0.59 12/27/2017   BILITOT 0.2 12/27/2017   ALKPHOS 81 12/27/2017   AST 11 12/27/2017   ALT 11 12/27/2017   PROT 7.0 12/27/2017   ALBUMIN 4.5 12/27/2017   CALCIUM  9.0 12/27/2017   ANIONGAP 8 07/24/2016   No results found for: CHOL No results found for: HDL No results found for: LDLCALC No results found for: TRIG No results found for: [facility] Lab Results  Component Value Date   HGBA1C 5.0 12/27/2017      Assessment & Plan:   1. Tension headache - butalbital-acetaminophen-caffeine (FIORICET) 50-325-40 MG tablet; Take 1-2 tablets by mouth every 6 (six) hours as needed for headache.  Dispense: 30 tablet; Refill: 0  2. Muscle spasm - cyclobenzaprine (FLEXERIL) 10 MG tablet; Take 1 tablet (10 mg total) by mouth 3 (three) times daily as needed for muscle spasms.  Dispense: 30 tablet; Refill: 3  3. Herpes Stable. She has no recent report of flare ups. She will continue to use Valtrex as needed.  4. Abnormal urinalysis Lab results are pending.  - Urine Culture  5. Urinary tract infection with hematuria, site unspecified We will initiate Bactrim today.  - sulfamethoxazole-trimethoprim (BACTRIM DS) 800-160 MG tablet; Take 1 tablet by mouth 2 (two) times daily.  Dispense: 14 tablet; Refill: 0  6. Follow up She will follow up in 8 months.  - POCT urinalysis dipstick  Meds ordered this encounter  Medications  . sulfamethoxazole-trimethoprim (BACTRIM DS) 800-160 MG tablet    Sig: Take 1 tablet by mouth 2 (two) times daily.    Dispense:  14 tablet    Refill:  0  . butalbital-acetaminophen-caffeine (FIORICET) 50-325-40 MG tablet    Sig: Take 1-2 tablets by mouth every 6 (six) hours as needed for headache.    Dispense:  30 tablet    Refill:  0  . cyclobenzaprine (FLEXERIL) 10 MG tablet    Sig: Take 1 tablet (10 mg total) by mouth 3 (three) times daily as needed for muscle spasms.    Dispense:  30 tablet    Refill:  3    Orders Placed This Encounter  Procedures  . Urine Culture  . POCT urinalysis dipstick    Referral Orders  No referral(s) requested today    Raliegh IpNatalie Sinai Mahany,  MSN, FNP-BC [facility] [address]  [place], [place] [id number] [phone]   Problem List Items Addressed This Visit    None    Visit Diagnoses    Tension headache    -  Primary   Relevant Medications   butalbital-acetaminophen-caffeine (FIORICET) 50-325-40 MG tablet   cyclobenzaprine (FLEXERIL) 10 MG tablet   Muscle spasm       Relevant Medications   cyclobenzaprine (FLEXERIL) 10 MG tablet   Herpes       Relevant Medications   sulfamethoxazole-trimethoprim (BACTRIM DS) 800-160 MG tablet   Abnormal urinalysis       Relevant Orders   Urine Culture   Urinary tract infection with hematuria, site unspecified       Relevant Medications   sulfamethoxazole-trimethoprim (BACTRIM DS) 800-160 MG tablet   Follow up       Relevant Orders   POCT urinalysis dipstick (Completed)      Meds ordered this encounter  Medications  . sulfamethoxazole-trimethoprim (BACTRIM DS) 800-160 MG tablet    Sig: Take 1 tablet by mouth 2 (two) times daily.    Dispense:  14 tablet    Refill:  0  . butalbital-acetaminophen-caffeine (FIORICET) 50-325-40 MG tablet    Sig: Take 1-2 tablets by mouth every 6 (six) hours as needed for headache.    Dispense:  30 tablet    Refill:  0  . cyclobenzaprine (FLEXERIL) 10 MG tablet    Sig: Take 1 tablet (10 mg total) by mouth 3 (three) times daily as needed for muscle spasms.    Dispense:  30 tablet    Refill:  3    Follow-up: Return in about 8 months (around 11/13/2019).    Kallie LocksNatalie M Etana Beets, FNP

## 2019-03-15 NOTE — Patient Instructions (Signed)
Urinary Tract Infection, Adult A urinary tract infection (UTI) is an infection of any part of the urinary tract. The urinary tract includes:  The kidneys.  The ureters.  The bladder.  The urethra. These organs make, store, and get rid of pee (urine) in the body. What are the causes? This is caused by germs (bacteria) in your genital area. These germs grow and cause swelling (inflammation) of your urinary tract. What increases the risk? You are more likely to develop this condition if:  You have a small, thin tube (catheter) to drain pee.  You cannot control when you pee or poop (incontinence).  You are female, and: ? You use these methods to prevent pregnancy: ? A medicine that kills sperm (spermicide). ? A device that blocks sperm (diaphragm). ? You have low levels of a female hormone (estrogen). ? You are pregnant.  You have genes that add to your risk.  You are sexually active.  You take antibiotic medicines.  You have trouble peeing because of: ? A prostate that is bigger than normal, if you are female. ? A blockage in the part of your body that drains pee from the bladder (urethra). ? A kidney stone. ? A nerve condition that affects your bladder (neurogenic bladder). ? Not getting enough to drink. ? Not peeing often enough.  You have other conditions, such as: ? Diabetes. ? A weak disease-fighting system (immune system). ? Sickle cell disease. ? Gout. ? Injury of the spine. What are the signs or symptoms? Symptoms of this condition include:  Needing to pee right away (urgently).  Peeing often.  Peeing small amounts often.  Pain or burning when peeing.  Blood in the pee.  Pee that smells bad or not like normal.  Trouble peeing.  Pee that is cloudy.  Fluid coming from the vagina, if you are female.  Pain in the belly or lower back. Other symptoms include:  Throwing up (vomiting).  No urge to eat.  Feeling mixed up (confused).  Being tired  and grouchy (irritable).  A fever.  Watery poop (diarrhea). How is this treated? This condition may be treated with:  Antibiotic medicine.  Other medicines.  Drinking enough water. Follow these instructions at home:  Medicines  Take over-the-counter and prescription medicines only as told by your doctor.  If you were prescribed an antibiotic medicine, take it as told by your doctor. Do not stop taking it even if you start to feel better. General instructions  Make sure you: ? Pee until your bladder is empty. ? Do not hold pee for a long time. ? Empty your bladder after sex. ? Wipe from front to back after pooping if you are a female. Use each tissue one time when you wipe.  Drink enough fluid to keep your pee pale yellow.  Keep all follow-up visits as told by your doctor. This is important. Contact a doctor if:  You do not get better after 1-2 days.  Your symptoms go away and then come back. Get help right away if:  You have very bad back pain.  You have very bad pain in your lower belly.  You have a fever.  You are sick to your stomach (nauseous).  You are throwing up. Summary  A urinary tract infection (UTI) is an infection of any part of the urinary tract.  This condition is caused by germs in your genital area.  There are many risk factors for a UTI. These include having a small, thin   tube to drain pee and not being able to control when you pee or poop.  Treatment includes antibiotic medicines for germs.  Drink enough fluid to keep your pee pale yellow. This information is not intended to replace advice given to you by your health care provider. Make sure you discuss any questions you have with your health care provider. Document Released: 02/08/2008 Document Revised: 08/09/2018 Document Reviewed: 03/01/2018 Elsevier Patient Education  2020 Elsevier Inc. Sulfamethoxazole; Trimethoprim, SMX-TMP tablets What is this medicine? SULFAMETHOXAZOLE;  TRIMETHOPRIM or SMX-TMP (suhl fuh meth OK suh zohl; trye METH oh prim) is a combination of a sulfonamide antibiotic and a second antibiotic, trimethoprim. It is used to treat or prevent certain kinds of bacterial infections. It will not work for colds, flu, or other viral infections. This medicine may be used for other purposes; ask your health care provider or pharmacist if you have questions. COMMON BRAND NAME(S): Bacter-Aid DS, Bactrim, Bactrim DS, Septra, Septra DS What should I tell my health care provider before I take this medicine? They need to know if you have any of these conditions:  anemia  asthma  being treated with anticonvulsants  if you frequently drink alcohol containing drinks  kidney disease  liver disease  low level of folic acid or glucose-6-phosphate dehydrogenase  poor nutrition or malabsorption  porphyria  severe allergies  thyroid disorder  an unusual or allergic reaction to sulfamethoxazole, trimethoprim, sulfa drugs, other medicines, foods, dyes, or preservatives  pregnant or trying to get pregnant  breast-feeding How should I use this medicine? Take this medicine by mouth with a full glass of water. Follow the directions on the prescription label. Take your medicine at regular intervals. Do not take it more often than directed. Do not skip doses or stop your medicine early. Talk to your pediatrician regarding the use of this medicine in children. Special care may be needed. This medicine has been used in children as young as 2 months of age. Overdosage: If you think you have taken too much of this medicine contact a poison control center or emergency room at once. NOTE: This medicine is only for you. Do not share this medicine with others. What if I miss a dose? If you miss a dose, take it as soon as you can. If it is almost time for your next dose, take only that dose. Do not take double or extra doses. What may interact with this medicine? Do not  take this medicine with any of the following medications:  aminobenzoate potassium  dofetilide  metronidazole This medicine may also interact with the following medications:  ACE inhibitors like benazepril, enalapril, lisinopril, and ramipril  birth control pills  cyclosporine  digoxin  diuretics  indomethacin  medicines for diabetes  methenamine  methotrexate  phenytoin  potassium supplements  pyrimethamine  sulfinpyrazone  tricyclic antidepressants  warfarin This list may not describe all possible interactions. Give your health care provider a list of all the medicines, herbs, non-prescription drugs, or dietary supplements you use. Also tell them if you smoke, drink alcohol, or use illegal drugs. Some items may interact with your medicine. What should I watch for while using this medicine? Tell your doctor or health care professional if your symptoms do not improve. Drink several glasses of water a day to reduce the risk of kidney problems. Do not treat diarrhea with over the counter products. Contact your doctor if you have diarrhea that lasts more than 2 days or if it is severe and watery.   This medicine can make you more sensitive to the sun. Keep out of the sun. If you cannot avoid being in the sun, wear protective clothing and use a sunscreen. Do not use sun lamps or tanning beds/booths. What side effects may I notice from receiving this medicine? Side effects that you should report to your doctor or health care professional as soon as possible:  allergic reactions like skin rash or hives, swelling of the face, lips, or tongue  breathing problems  fever or chills, sore throat  irregular heartbeat, chest pain  joint or muscle pain  pain or difficulty passing urine  red pinpoint spots on skin  redness, blistering, peeling or loosening of the skin, including inside the mouth  unusual bleeding or bruising  unusually weak or tired  yellowing of the  eyes or skin Side effects that usually do not require medical attention (report to your doctor or health care professional if they continue or are bothersome):  diarrhea  dizziness  headache  loss of appetite  nausea, vomiting  nervousness This list may not describe all possible side effects. Call your doctor for medical advice about side effects. You may report side effects to FDA at 1-800-FDA-1088. Where should I keep my medicine? Keep out of the reach of children. Store at room temperature between 20 to 25 degrees C (68 to 77 degrees F). Protect from light. Throw away any unused medicine after the expiration date. NOTE: This sheet is a summary. It may not cover all possible information. If you have questions about this medicine, talk to your doctor, pharmacist, or health care provider.  2020 Elsevier/Gold Standard (2013-03-29 14:38:26)  

## 2019-03-18 LAB — URINE CULTURE

## 2019-03-29 MED FILL — busPIRone HCL 15 MG TABS: 15 | 30 days supply | Qty: 60 | Fill #1

## 2019-04-22 ENCOUNTER — Other Ambulatory Visit: Payer: Self-pay | Admitting: Family Medicine

## 2019-04-22 DIAGNOSIS — G44209 Tension-type headache, unspecified, not intractable: Secondary | ICD-10-CM

## 2019-04-23 NOTE — Telephone Encounter (Signed)
Refill request for fioricet. Please advise.

## 2019-04-24 ENCOUNTER — Other Ambulatory Visit: Payer: Self-pay | Admitting: Family Medicine

## 2019-04-24 DIAGNOSIS — G44209 Tension-type headache, unspecified, not intractable: Secondary | ICD-10-CM

## 2019-04-24 MED ORDER — BUTALBITAL-APAP-CAFFEINE 50-325-40 MG PO TABS: 1.0000 | ORAL_TABLET | Freq: Four times a day (QID) | ORAL | 0 refills | Status: AC | PRN

## 2019-04-24 NOTE — Telephone Encounter (Signed)
-----   Message from Azzie Glatter, [place] sent at 04/24/2019  7:31 AM EDT ----- Regarding: "Refill" Rx for Fioricet sent to pharmacy today. Please inform patient. Thank you.

## 2019-04-24 NOTE — Telephone Encounter (Signed)
Called and left a message that medication has been sent to pharmacy. Thanks!

## 2019-04-29 MED FILL — CYCLOBENZAPRINE 10 MG TAB: 10 | 10 days supply | Qty: 30 | Fill #1

## 2019-05-01 ENCOUNTER — Other Ambulatory Visit: Payer: Self-pay | Admitting: Family Medicine

## 2019-05-01 ENCOUNTER — Encounter: Payer: Self-pay | Admitting: Family Medicine

## 2019-05-01 DIAGNOSIS — G44209 Tension-type headache, unspecified, not intractable: Secondary | ICD-10-CM

## 2019-05-01 MED FILL — BUTALB-ACETAMIN-CAFF 50-325: 50-325-40 | 4 days supply | Qty: 30 | Fill #0

## 2019-05-20 MED FILL — BUTALB-ACETAMIN-CAFF 50-325: 50-325-40 | 4 days supply | Qty: 30 | Fill #1

## 2019-05-20 MED FILL — busPIRone HCL 15 MG TABS: 15 | 30 days supply | Qty: 60 | Fill #2

## 2019-05-30 MED FILL — CYCLOBENZAPRINE 10 MG TAB: 10 | 10 days supply | Qty: 30 | Fill #2

## 2019-06-07 MED FILL — BUTALB-ACETAMIN-CAFF 50-325: 50-325-40 | 4 days supply | Qty: 30 | Fill #1

## 2019-06-07 MED FILL — busPIRone HCL 15 MG TABS: 15 | 30 days supply | Qty: 60 | Fill #2

## 2019-06-27 MED FILL — CYCLOBENZAPRINE 10 MG TAB: 10 | 10 days supply | Qty: 30 | Fill #2

## 2019-06-27 MED FILL — BUTALB-ACETAMIN-CAFF 50-325: 50-325-40 | 4 days supply | Qty: 30 | Fill #2

## 2019-07-02 ENCOUNTER — Inpatient Hospital Stay (HOSPITAL_COMMUNITY): Payer: Medicaid Other

## 2019-07-02 ENCOUNTER — Inpatient Hospital Stay (HOSPITAL_COMMUNITY)
Admission: AD | Admit: 2019-07-02 | Discharge: 2019-07-02 | Disposition: A | Discharge status: Home / Self Care | Payer: Medicaid Other | Attending: Obstetrics and Gynecology | Admitting: Obstetrics and Gynecology

## 2019-07-02 ENCOUNTER — Other Ambulatory Visit: Payer: Self-pay

## 2019-07-02 ENCOUNTER — Encounter (HOSPITAL_COMMUNITY): Payer: Self-pay | Admitting: Emergency Medicine

## 2019-07-02 DIAGNOSIS — Z3A01 Less than 8 weeks gestation of pregnancy: Secondary | ICD-10-CM | POA: Diagnosis not present

## 2019-07-02 DIAGNOSIS — F419 Anxiety disorder, unspecified: Secondary | ICD-10-CM | POA: Diagnosis not present

## 2019-07-02 DIAGNOSIS — O99611 Diseases of the digestive system complicating pregnancy, first trimester: Secondary | ICD-10-CM | POA: Insufficient documentation

## 2019-07-02 DIAGNOSIS — O2341 Unspecified infection of urinary tract in pregnancy, first trimester: Secondary | ICD-10-CM | POA: Diagnosis not present

## 2019-07-02 DIAGNOSIS — Z833 Family history of diabetes mellitus: Secondary | ICD-10-CM | POA: Insufficient documentation

## 2019-07-02 DIAGNOSIS — O99891 Other specified diseases and conditions complicating pregnancy: Secondary | ICD-10-CM | POA: Diagnosis not present

## 2019-07-02 DIAGNOSIS — R109 Unspecified abdominal pain: Secondary | ICD-10-CM

## 2019-07-02 DIAGNOSIS — Z8249 Family history of ischemic heart disease and other diseases of the circulatory system: Secondary | ICD-10-CM | POA: Diagnosis not present

## 2019-07-02 DIAGNOSIS — O99341 Other mental disorders complicating pregnancy, first trimester: Secondary | ICD-10-CM | POA: Diagnosis not present

## 2019-07-02 DIAGNOSIS — Z79899 Other long term (current) drug therapy: Secondary | ICD-10-CM | POA: Insufficient documentation

## 2019-07-02 DIAGNOSIS — G8929 Other chronic pain: Secondary | ICD-10-CM | POA: Insufficient documentation

## 2019-07-02 DIAGNOSIS — O3680X0 Pregnancy with inconclusive fetal viability, not applicable or unspecified: Secondary | ICD-10-CM | POA: Insufficient documentation

## 2019-07-02 DIAGNOSIS — Z8759 Personal history of other complications of pregnancy, childbirth and the puerperium: Secondary | ICD-10-CM | POA: Insufficient documentation

## 2019-07-02 DIAGNOSIS — K219 Gastro-esophageal reflux disease without esophagitis: Secondary | ICD-10-CM | POA: Diagnosis not present

## 2019-07-02 DIAGNOSIS — O26891 Other specified pregnancy related conditions, first trimester: Secondary | ICD-10-CM

## 2019-07-02 HISTORY — DX: Gestational (pregnancy-induced) hypertension without significant proteinuria, unspecified trimester: O13.9

## 2019-07-02 HISTORY — DX: Unspecified infectious disease: B99.9

## 2019-07-02 LAB — WET PREP, GENITAL
Clue Cells Wet Prep HPF POC: NONE SEEN
Sperm: NONE SEEN
Trich, Wet Prep: NONE SEEN
Yeast Wet Prep HPF POC: NONE SEEN

## 2019-07-02 LAB — CBC
HCT: 40.2 % (ref 36.0–46.0)
Hemoglobin: 13 g/dL (ref 12.0–15.0)
MCH: 31.8 pg (ref 26.0–34.0)
MCHC: 32.3 g/dL (ref 30.0–36.0)
MCV: 98.3 fL (ref 80.0–100.0)
Platelets: 234 10*3/uL (ref 150–400)
RBC: 4.09 MIL/uL (ref 3.87–5.11)
RDW: 12.6 % (ref 11.5–15.5)
WBC: 9.1 10*3/uL (ref 4.0–10.5)
nRBC: 0 % (ref 0.0–0.2)

## 2019-07-02 LAB — URINALYSIS, ROUTINE W REFLEX MICROSCOPIC
Bilirubin Urine: NEGATIVE
Glucose, UA: NEGATIVE mg/dL
Hgb urine dipstick: NEGATIVE
Ketones, ur: 5 mg/dL — AB
Nitrite: POSITIVE — AB
Protein, ur: NEGATIVE mg/dL
Specific Gravity, Urine: 1.031 — ABNORMAL HIGH (ref 1.005–1.030)
pH: 5 (ref 5.0–8.0)

## 2019-07-02 LAB — I-STAT BETA HCG BLOOD, ED (MC, WL, AP ONLY): I-stat hCG, quantitative: 224.8 m[IU]/mL — ABNORMAL HIGH (ref <5)

## 2019-07-02 LAB — HCG, QUANTITATIVE, PREGNANCY: hCG, Beta Chain, Quant, S: 238 m[IU]/mL — ABNORMAL HIGH (ref <5)

## 2019-07-02 LAB — POCT PREGNANCY, URINE: Preg Test, Ur: POSITIVE — AB

## 2019-07-02 LAB — POC URINE PREG, ED
Preg Test, Ur: NEGATIVE
Preg Test, Ur: NEGATIVE

## 2019-07-02 MED ORDER — CEPHALEXIN 500 MG PO CAPS: 500.0000 mg | ORAL_CAPSULE | Freq: Four times a day (QID) | ORAL | 0 refills | Status: DC

## 2019-07-02 MED FILL — CEPHALEXIN 500 MG CAPSULE: 500 | 7 days supply | Qty: 28 | Fill #0

## 2019-07-02 NOTE — ED Triage Notes (Signed)
[facility] called to [facility] . Transport here to take Pt vis [facility] to [facility] admissions

## 2019-07-02 NOTE — Discharge Instructions (Signed)
Return to care   If you have heavier bleeding that soaks through more that 2 pads per hour for an hour or more  If you bleed so much that you feel like you might pass out or you do pass out  If you have significant abdominal pain that is not improved with Tylenol   If you develop a fever > 100.5   Pregnancy and Urinary Tract Infection  A urinary tract infection (UTI) is an infection of any part of the urinary tract. This includes the kidneys, the tubes that connect your kidneys to your bladder (ureters), the bladder, and the tube that carries urine out of your body (urethra). These organs make, store, and get rid of urine in the body. Your health care provider may use other names to describe the infection. An upper UTI affects the ureters and kidneys (pyelonephritis). A lower UTI affects the bladder (cystitis) and urethra (urethritis). Most urinary tract infections are caused by bacteria in your genital area, around the entrance to your urinary tract (urethra). These bacteria grow and cause irritation and inflammation of your urinary tract. You are more likely to develop a UTI during pregnancy because the physical and hormonal changes your body goes through can make it easier for bacteria to get into your urinary tract. Your growing baby also puts pressure on your bladder and can affect urine flow. It is important to recognize and treat UTIs in pregnancy because of the risk of serious complications for both you and your baby. How does this affect me? Symptoms of a UTI include:  Needing to urinate right away (urgently).  Frequent urination or passing small amounts of urine frequently.  Pain or burning with urination.  Blood in the urine.  Urine that smells bad or unusual.  Trouble urinating.  Cloudy urine.  Pain in the abdomen or lower back.  Vaginal discharge. You may also have:  Vomiting or a decreased appetite.  Confusion.  Irritability or tiredness.  A  fever.  Diarrhea. How does this affect my baby? An untreated UTI during pregnancy could lead to a kidney infection or a systemic infection, which can cause health problems that could affect your baby. Possible complications of an untreated UTI include:  Giving birth to your baby before 37 weeks of pregnancy (premature).  Having a baby with a low birth weight.  Developing high blood pressure during pregnancy (preeclampsia).  Having a low hemoglobin level (anemia). What can I do to lower my risk? To prevent a UTI:  Go to the bathroom as soon as you feel the need. Do not hold urine for long periods of time.  Always wipe from front to back, especially after a bowel movement. Use each tissue one time when you wipe.  Empty your bladder after sex.  Keep your genital area dry.  Drink 6-10 glasses of water each day.  Do not douche or use deodorant sprays. How is this treated? Treatment for this condition may include:  Antibiotic medicines that are safe to take during pregnancy.  Other medicines to treat less common causes of UTI. Follow these instructions at home:  If you were prescribed an antibiotic medicine, take it as told by your health care provider. Do not stop using the antibiotic even if you start to feel better.  Keep all follow-up visits as told by your health care provider. This is important. Contact a health care provider if:  Your symptoms do not improve or they get worse.  You have abnormal vaginal discharge.  Get help right away if you:  Have a fever.  Have nausea and vomiting.  Have back or side pain.  Feel contractions in your uterus.  Have lower belly pain.  Have a gush of fluid from your vagina.  Have blood in your urine. Summary  A urinary tract infection (UTI) is an infection of any part of the urinary tract, which includes the kidneys, ureters, bladder, and urethra.  Most urinary tract infections are caused by bacteria in your genital area,  around the entrance to your urinary tract (urethra).  You are more likely to develop a UTI during pregnancy.  If you were prescribed an antibiotic medicine, take it as told by your health care provider. Do not stop using the antibiotic even if you start to feel better. This information is not intended to replace advice given to you by your health care provider. Make sure you discuss any questions you have with your health care provider. Document Released: 12/17/2010 Document Revised: 12/14/2018 Document Reviewed: 07/26/2018 Elsevier Patient Education  2020 [employer]    [facility] Prenatal Care Providers   Center for [facility]       Phone: [phone]  Center for [facility] at [place] Phone: [phone]  [facility] [place]  Phone: [phone]  [facility] at [facility]  Phone: [phone]  Center for [facility] at [place]  Phone: [phone]  [facility]       Phone: [phone]  [facility]    Phone: [phone]   [facility] [place])    Phone: [phone]  [facility]    Phone: [phone]  [facility]    Phone: [phone]   [facility]  Phone: [phone]  [facility]    Phone: [phone]  [facility] [place]   Phone: [phone]  [facility]    Phone: [phone]    Safe Medications in Pregnancy   Acne: Benzoyl Peroxide Salicylic Acid  Backache/Headache: Tylenol: 2 regular strength every 4 hours OR              2 Extra strength every 6 hours  Colds/Coughs/Allergies: Benadryl (alcohol free) 25 mg every 6 hours as needed Breath right strips Claritin Cepacol throat lozenges Chloraseptic throat spray Cold-Eeze- up to three times per day Cough drops,  alcohol free Flonase (by prescription only) Guaifenesin Mucinex Robitussin DM (plain only, alcohol free) Saline nasal spray/drops Sudafed (pseudoephedrine) & Actifed ** use only after [redacted] weeks gestation and if you do not have high blood pressure Tylenol Vicks Vaporub Zinc lozenges Zyrtec   Constipation: Colace Ducolax suppositories Fleet enema Glycerin suppositories Metamucil Milk of magnesia Miralax Senokot Smooth move tea  Diarrhea: Kaopectate Imodium A-D  *NO pepto Bismol  Hemorrhoids: Anusol Anusol HC Preparation H Tucks  Indigestion: Tums Maalox Mylanta Zantac  Pepcid  Insomnia: Benadryl (alcohol free) 25mg  every 6 hours as needed Tylenol PM Unisom, no Gelcaps  Leg Cramps: Tums MagGel  Nausea/Vomiting:  Bonine Dramamine Emetrol Ginger extract Sea bands Meclizine  Nausea medication to take during pregnancy:  Unisom (doxylamine succinate 25 mg tablets) Take one tablet daily at bedtime. If symptoms are not adequately controlled, the dose can be increased to a maximum recommended dose of two tablets daily (1/2 tablet in the morning, 1/2 tablet mid-afternoon and one at bedtime). Vitamin B6 100mg  tablets. Take one tablet twice a day (up to 200 mg per  day).  Skin Rashes: Aveeno products Benadryl cream or  every 6 hours as needed Calamine Lotion 1% cortisone cream  Yeast infection: Gyne-lotrimin 7 Monistat 7  Gum/tooth pain: Anbesol  **If taking multiple medications, please check labels to avoid duplicating the same active ingredients **take medication as directed on the label ** Do not exceed 4000 mg of tylenol in 24 hours **Do not take medications that contain aspirin or ibuprofen

## 2019-07-02 NOTE — ED Provider Notes (Signed)
[facility] EMERGENCY DEPARTMENT Provider Note   CSN: [id number] Arrival date & time: 07/02/19  0815     History   Chief Complaint Chief Complaint  Patient presents with  . Possible Pregnancy    HPI Judith Terry is a 23 y.o. female.     HPI   23 year old female presents today with complaints of positive pregnancy test and abdominal pain.  She notes that she had her last normal menstrual cycle on September 24.  She notes 3 days ago she was was to have her repeat menstrual cycle but did not have it.  She notes minimal mid abdominal discomfort since that time.  She describes it as cramping similar to menstrual cramps.  She denies any vaginal bleeding or discharge, denies any nausea vomiting or fever.  Patient notes she is here to start her prenatal care and confirm pregnancy.  She notes her last pregnancy was 4 years ago normal vaginal delivery she has not been pregnant since that time. Past Medical History:  Diagnosis Date  . Anxiety   . Chronic back pain   . GERD (gastroesophageal reflux disease)   . Headache   . Herpes   . Muscle spasm   . Trichomoniasis     Patient Active Problem List   Diagnosis Date Noted  . Tension headache 03/15/2019  . Muscle spasm 03/15/2019  . Herpes 03/15/2019  . Vaginal discharge 11/30/2018  . Preeclampsia 06/03/2015  . 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 Fetal echogenic bowel of fetus   . Contact with or exposure to sexually transmitted disease 11/25/2014  . Supervision of normal first pregnancy in first trimester 11/25/2014  . GERD (gastroesophageal reflux disease) 11/25/2014    Past Surgical History:  Procedure Laterality Date  . NO PAST SURGERIES       OB History    Gravida  1   Para  1   Term  1   Preterm      AB      Living  1     SAB      TAB      Ectopic      Multiple  0   Live Births  1            Home Medications    Prior to  Admission medications   Medication Sig Start Date End Date Taking? Authorizing Provider  busPIRone (BUSPAR) 15 MG tablet Take 1 tablet (15 mg total) by mouth 2 (two) times daily. 11/13/18   Kallie Locks, FNP  butalbital-acetaminophen-caffeine (FIORICET) [phone] MG tablet Take 1-2 tablets by mouth every 6 (six) hours as needed for headache. 04/24/19 07/23/19  Kallie Locks, FNP  cyclobenzaprine (FLEXERIL) 10 MG tablet Take 1 tablet (10 mg total) by mouth 3 (three) times daily as needed for muscle spasms. 03/15/19   Kallie Locks, FNP  omeprazole (PRILOSEC OTC) 20 MG tablet Take 1 tablet (20 mg total) by mouth daily. 11/13/18   Kallie Locks, FNP  sulfamethoxazole-trimethoprim (BACTRIM DS) 800-160 MG tablet Take 1 tablet by mouth 2 (two) times daily. 03/15/19   Kallie Locks, FNP  valACYclovir (VALTREX) 1000 MG tablet Take 1 tablet (1,000 mg total) by mouth 2 (two) times daily. 12/05/18   Kallie Locks, FNP    Family History Family History  Problem Relation Age of Onset  . Diabetes Mother   . Diabetes Father   . Heart disease Father  Social History Social History   Tobacco Use  . Smoking status: Never Smoker  . Smokeless tobacco: Never Used  Substance Use Topics  . Alcohol use: No  . Drug use: No     Allergies   Patient has no known allergies.   Review of Systems Review of Systems  All other systems reviewed and are negative.    Physical Exam Updated Vital Signs BP 121/80 (BP Location: Right Arm)   Pulse 74   Temp 98.3 F (36.8 C) (Oral)   Resp 20   Ht 5\' 9"  (1.753 m)   Wt 107.5 kg   LMP 05/29/2019   SpO2 100%   BMI 35.00 kg/m   Physical Exam Vitals signs and nursing note reviewed.  Constitutional:      Appearance: She is well-developed.  HENT:     Head: Normocephalic and atraumatic.  Eyes:     General: No scleral icterus.       Right eye: No discharge.        Left eye: No discharge.     Conjunctiva/sclera: Conjunctivae normal.      Pupils: Pupils are equal, round, and reactive to light.  Neck:     Musculoskeletal: Normal range of motion.     Vascular: No JVD.     Trachea: No tracheal deviation.  Pulmonary:     Effort: Pulmonary effort is normal.     Breath sounds: No stridor.  Abdominal:     General: Abdomen is flat. There is no distension.     Palpations: Abdomen is soft.     Tenderness: There is no abdominal tenderness.  Neurological:     Mental Status: She is alert and oriented to person, place, and time.     Coordination: Coordination normal.  Psychiatric:        Behavior: Behavior normal.        Thought Content: Thought content normal.        Judgment: Judgment normal.     ED Treatments / Results  Labs (all labs ordered are listed, but only abnormal results are displayed) Labs Reviewed  I-STAT BETA HCG BLOOD, ED ([facility], [facility], [facility] ONLY) - Abnormal; Notable for the following components:      Result Value   I-stat hCG, quantitative 224.8 (*)    All other components within normal limits  POC URINE PREG, ED  POC URINE PREG, ED    EKG None  Radiology No results found.  Procedures Procedures (including critical care time)  Medications Ordered in ED Medications - No data to display   Initial Impression / Assessment and Plan / ED Course  I have reviewed the triage vital signs and the nursing notes.  Pertinent labs & imaging results that were available during my care of the patient were reviewed by me and considered in my medical decision making (see chart for details).       Assessment/Plan: 23 year old female presents today with early pregnancy.  Her i-STAT beta-hCG is positive here, she endorses minor abdominal cramping.  Patient will be transferred to the MAU for further evaluation and management.  She has no fever nausea vomiting or any signs of infectious etiology low suspicion for any acute life-threatening intra-abdominal pathology including appendicitis.   Final Clinical Impressions(s) /  ED Diagnoses   Final diagnoses:  Abdominal pain during pregnancy in first trimester    ED Discharge Orders    None       Rosalio LoudHedges, Gino Garrabrant, PA-C 07/02/19 1206    Sabas SousBero, Michael M,  MD 07/03/19 1223

## 2019-07-02 NOTE — ED Triage Notes (Signed)
Pt here for a pregnancy test. Pt reports she has had 6 positive tests but needs a confirmed diagnosis to start prenatal care. Denies vaginal bleeding/discharge. LMP 05/29/2019.

## 2019-07-02 NOTE — MAU Note (Signed)
Cramping last few days.  Period was late, 6+ HPT over weekend.  Confirmed in ED, sent up for further eval.

## 2019-07-02 NOTE — ED Triage Notes (Signed)
PT reports 4 days of ABD cramping and denies any vag bleeding or discharge.

## 2019-07-02 NOTE — MAU Provider Note (Signed)
Chief Complaint: Possible Pregnancy and Abdominal Pain   First Provider Initiated Contact with Patient 07/02/19 1344     SUBJECTIVE HPI: Judith Terry is a 23 y.o. G2P1001 at [redacted]w[redacted]d who presents to Maternity Admissions reporting abdominal cramping. Has had mild intermittent abdominal cramping x 3 days. Denies n/v/d, dysuria, vaginal discharge, or vaginal bleeding. Was at the ED this morning; had a negative UPT but a positive I-stat HCG.   Location: abdomen Quality: cramping Severity: 3/10 on pain scale Duration: 3 days Timing: intermittent Modifying factors: none Associated signs and symptoms: none  Past Medical History:  Diagnosis Date  . Anxiety   . Chronic back pain   . GERD (gastroesophageal reflux disease)   . Headache   . Herpes   . Infection    UTI  . Muscle spasm   . Pregnancy induced hypertension   . Trichomoniasis    OB History  Gravida Para Term Preterm AB Living  2 1 1     1   SAB TAB Ectopic Multiple Live Births        0 1    # Outcome Date GA Lbr Len/2nd Weight Sex Delivery Anes PTL Lv  2 Current           1 Term 06/04/15 [redacted]w[redacted]d / 00:22 3050 g F Vag-Spont None  LIV   Past Surgical History:  Procedure Laterality Date  . NO PAST SURGERIES     Social History   Socioeconomic History  . Marital status: Single    Spouse name: Not on file  . Number of children: Not on file  . Years of education: Not on file  . Highest education level: Not on file  Occupational History  . Not on file  Social Needs  . Financial resource strain: Not on file  . Food insecurity    Worry: Not on file    Inability: Not on file  . Transportation needs    Medical: Not on file    Non-medical: Not on file  Tobacco Use  . Smoking status: Never Smoker  . Smokeless tobacco: Never Used  Substance and Sexual Activity  . Alcohol use: No  . Drug use: No  . Sexual activity: Yes    Birth control/protection: None  Lifestyle  . Physical activity    Days per week: Not on file     Minutes per session: Not on file  . Stress: Not on file  Relationships  . Social Herbalist on phone: Not on file    Gets together: Not on file    Attends religious service: Not on file    Active member of club or organization: Not on file    Attends meetings of clubs or organizations: Not on file    Relationship status: Not on file  . Intimate partner violence    Fear of current or ex partner: Not on file    Emotionally abused: Not on file    Physically abused: Not on file    Forced sexual activity: Not on file  Other Topics Concern  . Not on file  Social History Narrative  . Not on file   Family History  Problem Relation Age of Onset  . Diabetes Mother   . Diabetes Father   . Heart disease Father    No current facility-administered medications on file prior to encounter.    Current Outpatient Medications on File Prior to Encounter  Medication Sig Dispense Refill  . busPIRone (BUSPAR) 15 MG tablet Take  1 tablet (15 mg total) by mouth 2 (two) times daily. 60 tablet 3  . butalbital-acetaminophen-caffeine (FIORICET) 50-325-40 MG tablet Take 1-2 tablets by mouth every 6 (six) hours as needed for headache. 30 tablet 0  . cyclobenzaprine (FLEXERIL) 10 MG tablet Take 1 tablet (10 mg total) by mouth 3 (three) times daily as needed for muscle spasms. 30 tablet 3  . omeprazole (PRILOSEC OTC) 20 MG tablet Take 1 tablet (20 mg total) by mouth daily. 30 tablet 3  . sulfamethoxazole-trimethoprim (BACTRIM DS) 800-160 MG tablet Take 1 tablet by mouth 2 (two) times daily. 14 tablet 0  . valACYclovir (VALTREX) 1000 MG tablet Take 1 tablet (1,000 mg total) by mouth 2 (two) times daily. 20 tablet 0   No Known Allergies  I have reviewed patient's Past Medical Hx, Surgical Hx, Family Hx, Social Hx, medications and allergies.   Review of Systems  Constitutional: Negative.   Gastrointestinal: Positive for abdominal pain. Negative for constipation, diarrhea, nausea and vomiting.   Genitourinary: Negative.     OBJECTIVE Patient Vitals for the past 24 hrs:  BP Temp Temp src Pulse Resp SpO2 Height Weight  07/02/19 1255 137/79 - - 80 - 100 % 5\' 9"  (1.753 m) 107.7 kg  07/02/19 1050 121/80 98.3 F (36.8 C) Oral 74 20 100 % - -  07/02/19 07/04/19 - - - - - - 5\' 9"  (1.753 m) 107.5 kg  07/02/19 0821 134/84 98.5 F (36.9 C) Oral (!) 107 16 99 % - -   Constitutional: Well-developed, well-nourished female in no acute distress.  Cardiovascular: normal rate & rhythm, no murmur Respiratory: normal rate and effort. Lung sounds clear throughout GI: Abd soft, non-tender, Pos BS x 4. No guarding or rebound tenderness MS: Extremities nontender, no edema, normal ROM Neurologic: Alert and oriented x 4.      LAB RESULTS Results for orders placed or performed during the hospital encounter of 07/02/19 (from the past 24 hour(s))  POC Urine Pregnancy, ED (not at [facility])     Status: None   Collection Time: 07/02/19  9:57 AM  Result Value Ref Range   Preg Test, Ur NEGATIVE NEGATIVE  I-Stat Beta hCG blood, ED ([facility], [facility], [facility] only)     Status: Abnormal   Collection Time: 07/02/19 11:30 AM  Result Value Ref Range   I-stat hCG, quantitative 224.8 (H) <5 mIU/mL   Comment 3          POC urine preg, ED (not at [facility])     Status: None   Collection Time: 07/02/19 11:37 AM  Result Value Ref Range   Preg Test, Ur NEGATIVE NEGATIVE  Pregnancy, urine POC     Status: Abnormal   Collection Time: 07/02/19  1:15 PM  Result Value Ref Range   Preg Test, Ur POSITIVE (A) NEGATIVE  Urinalysis, Routine w reflex microscopic     Status: Abnormal   Collection Time: 07/02/19  1:22 PM  Result Value Ref Range   Color, Urine YELLOW YELLOW   APPearance HAZY (A) CLEAR   Specific Gravity, Urine 1.031 (H) 1.005 - 1.030   pH 5.0 5.0 - 8.0   Glucose, UA NEGATIVE NEGATIVE mg/dL   Hgb urine dipstick NEGATIVE NEGATIVE   Bilirubin Urine NEGATIVE NEGATIVE   Ketones, ur 5 (A) NEGATIVE mg/dL   Protein, ur NEGATIVE NEGATIVE  mg/dL   Nitrite POSITIVE (A) NEGATIVE   Leukocytes,Ua LARGE (A) NEGATIVE   RBC / HPF 0-5 0 - 5 RBC/hpf   WBC, UA 11-20 0 -  5 WBC/hpf   Bacteria, UA MANY (A) NONE SEEN   Squamous Epithelial / LPF 0-5 0 - 5   Mucus PRESENT   Wet prep, genital     Status: Abnormal   Collection Time: 07/02/19  2:05 PM   Specimen: Vaginal  Result Value Ref Range   Yeast Wet Prep HPF POC NONE SEEN NONE SEEN   Trich, Wet Prep NONE SEEN NONE SEEN   Clue Cells Wet Prep HPF POC NONE SEEN NONE SEEN   WBC, Wet Prep HPF POC MODERATE (A) NONE SEEN   Sperm NONE SEEN   CBC     Status: None   Collection Time: 07/02/19  2:07 PM  Result Value Ref Range   WBC 9.1 4.0 - 10.5 K/uL   RBC 4.09 3.87 - 5.11 MIL/uL   Hemoglobin 13.0 12.0 - 15.0 g/dL   HCT 40.9 81.1 - 91.4 %   MCV 98.3 80.0 - 100.0 fL   MCH 31.8 26.0 - 34.0 pg   MCHC 32.3 30.0 - 36.0 g/dL   RDW 78.2 95.6 - 21.3 %   Platelets 234 150 - 400 K/uL   nRBC 0.0 0.0 - 0.2 %  hCG, quantitative, pregnancy     Status: Abnormal   Collection Time: 07/02/19  2:07 PM  Result Value Ref Range   hCG, Beta Chain, Quant, S 238 (H) <5 mIU/mL    IMAGING US Ob Less Than 14 Weeks With Ob Transvaginal  Result Date: 07/02/2019 CLINICAL DATA:  Abdominal pain during first trimester of pregnancy, LMP 05/29/2019, quantitative beta HCG 225 EXAM: OBSTETRIC <14 WK [place] AND TRANSVAGINAL OB US TECHNIQUE: Both transabdominal and transvaginal ultrasound examinations were performed for complete evaluation of the gestation as well as the maternal uterus, adnexal regions, and pelvic cul-de-sac. Transvaginal technique was performed to assess early pregnancy. COMPARISON:  None for this gestation FINDINGS: Intrauterine gestational sac: Absent Yolk sac:  N/A Embryo:  N/A Cardiac Activity: N/A Heart Rate: N/A  bpm MSD:   mm    w     d CRL:    mm    w    d                  [place] EDC: Subchorionic hemorrhage:  N/A Maternal uterus/adnexae: Uterus anteverted, normal in morphology. Unremarkable endometrial  complex 16 mm thick. No endometrial fluid or gestational sac. No uterine mass. RIGHT ovary normal size and morphology, 1.8 x 2.8 x 2.3 cm. LEFT ovary normal size and morphology, 2.4 x 3.4 x 2.1 cm. Blood flow present within both ovaries on color Doppler imaging. No free pelvic fluid or adnexal masses. IMPRESSION: No intrauterine gestation identified. Findings are compatible with pregnancy of unknown location. Differential diagnosis includes early intrauterine pregnancy too early to visualize, spontaneous abortion, and ectopic pregnancy. Serial quantitative beta hCG and or followup ultrasound recommended to definitively exclude ectopic pregnancy. Electronically Signed   By: Ulyses Southward M.D.   On: 07/02/2019 14:46    MAU COURSE Orders Placed This Encounter  Procedures  . Wet prep, genital  . US OB LESS THAN 14 WEEKS WITH OB TRANSVAGINAL  . Urinalysis, Routine w reflex microscopic  . CBC  . hCG, quantitative, pregnancy  . POC Urine Pregnancy, ED (not at [facility])  . I-Stat Beta hCG blood, ED ([facility], [facility], [facility] only)  . POC urine preg, ED (not at [facility])  . Pregnancy, urine POC  . Discharge patient   Meds ordered this encounter  Medications  . cephALEXin (KEFLEX) 500  MG capsule    Sig: Take 1 capsule (500 mg total) by mouth 4 (four) times daily.    Dispense:  28 capsule    Refill:  0    Order Specific Question:   Supervising Provider    Answer:   CONSTANT, PEGGY [[id number]]    MDM +UPT UA, wet prep, GC/chlamydia, CBC, ABO/Rh, quant hCG, and US today to rule out ectopic pregnancy  U/a with positive nitrites. Will tx for UTI & send urine for culture.   Ultrasound shows no iup or adnexal mass. HCG 238. Normal pregnancy too early to visualize vs SAB vs ectopic. Will follow HCGs.   ASSESSMENT 1. Pregnancy of unknown anatomic location   2. Abdominal pain during pregnancy in first trimester   3. UTI (urinary tract infection) during pregnancy, first trimester     PLAN Discharge home in stable  condition. SAB vs ectopic precautions Rx keflex GC/CT & urine culture pending Scheduled for repeat HCG at [facility] on Friday morning  Follow-up Information    [facility] Follow up.   Specialty: Obstetrics and Gynecology Why: return for worsening symptoms Contact information: [address] [id number] [place] [phone]         Allergies as of 07/02/2019   No Known Allergies     Medication List    STOP taking these medications   sulfamethoxazole-trimethoprim 800-160 MG tablet Commonly known as: BACTRIM DS   valACYclovir 1000 MG tablet Commonly known as: VALTREX     TAKE these medications   busPIRone 15 MG tablet Commonly known as: BUSPAR Take 1 tablet (15 mg total) by mouth 2 (two) times daily.   butalbital-acetaminophen-caffeine 50-325-40 MG tablet Commonly known as: FIORICET Take 1-2 tablets by mouth every 6 (six) hours as needed for headache.   cephALEXin 500 MG capsule Commonly known as: KEFLEX Take 1 capsule (500 mg total) by mouth 4 (four) times daily.   cyclobenzaprine 10 MG tablet Commonly known as: FLEXERIL Take 1 tablet (10 mg total) by mouth 3 (three) times daily as needed for muscle spasms.   omeprazole 20 MG tablet Commonly known as: PRILOSEC OTC Take 1 tablet (20 mg total) by mouth daily.        Judeth HornLawrence, Shaolin Armas, NP 07/02/2019  3:19 PM

## 2019-07-05 ENCOUNTER — Telehealth: Payer: Self-pay | Admitting: Lactation Services

## 2019-07-05 ENCOUNTER — Encounter: Payer: Self-pay | Admitting: Lactation Services

## 2019-07-05 ENCOUNTER — Ambulatory Visit (INDEPENDENT_AMBULATORY_CARE_PROVIDER_SITE_OTHER): Payer: Medicaid Other | Admitting: Lactation Services

## 2019-07-05 ENCOUNTER — Other Ambulatory Visit: Payer: Self-pay

## 2019-07-05 DIAGNOSIS — O3680X0 Pregnancy with inconclusive fetal viability, not applicable or unspecified: Secondary | ICD-10-CM

## 2019-07-05 LAB — GC/CHLAMYDIA PROBE AMP (~~LOC~~) NOT AT ARMC
Chlamydia: NEGATIVE
Comment: NEGATIVE
Comment: NORMAL
Neisseria Gonorrhea: NEGATIVE

## 2019-07-05 LAB — BETA HCG QUANT (REF LAB): hCG Quant: 611 m[IU]/mL

## 2019-07-05 NOTE — Telephone Encounter (Signed)
Pt called back and was given results and [place] appointment time and location. Pt voiced understanding.   Pt given Eptopic precautions and informed to report to MAU for symptoms.  Pt to call with questions or concerns as needed.

## 2019-07-05 NOTE — Progress Notes (Signed)
Pt here for Beta Hcg. She is follow up from MAU.   Pt reports some abdominal pain in the center and reports is not too bad. She has not had in the last 2 days.   Pt is experiencing nausea. LMP 05/29/2019.  Pt informed she will be called in the next few hours with results and follow up care. Pt reports understanding.   HCG 61. Discussed with Dr. Hulan Fray. Plan is to get a follow up US in 1 week for viability. [place] scheduled for November 11 @ 0900. Pt needs to arrive with a full bladder at 08:45.   Pt called at 11:27 to discuss results and to inform her of follow up US scheduled. LM for pt to call the office with results and [place] appt. Will send MyChart message to pt.

## 2019-07-15 ENCOUNTER — Encounter: Payer: Self-pay | Admitting: Family Medicine

## 2019-07-16 ENCOUNTER — Ambulatory Visit: Payer: Medicaid Other | Admitting: Family Medicine

## 2019-07-17 ENCOUNTER — Ambulatory Visit (HOSPITAL_COMMUNITY)
Admission: RE | Admit: 2019-07-17 | Discharge: 2019-07-17 | Disposition: A | Discharge status: Home / Self Care | Payer: Medicaid Other | Source: Ambulatory Visit | Attending: Obstetrics & Gynecology | Admitting: Obstetrics & Gynecology

## 2019-07-17 ENCOUNTER — Other Ambulatory Visit: Payer: Self-pay

## 2019-07-17 ENCOUNTER — Encounter: Payer: Self-pay | Admitting: Family Medicine

## 2019-07-17 ENCOUNTER — Ambulatory Visit (INDEPENDENT_AMBULATORY_CARE_PROVIDER_SITE_OTHER): Payer: Medicaid Other

## 2019-07-17 DIAGNOSIS — Z3480 Encounter for supervision of other normal pregnancy, unspecified trimester: Secondary | ICD-10-CM

## 2019-07-17 DIAGNOSIS — O3680X0 Pregnancy with inconclusive fetal viability, not applicable or unspecified: Secondary | ICD-10-CM | POA: Insufficient documentation

## 2019-07-17 MED ORDER — PRENATAL PLUS 27-1 MG PO TABS: 1.0000 | ORAL_TABLET | Freq: Every day | ORAL | 11 refills | Status: DC

## 2019-07-17 MED ORDER — PROMETHAZINE HCL 25 MG PO TABS: 25.0000 mg | ORAL_TABLET | Freq: Four times a day (QID) | ORAL | 0 refills | Status: DC | PRN

## 2019-07-17 MED FILL — PRENATAL FE 27-1 MG TAB: 27-1 | 30 days supply | Qty: 30 | Fill #0

## 2019-07-17 MED FILL — PROMETHAZINE 25 MG TABLET: 25 | 7 days supply | Qty: 30 | Fill #0

## 2019-07-17 NOTE — Progress Notes (Signed)
Patient seen and assessed by nursing staff during this encounter. I have reviewed the chart and agree with the documentation and plan.  Kerry Hough, PA-C 07/17/2019 1:08 PM

## 2019-07-17 NOTE — Progress Notes (Signed)
Pt here following viability [place]. Results reviewed with Kerry Hough, PA who finds single living IUP and reccomends pt begin prenatal care. Pt is 5w 6d today with EDD 03/12/20. Results and dating reviewed with pt. Reviewed medications and allergies with patient. List of medications safe to take during pregnancy given. Pt would like to receive prenatal care in our office. Pt requesting med for nausea; phenergan rx sent to pharmacy per protocol. Prenatal rx also sent to pharmacy.   [place] office notified to provide proof of pregnancy letter and to schedule new ob appts.   Apolonio Schneiders RN 07/17/19

## 2019-08-12 ENCOUNTER — Other Ambulatory Visit: Payer: Self-pay | Admitting: Medical

## 2019-08-12 DIAGNOSIS — Z3480 Encounter for supervision of other normal pregnancy, unspecified trimester: Secondary | ICD-10-CM

## 2019-08-12 MED FILL — PRENATAL FE 27-1 MG TAB: 27-1 | 30 days supply | Qty: 30 | Fill #1

## 2019-08-12 MED FILL — PROMETHAZINE 25 MG TABLET: 25 | 7 days supply | Qty: 30 | Fill #0

## 2019-08-15 MED FILL — BUTALB-ACETAMIN-CAFF 50-325: 50-325-40 | 4 days supply | Qty: 30 | Fill #3

## 2019-08-27 ENCOUNTER — Encounter: Payer: Self-pay | Admitting: *Deleted

## 2019-08-27 ENCOUNTER — Ambulatory Visit (INDEPENDENT_AMBULATORY_CARE_PROVIDER_SITE_OTHER): Payer: Medicaid Other | Admitting: Obstetrics & Gynecology

## 2019-08-27 ENCOUNTER — Other Ambulatory Visit (HOSPITAL_COMMUNITY)
Admission: RE | Admit: 2019-08-27 | Discharge: 2019-08-27 | Disposition: A | Discharge status: Home / Self Care | Payer: Medicaid Other | Source: Ambulatory Visit | Attending: Obstetrics & Gynecology | Admitting: Obstetrics & Gynecology

## 2019-08-27 ENCOUNTER — Encounter: Payer: Self-pay | Admitting: Obstetrics & Gynecology

## 2019-08-27 ENCOUNTER — Other Ambulatory Visit: Payer: Self-pay

## 2019-08-27 VITALS — BP 123/69 | HR 69 | Wt 254.7 lb

## 2019-08-27 DIAGNOSIS — Z23 Encounter for immunization: Secondary | ICD-10-CM

## 2019-08-27 DIAGNOSIS — O99211 Obesity complicating pregnancy, first trimester: Secondary | ICD-10-CM

## 2019-08-27 DIAGNOSIS — B009 Herpesviral infection, unspecified: Secondary | ICD-10-CM | POA: Diagnosis not present

## 2019-08-27 DIAGNOSIS — O0991 Supervision of high risk pregnancy, unspecified, first trimester: Secondary | ICD-10-CM | POA: Diagnosis not present

## 2019-08-27 DIAGNOSIS — O26899 Other specified pregnancy related conditions, unspecified trimester: Secondary | ICD-10-CM | POA: Insufficient documentation

## 2019-08-27 DIAGNOSIS — Z8759 Personal history of other complications of pregnancy, childbirth and the puerperium: Secondary | ICD-10-CM | POA: Insufficient documentation

## 2019-08-27 DIAGNOSIS — Z3A12 12 weeks gestation of pregnancy: Secondary | ICD-10-CM | POA: Diagnosis not present

## 2019-08-27 DIAGNOSIS — O99210 Obesity complicating pregnancy, unspecified trimester: Secondary | ICD-10-CM | POA: Insufficient documentation

## 2019-08-27 DIAGNOSIS — N898 Other specified noninflammatory disorders of vagina: Secondary | ICD-10-CM

## 2019-08-27 DIAGNOSIS — Z1389 Encounter for screening for other disorder: Secondary | ICD-10-CM | POA: Diagnosis not present

## 2019-08-27 DIAGNOSIS — O0990 Supervision of high risk pregnancy, unspecified, unspecified trimester: Secondary | ICD-10-CM | POA: Insufficient documentation

## 2019-08-27 DIAGNOSIS — O98511 Other viral diseases complicating pregnancy, first trimester: Secondary | ICD-10-CM

## 2019-08-27 DIAGNOSIS — O26891 Other specified pregnancy related conditions, first trimester: Secondary | ICD-10-CM

## 2019-08-27 MED ORDER — BLOOD PRESSURE KIT DEVI: 1.0000 | Freq: Once | 0 refills | Status: AC

## 2019-08-27 MED ORDER — VALACYCLOVIR HCL 500 MG PO TABS: 500.0000 mg | ORAL_TABLET | Freq: Two times a day (BID) | ORAL | 6 refills | Status: DC

## 2019-08-27 MED ORDER — ASPIRIN 81 MG PO CHEW: 81.0000 mg | CHEWABLE_TABLET | Freq: Every day | ORAL | 3 refills | Status: DC

## 2019-08-27 MED FILL — VALACYCLOVIR HCL 500 MG TAB: 500 | 15 days supply | Qty: 30 | Fill #0

## 2019-08-27 NOTE — Progress Notes (Signed)
  Subjective:    [facility] is being seen today for her first obstetrical visit.  This is not a planned pregnancy. She is at [redacted]w[redacted]d gestation. Her obstetrical history is significant for obesity and pre-eclampsia with last pregnancy. Relationship with FOB: significant other, not living together. Patient does intend to breast feed. She breast fed her first baby for 4 weeks. Pregnancy history fully reviewed.  Patient reports no complaints.  Review of Systems:   Review of Systems  Objective:     LMP 05/29/2019  Physical Exam  Exam GENERAL: Well-developed, well-nourished female in no acute distress.  HEENT: Normocephalic, atraumatic. Sclerae anicteric.  NECK: Supple. Normal thyroid.  LUNGS: Clear to auscultation bilaterally.  HEART: Regular rate and rhythm. BREASTS: Symmetric with everted nipples. No masses, skin changes, nipple drainage, or lymphadenopathy. ABDOMEN: Soft, nontender, nondistended. No organomegaly. PELVIC: Normal external female genitalia. Vagina is pink and rugated.  Normal discharge. Normal cervix contour. Pap smear obtained.  Cervix somewhat friable, frothy discharge.  EXTREMITIES: No cyanosis, clubbing, or edema, 2+ distal pulses.    Assessment:    Pregnancy: G2P1001 H/o pre eclampsia Obesity      Plan:     Initial labs drawn. Prenatal vitamins. Problem list reviewed and updated. NIPS desired Role of ultrasound in pregnancy discussed; fetal survey: ordered Rec less than 20 pound weight gain Enrolled in Bab[employer] Virtual visit in 4 weeks Pap smear done today (had LGSIL in 2019) I explained that she will need to take daily valtrex starting at 36 weeks or prn sooner.I sent a prescription in. Wet prep sent  Emily Filbert 08/27/2019

## 2019-08-28 LAB — CYTOLOGY - PAP
Chlamydia: NEGATIVE
Comment: NEGATIVE
Comment: NORMAL
Diagnosis: NEGATIVE
Neisseria Gonorrhea: NEGATIVE

## 2019-08-28 LAB — HEMOGLOBIN A1C
Est. average glucose Bld gHb Est-mCnc: 91 mg/dL
Hgb A1c MFr Bld: 4.8 % (ref 4.8–5.6)

## 2019-08-28 LAB — OBSTETRIC PANEL, INCLUDING HIV
Antibody Screen: NEGATIVE
Basophils Absolute: 0.1 10*3/uL (ref 0.0–0.2)
Basos: 1 %
EOS (ABSOLUTE): 0.2 10*3/uL (ref 0.0–0.4)
Eos: 2 %
HIV Screen 4th Generation wRfx: NONREACTIVE
Hematocrit: 36 % (ref 34.0–46.6)
Hemoglobin: 12.3 g/dL (ref 11.1–15.9)
Hepatitis B Surface Ag: NEGATIVE
Immature Grans (Abs): 0 10*3/uL (ref 0.0–0.1)
Immature Granulocytes: 0 %
Lymphocytes Absolute: 2.3 10*3/uL (ref 0.7–3.1)
Lymphs: 25 %
MCH: 32.9 pg (ref 26.6–33.0)
MCHC: 34.2 g/dL (ref 31.5–35.7)
MCV: 96 fL (ref 79–97)
Monocytes Absolute: 0.4 10*3/uL (ref 0.1–0.9)
Monocytes: 5 %
Neutrophils Absolute: 6.3 10*3/uL (ref 1.4–7.0)
Neutrophils: 67 %
Platelets: 240 10*3/uL (ref 150–450)
RBC: 3.74 x10E6/uL — ABNORMAL LOW (ref 3.77–5.28)
RDW: 11.9 % (ref 11.7–15.4)
RPR Ser Ql: NONREACTIVE
Rh Factor: POSITIVE
Rubella Antibodies, IGG: 4.2 index (ref >0.99)
WBC: 9.3 10*3/uL (ref 3.4–10.8)

## 2019-08-28 LAB — CERVICOVAGINAL ANCILLARY ONLY
Bacterial Vaginitis (gardnerella): NEGATIVE
Candida Glabrata: NEGATIVE
Candida Vaginitis: NEGATIVE
Comment: NEGATIVE
Comment: NEGATIVE
Comment: NEGATIVE
Comment: NEGATIVE
Trichomonas: NEGATIVE

## 2019-08-29 LAB — URINE CULTURE, OB REFLEX

## 2019-08-29 LAB — CULTURE, OB URINE

## 2019-08-30 ENCOUNTER — Emergency Department (HOSPITAL_COMMUNITY)
Admission: EM | Admit: 2019-08-30 | Discharge: 2019-08-30 | Disposition: A | Discharge status: Home / Self Care | Payer: Medicaid Other | Attending: Emergency Medicine | Admitting: Emergency Medicine

## 2019-08-30 ENCOUNTER — Other Ambulatory Visit: Payer: Self-pay

## 2019-08-30 ENCOUNTER — Encounter (HOSPITAL_COMMUNITY): Payer: Self-pay | Admitting: Emergency Medicine

## 2019-08-30 ENCOUNTER — Emergency Department (HOSPITAL_COMMUNITY): Payer: Medicaid Other

## 2019-08-30 DIAGNOSIS — Z3491 Encounter for supervision of normal pregnancy, unspecified, first trimester: Secondary | ICD-10-CM

## 2019-08-30 DIAGNOSIS — R0781 Pleurodynia: Secondary | ICD-10-CM | POA: Diagnosis not present

## 2019-08-30 DIAGNOSIS — R079 Chest pain, unspecified: Secondary | ICD-10-CM

## 2019-08-30 DIAGNOSIS — Z79899 Other long term (current) drug therapy: Secondary | ICD-10-CM | POA: Diagnosis not present

## 2019-08-30 DIAGNOSIS — Z7982 Long term (current) use of aspirin: Secondary | ICD-10-CM | POA: Diagnosis not present

## 2019-08-30 DIAGNOSIS — O26899 Other specified pregnancy related conditions, unspecified trimester: Secondary | ICD-10-CM | POA: Insufficient documentation

## 2019-08-30 LAB — BASIC METABOLIC PANEL
Anion gap: 11 (ref 5–15)
BUN: 12 mg/dL (ref 6–20)
CO2: 21 mmol/L — ABNORMAL LOW (ref 22–32)
Calcium: 9.8 mg/dL (ref 8.9–10.3)
Chloride: 105 mmol/L (ref 98–111)
Creatinine, Ser: 0.61 mg/dL (ref 0.44–1.00)
GFR calc Af Amer: >60 mL/min (ref >60)
GFR calc non Af Amer: >60 mL/min (ref >60)
Glucose, Bld: 100 mg/dL — ABNORMAL HIGH (ref 70–99)
Potassium: 3.9 mmol/L (ref 3.5–5.1)
Sodium: 137 mmol/L (ref 135–145)

## 2019-08-30 LAB — CBC
HCT: 37.5 % (ref 36.0–46.0)
Hemoglobin: 12.4 g/dL (ref 12.0–15.0)
MCH: 32.8 pg (ref 26.0–34.0)
MCHC: 33.1 g/dL (ref 30.0–36.0)
MCV: 99.2 fL (ref 80.0–100.0)
Platelets: 240 10*3/uL (ref 150–400)
RBC: 3.78 MIL/uL — ABNORMAL LOW (ref 3.87–5.11)
RDW: 12.6 % (ref 11.5–15.5)
WBC: 8.3 10*3/uL (ref 4.0–10.5)
nRBC: 0 % (ref 0.0–0.2)

## 2019-08-30 LAB — D-DIMER, QUANTITATIVE: D-Dimer, Quant: 0.42 ug/mL-FEU (ref 0.00–0.50)

## 2019-08-30 LAB — I-STAT BETA HCG BLOOD, ED (MC, WL, AP ONLY): I-stat hCG, quantitative: >2000 m[IU]/mL — ABNORMAL HIGH (ref <5)

## 2019-08-30 LAB — TROPONIN I (HIGH SENSITIVITY)
Troponin I (High Sensitivity): 8 ng/L (ref <18)
Troponin I (High Sensitivity): <2 ng/L (ref <18)

## 2019-08-30 MED ORDER — SODIUM CHLORIDE 0.9% FLUSH: 3.0000 mL | Freq: Once | INTRAVENOUS | Status: DC

## 2019-08-30 MED ORDER — ACETAMINOPHEN 325 MG PO TABS
650.0000 mg | ORAL_TABLET | Freq: Once | ORAL | Status: AC
  Administered 2019-08-30: 650 mg via ORAL
  Filled 2019-08-30: qty 2

## 2019-08-30 NOTE — Discharge Instructions (Addendum)
Your testing here did not show any serious conditions leading to your chest pain.  You may take acetaminophen for pain, and you may use local measures such as ice packs.  Follow-up with your gynecologist.  Return if new symptoms occur.

## 2019-08-30 NOTE — ED Provider Notes (Signed)
[facility] EMERGENCY DEPARTMENT Provider Note   CSN: [id number] Arrival date & time: 08/30/19  0127   History Chief Complaint  Patient presents with  . Chest Pain    Judith Terry is a 23 y.o. female.  The history is provided by the patient.  Chest Pain She has history of GERD and is currently pregnant at [redacted] weeks gestation and comes in with sharp left-sided chest pain which started this evening.  Pain is worse with a deep breath.  She rates pain at 6/10.  There is no associated dyspnea, cough, fever, chills, nausea, vomiting, diaphoresis.  She has not taken anything for pain.  Pregnancy has been uncomplicated to this point.  She is gravida 2, para 1.  First pregnancy was complicated by preeclampsia.  She is a non-smoker and with no history of hypertension or diabetes or hyperlipidemia.  Past Medical History:  Diagnosis Date  . Anxiety   . Chronic back pain   . GERD (gastroesophageal reflux disease)   . Headache   . Herpes   . Infection    UTI  . Muscle spasm   . Pregnancy induced hypertension   . Trichomoniasis     Patient Active Problem List   Diagnosis Date Noted  . Supervision of high risk pregnancy, antepartum 08/27/2019  . History of pre-eclampsia 08/27/2019  . Obesity in pregnancy 08/27/2019  . Tension headache 03/15/2019  . Muscle spasm 03/15/2019  . Herpes 03/15/2019  . GERD (gastroesophageal reflux disease) 11/25/2014    Past Surgical History:  Procedure Laterality Date  . NO PAST SURGERIES       OB History    Gravida  2   Para  1   Term  1   Preterm      AB      Living  1     SAB      TAB      Ectopic      Multiple  0   Live Births  1           Family History  Problem Relation Age of Onset  . Diabetes Mother   . Diabetes Father   . Heart disease Father   . Kidney failure Father     Social History   Tobacco Use  . Smoking status: Never Smoker  . Smokeless tobacco: Never Used  Substance Use  Topics  . Alcohol use: No  . Drug use: No    Home Medications Prior to Admission medications   Medication Sig Start Date End Date Taking? Authorizing Provider  acetaminophen (TYLENOL) 500 MG tablet Take 500 mg by mouth every 6 (six) hours as needed.    [provider]  aspirin 81 MG chewable tablet Chew 1 tablet (81 mg total) by mouth daily. 08/27/19   Allie Bossier, MD  busPIRone (BUSPAR) 15 MG tablet Take 1 tablet (15 mg total) by mouth 2 (two) times daily. 11/13/18   Kallie Locks, FNP  calcium carbonate (TUMS - DOSED IN MG ELEMENTAL CALCIUM) 500 MG chewable tablet Chew 1 tablet by mouth daily.    [provider]  cyclobenzaprine (FLEXERIL) 10 MG tablet Take 1 tablet (10 mg total) by mouth 3 (three) times daily as needed for muscle spasms. 03/15/19   Kallie Locks, FNP  omeprazole (PRILOSEC OTC) 20 MG tablet Take 1 tablet (20 mg total) by mouth daily. 11/13/18   Kallie Locks, FNP  prenatal vitamin w/FE, FA (PRENATAL 1 + 1) 27-1 MG  TABS tablet Take 1 tablet by mouth daily at 12 noon. 07/17/19   Luvenia Redden, PA-C  promethazine (PHENERGAN) 25 MG tablet TAKE 1 TABLET (25 MG TOTAL) BY MOUTH EVERY 6 (SIX) HOURS AS NEEDED FOR NAUSEA OR VOMITING. 08/12/19   Luvenia Redden, PA-C  valACYclovir (VALTREX) 500 MG tablet Take 1 tablet (500 mg total) by mouth 2 (two) times daily. 08/27/19   Emily Filbert, MD    Allergies    Patient has no known allergies.  Review of Systems   Review of Systems  Cardiovascular: Positive for chest pain.  All other systems reviewed and are negative.   Physical Exam Updated Vital Signs BP 133/88 (BP Location: Left Arm)   Pulse 97   Temp 98.1 F (36.7 C) (Oral)   Resp 18   LMP 05/29/2019   SpO2 100%   Physical Exam Vitals and nursing note reviewed.    23 year old female, resting comfortably and in no acute distress. Vital signs are normal. Oxygen saturation is 100%, which is normal. Head is normocephalic and atraumatic. PERRLA,  EOMI. Oropharynx is clear. Neck is nontender and supple without adenopathy or JVD. Back is nontender and there is no CVA tenderness. Lungs are clear without rales, wheezes, or rhonchi. Chest is mildly tender in the left parasternal area which does reproduce her pain. Heart has regular rate and rhythm without murmur. Abdomen is soft, flat, nontender without masses or hepatosplenomegaly and peristalsis is normoactive. Extremities have no cyanosis or edema, full range of motion is present. Skin is warm and dry without rash. Neurologic: Mental status is normal, cranial nerves are intact, there are no motor or sensory deficits.  ED Results / Procedures / Treatments   Labs (all labs ordered are listed, but only abnormal results are displayed) Labs Reviewed  BASIC METABOLIC PANEL - Abnormal; Notable for the following components:      Result Value   CO2 21 (*)    Glucose, Bld 100 (*)    All other components within normal limits  CBC - Abnormal; Notable for the following components:   RBC 3.78 (*)    All other components within normal limits  I-STAT BETA HCG BLOOD, ED ([facility], [facility], [facility] ONLY) - Abnormal; Notable for the following components:   I-stat hCG, quantitative >2,000.0 (*)    All other components within normal limits  D-DIMER, QUANTITATIVE (NOT AT [facility])  TROPONIN I (HIGH SENSITIVITY)  TROPONIN I (HIGH SENSITIVITY)    EKG EKG Interpretation  Date/Time:  Friday August 30 2019 01:33:25 EST Ventricular Rate:  83 PR Interval:  140 QRS Duration: 90 QT Interval:  334 QTC Calculation: 392 R Axis:   24 Text Interpretation: Normal sinus rhythm with sinus arrhythmia Minimal voltage criteria for LVH, may be normal variant ( R in aVL ) Borderline ECG No old tracing to compare Confirmed by Delora Fuel ([id number]) on 08/30/2019 2:05:01 AM   Radiology DG Chest 1 View  Result Date: 08/30/2019 CLINICAL DATA:  Chest pain and shortness of breath EXAM: CHEST  1 VIEW COMPARISON:  02/06/2009  FINDINGS: The heart size and mediastinal contours are within normal limits. Both lungs are clear. The visualized skeletal structures are unremarkable. IMPRESSION: No active disease. Electronically Signed   By: Ulyses Jarred M.D.   On: 08/30/2019 02:14    Procedures Procedures   Medications Ordered in ED Medications  sodium chloride flush (NS) 0.9 % injection 3 mL (has no administration in time range)    ED Course  I  have reviewed the triage vital signs and the nursing notes.  Pertinent labs & imaging results that were available during my care of the patient were reviewed by me and considered in my medical decision making (see chart for details).  Chest pain which is most likely musculoskeletal.  ECG shows no acute changes, chest x-ray is normal.  CBC and metabolic panel are normal and troponin is normal.  Old records are reviewed confirming that she is currently getting prenatal care.  Will check D-dimer to rule out pulmonary embolism.  D-dimer is normal.  Cause of pain is not clear, but no evidence of serious pathology.  She is discharged with instructions to apply ice as needed, take acetaminophen as needed for pain.  Return precautions discussed.  MDM Rules/Calculators/A&P  Final Clinical Impression(s) / ED Diagnoses Final diagnoses:  Pleuritic chest pain  First trimester pregnancy    Rx / DC Orders ED Discharge Orders    None       Dione BoozeGlick, Tomislav Micale, MD 08/30/19 [phone]

## 2019-08-30 NOTE — ED Triage Notes (Addendum)
Patient reports central chest pain onset this evening with SOB , denies emesis or diaphoresis , no fever or cough. Patient is 3 months pregnant.

## 2019-09-06 NOTE — L&D Delivery Note (Addendum)
OB/GYN Faculty Practice Delivery Note  Judith Terry is a 24 y.o. G2P1001 s/p SVD at [redacted]w[redacted]d. She was admitted for IOL for gHTN.   ROM: 3h 47m with clear fluid, spontaneous. GBS Status: Positive, received penicillin Maximum Maternal Temperature: 98.6 F  Labor Progress: . Given 1 dose of Cytotec for cervical ripening.  Started on pitocin.  Had SROM.  Progressed to complete without complication.  Delivery Date/Time: 02/22/2020 at 1115 Delivery: Called to room and patient was complete and pushing. Head delivered LOA. Tight nuchal cord x2 present and delivered through. Shoulder and body delivered in usual fashion. Infant with spontaneous cry, placed on mother's abdomen, dried and stimulated. Cord clamped x 2 after 1-minute delay, and cut by patient under my direct supervision. Cord blood drawn. Placenta delivered spontaneously with gentle cord traction. Fundus firm with massage and Pitocin. Labia, perineum, vagina, and cervix were inspected, found to be intact.   Placenta: Intact, 3 vessel cord Complications: None Lacerations: None EBL: 55 cc Analgesia: IV pain meds  Infant: Viable female  APGARs 9/9  per medical record   EMILY Genene Churn, MD PGY-2 Resident, Family Medicine 02/22/2020, 11:36 AM  OB FELLOW DELIVERY ATTESTATION  I was gloved and present for the delivery in its entirety, and I agree with the above resident's note.    Jerilynn Birkenhead, MD [facility] Family Medicine Fellow, [facility] for [employer], [facility]

## 2019-09-09 MED FILL — PRENATAL FE 27-1 MG TAB: 27-1 | 30 days supply | Qty: 30 | Fill #2

## 2019-09-24 ENCOUNTER — Other Ambulatory Visit: Payer: Self-pay

## 2019-09-24 ENCOUNTER — Encounter: Payer: Self-pay | Admitting: Advanced Practice Midwife

## 2019-09-24 ENCOUNTER — Telehealth (INDEPENDENT_AMBULATORY_CARE_PROVIDER_SITE_OTHER): Payer: Medicaid Other | Admitting: Advanced Practice Midwife

## 2019-09-24 DIAGNOSIS — O0992 Supervision of high risk pregnancy, unspecified, second trimester: Secondary | ICD-10-CM

## 2019-09-24 DIAGNOSIS — Z3A16 16 weeks gestation of pregnancy: Secondary | ICD-10-CM | POA: Diagnosis not present

## 2019-09-24 DIAGNOSIS — O0990 Supervision of high risk pregnancy, unspecified, unspecified trimester: Secondary | ICD-10-CM

## 2019-09-24 NOTE — Progress Notes (Signed)
   TELEHEALTH VIRTUAL OBSTETRICS VISIT ENCOUNTER NOTE  I connected with [facility] on 09/24/19 at  9:15 AM EST by telephone at home and verified that I am speaking with the correct person using two identifiers.   I discussed the limitations, risks, security and privacy concerns of performing an evaluation and management service by telephone and the availability of in person appointments. I also discussed with the patient that there may be a patient responsible charge related to this service. The patient expressed understanding and agreed to proceed.  Subjective:  May Manrique is a 24 y.o. G2P1001 at [redacted]w[redacted]d being followed for ongoing prenatal care.  She is currently monitored for the following issues for this low-risk pregnancy and has GERD (gastroesophageal reflux disease); Tension headache; Muscle spasm; Herpes; Supervision of high risk pregnancy, antepartum; History of pre-eclampsia; and Obesity in pregnancy on their problem list.  Patient reports no complaints. Reports fetal movement. Denies any contractions, bleeding or leaking of fluid.   The following portions of the patient's history were reviewed and updated as appropriate: allergies, current medications, past family history, past medical history, past social history, past surgical history and problem list.   Objective:   General:  Alert, oriented and cooperative.   Mental Status: Normal mood and affect perceived. Normal judgment and thought content.  Rest of physical exam deferred due to type of encounter  Assessment and Plan:  Pregnancy: G2P1001 at [redacted]w[redacted]d 1. Supervision of high risk pregnancy, antepartum - routine care  Preterm labor symptoms and general obstetric precautions including but not limited to vaginal bleeding, contractions, leaking of fluid and fetal movement were reviewed in detail with the patient.  I discussed the assessment and treatment plan with the patient. The patient was provided an opportunity to ask  questions and all were answered. The patient agreed with the plan and demonstrated an understanding of the instructions. The patient was advised to call back or seek an in-person office evaluation/go to MAU at [facility] for any urgent or concerning symptoms. Please refer to After Visit Summary for other counseling recommendations.   I provided 12 minutes of non-face-to-face time during this encounter.  Return in about 4 weeks (around 10/22/2019) for virtual visit .  Future Appointments  Date Time Provider Department Center  10/09/2019 10:00 AM WH-MFC NURSE WH-MFC MFC-US  10/09/2019 10:00 AM WH-MFC [place] 3 WH-MFCUS MFC-US  11/13/2019  3:00 PM Kallie Locks, FNP SCC-SCC None    Thressa Sheller DNP, CNM  09/24/19  9:33 AM  Center for [employer], [facility]

## 2019-09-24 NOTE — Progress Notes (Signed)
I connected with  [facility] on 09/24/19 at  9:15 AM EST by telephone and verified that I am speaking with the correct person using two identifiers.   I discussed the limitations, risks, security and privacy concerns of performing an evaluation and management service by telephone and the availability of in person appointments. I also discussed with the patient that there may be a patient responsible charge related to this service. The patient expressed understanding and agreed to proceed.  Janene Madeira Adrie Picking, CMA 09/24/2019  9:15 AM

## 2019-09-25 ENCOUNTER — Encounter: Payer: Self-pay | Admitting: *Deleted

## 2019-10-08 ENCOUNTER — Other Ambulatory Visit: Payer: Self-pay | Admitting: *Deleted

## 2019-10-08 DIAGNOSIS — O0990 Supervision of high risk pregnancy, unspecified, unspecified trimester: Secondary | ICD-10-CM

## 2019-10-09 ENCOUNTER — Other Ambulatory Visit: Payer: Medicaid Other

## 2019-10-09 ENCOUNTER — Ambulatory Visit (HOSPITAL_COMMUNITY): Payer: Medicaid Other | Admitting: *Deleted

## 2019-10-09 ENCOUNTER — Other Ambulatory Visit (HOSPITAL_COMMUNITY): Payer: Self-pay | Admitting: *Deleted

## 2019-10-09 ENCOUNTER — Other Ambulatory Visit: Payer: Self-pay

## 2019-10-09 ENCOUNTER — Encounter (HOSPITAL_COMMUNITY): Payer: Self-pay

## 2019-10-09 ENCOUNTER — Ambulatory Visit (HOSPITAL_COMMUNITY)
Admission: RE | Admit: 2019-10-09 | Discharge: 2019-10-09 | Disposition: A | Discharge status: Home / Self Care | Payer: Medicaid Other | Source: Ambulatory Visit | Attending: Obstetrics & Gynecology | Admitting: Obstetrics & Gynecology

## 2019-10-09 DIAGNOSIS — O0990 Supervision of high risk pregnancy, unspecified, unspecified trimester: Secondary | ICD-10-CM

## 2019-10-09 DIAGNOSIS — O99212 Obesity complicating pregnancy, second trimester: Secondary | ICD-10-CM | POA: Diagnosis not present

## 2019-10-09 DIAGNOSIS — O34219 Maternal care for unspecified type scar from previous cesarean delivery: Secondary | ICD-10-CM | POA: Diagnosis not present

## 2019-10-09 DIAGNOSIS — Z3A19 19 weeks gestation of pregnancy: Secondary | ICD-10-CM | POA: Diagnosis not present

## 2019-10-09 DIAGNOSIS — Z362 Encounter for other antenatal screening follow-up: Secondary | ICD-10-CM

## 2019-10-11 LAB — AFP, SERUM, OPEN SPINA BIFIDA
AFP MoM: 0.91
AFP Value: 27.3 ng/mL
Gest. Age on Collection Date: 17.6 weeks
Maternal Age At EDD: 23.4 yr
OSBR Risk 1 IN: 10000
Test Results:: NEGATIVE
Weight: 258 [lb_av]

## 2019-10-15 ENCOUNTER — Encounter: Payer: Self-pay | Admitting: Certified Nurse Midwife

## 2019-10-15 ENCOUNTER — Other Ambulatory Visit: Payer: Self-pay

## 2019-10-15 DIAGNOSIS — Z3480 Encounter for supervision of other normal pregnancy, unspecified trimester: Secondary | ICD-10-CM

## 2019-10-15 MED ORDER — PROMETHAZINE HCL 25 MG PO TABS: 25.0000 mg | ORAL_TABLET | Freq: Four times a day (QID) | ORAL | 0 refills | Status: DC | PRN

## 2019-10-15 MED FILL — PRENATAL FE 27-1 MG TAB: 27-1 | 30 days supply | Qty: 30 | Fill #3

## 2019-10-15 MED FILL — PROMETHAZINE 25 MG TABLET: 25 | 7 days supply | Qty: 30 | Fill #0

## 2019-10-18 ENCOUNTER — Telehealth: Payer: Self-pay | Admitting: Family Medicine

## 2019-10-20 ENCOUNTER — Other Ambulatory Visit: Payer: Self-pay | Admitting: Family Medicine

## 2019-10-20 DIAGNOSIS — G44209 Tension-type headache, unspecified, not intractable: Secondary | ICD-10-CM

## 2019-10-21 ENCOUNTER — Telehealth: Payer: Self-pay

## 2019-10-21 NOTE — Telephone Encounter (Signed)
Yes. She is expecting.

## 2019-10-21 NOTE — Telephone Encounter (Signed)
Sent TO Np RMA

## 2019-10-21 NOTE — Telephone Encounter (Signed)
M, FNP  You Yesterday (8:04 AM)   Please assess patient's pregnancy status, prior to refill. Thank you.    Message text   Enid Derry, NT  Kallie Locks, FNP 3 days ago  CM Med refill Butalbital   Routing comment

## 2019-10-22 ENCOUNTER — Telehealth (INDEPENDENT_AMBULATORY_CARE_PROVIDER_SITE_OTHER): Payer: Medicaid Other | Admitting: Certified Nurse Midwife

## 2019-10-22 ENCOUNTER — Encounter: Payer: Self-pay | Admitting: Certified Nurse Midwife

## 2019-10-22 ENCOUNTER — Other Ambulatory Visit: Payer: Self-pay | Admitting: Family Medicine

## 2019-10-22 VITALS — BP 123/70 | HR 89

## 2019-10-22 DIAGNOSIS — O99210 Obesity complicating pregnancy, unspecified trimester: Secondary | ICD-10-CM

## 2019-10-22 DIAGNOSIS — B009 Herpesviral infection, unspecified: Secondary | ICD-10-CM

## 2019-10-22 DIAGNOSIS — Z8759 Personal history of other complications of pregnancy, childbirth and the puerperium: Secondary | ICD-10-CM

## 2019-10-22 DIAGNOSIS — O0990 Supervision of high risk pregnancy, unspecified, unspecified trimester: Secondary | ICD-10-CM

## 2019-10-22 DIAGNOSIS — G44209 Tension-type headache, unspecified, not intractable: Secondary | ICD-10-CM

## 2019-10-22 DIAGNOSIS — Z3A19 19 weeks gestation of pregnancy: Secondary | ICD-10-CM

## 2019-10-22 DIAGNOSIS — O98512 Other viral diseases complicating pregnancy, second trimester: Secondary | ICD-10-CM

## 2019-10-22 MED ORDER — BUTALBITAL-APAP-CAFFEINE 50-325-40 MG PO TABS: 1.0000 | ORAL_TABLET | Freq: Four times a day (QID) | ORAL | 0 refills | Status: DC | PRN

## 2019-10-22 MED FILL — BUTALB-ACETAMIN-CAFF 50-325: 50-325-40 | 4 days supply | Qty: 30 | Fill #0

## 2019-10-22 NOTE — Progress Notes (Signed)
TELEHEALTH OBSTETRICS PRENATAL VIRTUAL VIDEO VISIT ENCOUNTER NOTE  Provider location: Center for [employer] at [place] connected with [facility] on 10/22/19 at  9:15 AM EST by MyChart Video Encounter at home and verified that I am speaking with the correct person using two identifiers.   I discussed the limitations, risks, security and privacy concerns of performing an evaluation and management service virtually and the availability of in person appointments. I also discussed with the patient that there may be a patient responsible charge related to this service. The patient expressed understanding and agreed to proceed. Subjective:  Judith Terry is a 24 y.o. G2P1001 at [redacted]w[redacted]d being seen today for ongoing prenatal care.  She is currently monitored for the following issues for this high-risk pregnancy and has GERD (gastroesophageal reflux disease); Tension headache; Muscle spasm; Herpes; Supervision of high risk pregnancy, antepartum; History of pre-eclampsia; and Obesity in pregnancy on their problem list.  Patient reports headache.  Contractions: Not present. Vag. Bleeding: None.  Movement: Present. Denies any leaking of fluid.   The following portions of the patient's history were reviewed and updated as appropriate: allergies, current medications, past family history, past medical history, past social history, past surgical history and problem list.   Objective:   Vitals:   10/22/19 0904  BP: 123/70  Pulse: 89    Fetal Status:     Movement: Present     General:  Alert, oriented and cooperative. Patient is in no acute distress.  Respiratory: Normal respiratory effort, no problems with respiration noted  Mental Status: Normal mood and affect. Normal behavior. Normal judgment and thought content.  Rest of physical exam deferred due to type of encounter  Imaging: [place] MFM OB DETAIL +14 WK  Result Date:  10/09/2019 ----------------------------------------------------------------------  OBSTETRICS REPORT                       (Signed Final 10/09/2019 11:11 am) ---------------------------------------------------------------------- Patient Info  ID #:       [id number]                          D.O.B.:  06/20/1996 (23 yrs)  Name:       Judith Terry                 Visit Date: 10/09/2019 10:52 am ---------------------------------------------------------------------- Performed By  Performed By:     Felecia Jan        Ref. Address:     [address]                                                             [address]  Attending:        Tama High MD        [facility]  Referred By:      [facility] ---------------------------------------------------------------------- Orders   #  Description                          Code         Ordered By   1  [place] MFM OB DETAIL +14 WK              76811.01     MYRA DOVE  ----------------------------------------------------------------------   #  Order #                    Accession #                 Episode #   1  [id number]                  [id number]                  [id number]  ---------------------------------------------------------------------- Indications   [redacted] weeks gestation of pregnancy                Z3A.17   Encounter for antenatal screening for          Z36.3   malformations   Obesity complicating pregnancy, second         O99.212   trimester   Low risk NIPS  ---------------------------------------------------------------------- Fetal Evaluation  Num Of Fetuses:         1  Fetal Heart Rate(bpm):  149  Cardiac Activity:       Observed  Presentation:           Cephalic  Placenta:               Anterior LT  P. Cord Insertion:      Visualized, central  Amniotic Fluid  AFI FV:      Within normal limits                              Largest Pocket(cm)                               5.54 ---------------------------------------------------------------------- Biometry  BPD:      44.1  mm     G. Age:  19w 2d         96  %    CI:        78.98   %    70 - 86                                                          FL/HC:      17.5   %    15.8 - 18  HC:      156.9  mm     G. Age:  18w 4d         78  %    HC/AC:      1.22        1.07 - 1.29  AC:      128.3  mm     G. Age:  18w 3d         66  %  FL/BPD:     62.1   %  FL:       27.4  mm     G. Age:  18w 3d         64  %    FL/AC:      21.4   %    20 - 24  HUM:      27.5  mm     G. Age:  18w 6d         82  %  CER:      19.3  mm     G. Age:  18w 5d         73  %  NFT:       3.4  mm  LV:        6.7  mm  CM:        4.5  mm  Est. FW:     240  gm      0 lb 8 oz     80  % ---------------------------------------------------------------------- OB History  Gravidity:    2         Term:   1  Living:       1 ---------------------------------------------------------------------- Gestational Age  Clinical EDD:  17w 6d                                        EDD:   03/12/20  U/S Today:     18w 5d                                        EDD:   03/06/20  Best:          17w 6d     Det. By:  Clinical EDD             EDD:   03/12/20 ---------------------------------------------------------------------- Anatomy  Cranium:               Appears normal         Aortic Arch:            Appears normal  Cavum:                 Appears normal         Ductal Arch:            Appears normal  Ventricles:            Appears normal         Diaphragm:              Appears normal  Choroid Plexus:        Appears normal         Stomach:                Appears normal, left                                                                        sided  Cerebellum:  Appears normal         Abdomen:                Appears normal  Posterior Fossa:       Appears normal         Abdominal Wall:         Appears nml (cord                                                                         insert, abd wall)  Nuchal Fold:           Appears normal         Cord Vessels:           Appears normal (3                                                                        vessel cord)  Face:                  Appears normal         Kidneys:                Appear normal                         (orbits and profile)  Lips:                  Appears normal         Bladder:                Appears normal  Thoracic:              Appears normal         Spine:                  Not well visualized  Heart:                 Appears normal         Upper Extremities:      Appears normal                         (4CH, axis, and                         situs)  RVOT:                  Appears normal         Lower Extremities:      Appears normal  LVOT:                  Appears normal  Other:  Nasal bone visualized. Heels and 5th digit visualized. ---------------------------------------------------------------------- Cervix Uterus Adnexa  Cervix  Length:           4.47  cm.  Normal appearance by transabdominal scan.  Uterus  No abnormality visualized.  Left Ovary  Within normal limits.  Right Ovary  Within normal limits. ---------------------------------------------------------------------- Impression  We performed fetal anatomy scan. No makers of  aneuploidies or fetal structural defects are seen. Fetal  biometry is consistent with her previously-established dates.  Amniotic fluid is normal and good fetal activity is seen.  Patient understands the limitations of ultrasound in detecting  fetal anomalies.  On cell-free fetal DNA screening, the risks of fetal  aneuploidies are not increased.  Obstetric history is significant for 1 previous term vaginal  delivery. ---------------------------------------------------------------------- Recommendations  -An appointment was made for her to return in 4 weeks for  completion of fetal anatomy. ----------------------------------------------------------------------                   Noralee Space, MD Electronically Signed Final Report   10/09/2019 11:11 am ----------------------------------------------------------------------   Assessment and Plan:  Pregnancy: G2P1001 at [redacted]w[redacted]d 1. Supervision of high risk pregnancy, antepartum - Patient doing well, reports occasional HA  - Routine prenatal care  - Anticipatory guidance on upcoming appointments  - Reviewed recent US and AFP labs with patient, answered patient questions   2. History of pre-eclampsia - BP stable today  - Patient reports a hx of HA, which she is currently taking butalbital for HA - patient reports needing refill of Rx  - Rx for fioricet sent to pharmacy of choice     3. Obesity in pregnancy - TWG 29lb  - Educated and discussed recommendation of weight gain during pregnancy   4. Herpes - suppression around 35-36 weeks    Preterm labor symptoms and general obstetric precautions including but not limited to vaginal bleeding, contractions, leaking of fluid and fetal movement were reviewed in detail with the patient. I discussed the assessment and treatment plan with the patient. The patient was provided an opportunity to ask questions and all were answered. The patient agreed with the plan and demonstrated an understanding of the instructions. The patient was advised to call back or seek an in-person office evaluation/go to MAU at [facility] for any urgent or concerning symptoms. Please refer to After Visit Summary for other counseling recommendations.   I provided 10 minutes of face-to-face time during this encounter.  Return in about 4 weeks (around 11/19/2019) for ROB-mychart.  Future Appointments  Date Time Provider Department Center  11/06/2019 10:45 AM WH-MFC NURSE WH-MFC MFC-US  11/06/2019 10:45 AM WH-MFC [place] 2 WH-MFCUS MFC-US  11/13/2019  3:00 PM Kallie Locks, FNP SCC-SCC None  11/19/2019  9:15 AM Nugent, Odie Sera, NP WOC-WOCA WOC    Sharyon Cable, CNM Center for [employer], [facility]

## 2019-10-22 NOTE — Progress Notes (Signed)
I connected with  [facility] on 10/22/19 at  9:15 AM EST by telephone and verified that I am speaking with the correct person using two identifiers.   I discussed the limitations, risks, security and privacy concerns of performing an evaluation and management service by telephone and the availability of in person appointments. I also discussed with the patient that there may be a patient responsible charge related to this service. The patient expressed understanding and agreed to proceed.  Henrietta Dine, CMA 10/22/2019  8:59 AM

## 2019-10-25 ENCOUNTER — Encounter: Payer: Self-pay | Admitting: Certified Nurse Midwife

## 2019-10-28 ENCOUNTER — Encounter: Payer: Self-pay | Admitting: Certified Nurse Midwife

## 2019-11-04 DIAGNOSIS — Z3A24 24 weeks gestation of pregnancy: Secondary | ICD-10-CM

## 2019-11-04 HISTORY — DX: 24 weeks gestation of pregnancy: Z3A.24

## 2019-11-06 ENCOUNTER — Ambulatory Visit (HOSPITAL_COMMUNITY)
Admission: RE | Admit: 2019-11-06 | Discharge: 2019-11-06 | Disposition: A | Discharge status: Home / Self Care | Payer: Medicaid Other | Source: Ambulatory Visit | Attending: Obstetrics and Gynecology | Admitting: Obstetrics and Gynecology

## 2019-11-06 ENCOUNTER — Encounter (HOSPITAL_COMMUNITY): Payer: Self-pay

## 2019-11-06 ENCOUNTER — Other Ambulatory Visit: Payer: Self-pay

## 2019-11-06 ENCOUNTER — Ambulatory Visit (HOSPITAL_COMMUNITY): Payer: Medicaid Other | Admitting: *Deleted

## 2019-11-06 DIAGNOSIS — Z3A21 21 weeks gestation of pregnancy: Secondary | ICD-10-CM

## 2019-11-06 DIAGNOSIS — O0990 Supervision of high risk pregnancy, unspecified, unspecified trimester: Secondary | ICD-10-CM | POA: Diagnosis present

## 2019-11-06 DIAGNOSIS — O99212 Obesity complicating pregnancy, second trimester: Secondary | ICD-10-CM | POA: Diagnosis not present

## 2019-11-06 DIAGNOSIS — O09292 Supervision of pregnancy with other poor reproductive or obstetric history, second trimester: Secondary | ICD-10-CM | POA: Diagnosis not present

## 2019-11-06 DIAGNOSIS — Z362 Encounter for other antenatal screening follow-up: Secondary | ICD-10-CM | POA: Insufficient documentation

## 2019-11-07 ENCOUNTER — Encounter: Payer: Self-pay | Admitting: Certified Nurse Midwife

## 2019-11-07 ENCOUNTER — Other Ambulatory Visit: Payer: Self-pay | Admitting: Student

## 2019-11-07 DIAGNOSIS — Z3480 Encounter for supervision of other normal pregnancy, unspecified trimester: Secondary | ICD-10-CM

## 2019-11-08 MED ORDER — PROMETHAZINE HCL 25 MG PO TABS: 25.0000 mg | ORAL_TABLET | Freq: Four times a day (QID) | ORAL | 0 refills | Status: DC | PRN

## 2019-11-08 MED FILL — PROMETHAZINE 25 MG TABLET: 25 | 7 days supply | Qty: 30 | Fill #0

## 2019-11-09 ENCOUNTER — Other Ambulatory Visit: Payer: Self-pay | Admitting: Certified Nurse Midwife

## 2019-11-09 DIAGNOSIS — Z3480 Encounter for supervision of other normal pregnancy, unspecified trimester: Secondary | ICD-10-CM

## 2019-11-09 DIAGNOSIS — B009 Herpesviral infection, unspecified: Secondary | ICD-10-CM

## 2019-11-09 MED ORDER — VALACYCLOVIR HCL 500 MG PO TABS: 500.0000 mg | ORAL_TABLET | Freq: Two times a day (BID) | ORAL | 2 refills | Status: DC

## 2019-11-09 MED ORDER — BUTALBITAL-APAP-CAFFEINE 50-325-40 MG PO CAPS: 1.0000 | ORAL_CAPSULE | Freq: Four times a day (QID) | ORAL | 0 refills | Status: DC | PRN

## 2019-11-09 MED ORDER — PRENATAL PLUS 27-1 MG PO TABS: 1.0000 | ORAL_TABLET | Freq: Every day | ORAL | 11 refills | Status: DC

## 2019-11-11 ENCOUNTER — Encounter: Payer: Self-pay | Admitting: Certified Nurse Midwife

## 2019-11-11 MED FILL — BUTALB-ACETAMIN-CAFF 50-325: 50-325-40 | 30 days supply | Qty: 30 | Fill #0

## 2019-11-11 MED FILL — VALACYCLOVIR HCL 500 MG TAB: 500 | 30 days supply | Qty: 30 | Fill #0

## 2019-11-12 ENCOUNTER — Telehealth: Payer: Self-pay | Admitting: Family Medicine

## 2019-11-12 NOTE — Telephone Encounter (Signed)
Pt was called and reminded of there appointment 

## 2019-11-13 ENCOUNTER — Other Ambulatory Visit: Payer: Self-pay

## 2019-11-13 ENCOUNTER — Ambulatory Visit (INDEPENDENT_AMBULATORY_CARE_PROVIDER_SITE_OTHER): Payer: Medicaid Other | Admitting: Family Medicine

## 2019-11-13 ENCOUNTER — Encounter: Payer: Self-pay | Admitting: Family Medicine

## 2019-11-13 VITALS — BP 119/70 | HR 92 | Temp 98.0°F | Ht 69.0 in | Wt 266.2 lb

## 2019-11-13 DIAGNOSIS — Z0000 Encounter for general adult medical examination without abnormal findings: Secondary | ICD-10-CM | POA: Diagnosis not present

## 2019-11-13 DIAGNOSIS — R519 Headache, unspecified: Secondary | ICD-10-CM

## 2019-11-13 DIAGNOSIS — Z09 Encounter for follow-up examination after completed treatment for conditions other than malignant neoplasm: Secondary | ICD-10-CM

## 2019-11-13 DIAGNOSIS — Z8759 Personal history of other complications of pregnancy, childbirth and the puerperium: Secondary | ICD-10-CM | POA: Diagnosis not present

## 2019-11-13 DIAGNOSIS — Z3A24 24 weeks gestation of pregnancy: Secondary | ICD-10-CM | POA: Diagnosis not present

## 2019-11-13 LAB — POCT URINALYSIS DIPSTICK
Blood, UA: NEGATIVE
Glucose, UA: NEGATIVE
Nitrite, UA: NEGATIVE
Protein, UA: POSITIVE — AB
Spec Grav, UA: >=1.03 — AB (ref 1.010–1.025)
Urobilinogen, UA: 0.2 E.U./dL
pH, UA: 6 (ref 5.0–8.0)

## 2019-11-13 LAB — POCT GLYCOSYLATED HEMOGLOBIN (HGB A1C): Hemoglobin A1C: 5 % (ref 4.0–5.6)

## 2019-11-13 LAB — GLUCOSE, POCT (MANUAL RESULT ENTRY): POC Glucose: 77 mg/dl (ref 70–99)

## 2019-11-13 NOTE — Progress Notes (Signed)
[facility]   Established Patient Office Visit  Subjective:  Patient ID: Judith Terry, female    DOB: 1995-12-28  Age: 24 y.o. MRN: [id number]  CC:  Chief Complaint  Patient presents with  . Follow-up    HPI Judith Terry is a 24 year old female who presents for Follow Up today.   Past Medical History:  Diagnosis Date  . Anxiety   . Chronic back pain   . GERD (gastroesophageal reflux disease)   . Headache   . Herpes   . Infection    UTI  . Muscle spasm   . Pregnancy induced hypertension   . Trichomoniasis     Current Status: Since her last office visit, she is doing well with no complaints. She continues to have chronic headaches. She is taking Acetaminophen at this time. She is now [redacted] weeks gestation and continues to follow up with OBGYN as needed. Her anxiety is mild today. She denies suicidal ideations, homicidal ideations, or auditory hallucinations. She denies fevers, chills, fatigue, recent infections, weight loss, and night sweats. She has not had any headaches, visual changes, dizziness, and falls. No chest pain, heart palpitations, cough and shortness of breath reported. No reports of GI problems such as nausea, vomiting, diarrhea, and constipation. She has no reports of blood in stools, dysuria and hematuria. She denies pain today.   Past Surgical History:  Procedure Laterality Date  . NO PAST SURGERIES      Family History  Problem Relation Age of Onset  . Diabetes Mother   . Diabetes Father   . Heart disease Father   . Kidney failure Father     Social History   Socioeconomic History  . Marital status: Single    Spouse name: Not on file  . Number of children: Not on file  . Years of education: Not on file  . Highest education level: Not on file  Occupational History  . Not on file  Tobacco Use  . Smoking status: Never Smoker  . Smokeless tobacco: Never Used  Substance and Sexual Activity  .  Alcohol use: No  . Drug use: No  . Sexual activity: Yes    Birth control/protection: None  Other Topics Concern  . Not on file  Social History Narrative  . Not on file   Social Determinants of Health   Financial Resource Strain:   . Difficulty of Paying Living Expenses:   Food Insecurity:   . Worried About Programme researcher, broadcasting/film/video in the Last Year:   . Barista in the Last Year:   Transportation Needs:   . Freight forwarder (Medical):   Marland Kitchen Lack of Transportation (Non-Medical):   Physical Activity:   . Days of Exercise per Week:   . Minutes of Exercise per Session:   Stress:   . Feeling of Stress :   Social Connections:   . Frequency of Communication with Friends and Family:   . Frequency of Social Gatherings with Friends and Family:   . Attends Religious Services:   . Active Member of Clubs or Organizations:   . Attends Banker Meetings:   Marland Kitchen Marital Status:   Intimate Partner Violence:   . Fear of Current or Ex-Partner:   . Emotionally Abused:   Marland Kitchen Physically Abused:   . Sexually Abused:     Outpatient Medications Prior to Visit  Medication Sig Dispense Refill  . acetaminophen (TYLENOL) 500 MG tablet  Take 500 mg by mouth every 6 (six) hours as needed for mild pain or fever.     Marland Kitchen aspirin 81 MG chewable tablet Chew 1 tablet (81 mg total) by mouth daily. 90 tablet 3  . busPIRone (BUSPAR) 15 MG tablet Take 1 tablet (15 mg total) by mouth 2 (two) times daily. 60 tablet 3  . Butalbital-APAP-Caffeine 50-325-40 MG capsule Take 1 capsule by mouth every 6 (six) hours as needed for headache. 30 capsule 0  . calcium carbonate (TUMS - DOSED IN MG ELEMENTAL CALCIUM) 500 MG chewable tablet Chew 1 tablet by mouth daily.    Marland Kitchen omeprazole (PRILOSEC OTC) 20 MG tablet Take 1 tablet (20 mg total) by mouth daily. 30 tablet 3  . prenatal vitamin w/FE, FA (PRENATAL 1 + 1) 27-1 MG TABS tablet Take 1 tablet by mouth daily at 12 noon. 30 tablet 11  . promethazine (PHENERGAN) 25  MG tablet Take 1 tablet (25 mg total) by mouth every 6 (six) hours as needed for nausea or vomiting. 30 tablet 0  . valACYclovir (VALTREX) 500 MG tablet Take 1 tablet (500 mg total) by mouth 2 (two) times daily. 30 tablet 2   No facility-administered medications prior to visit.    No Known Allergies  ROS Review of Systems  Constitutional: Negative.   HENT: Negative.   Eyes: Negative.   Respiratory: Negative.   Cardiovascular: Negative.   Gastrointestinal: Negative.   Endocrine: Negative.   Genitourinary: Negative.   Musculoskeletal: Negative.   Skin: Negative.   Allergic/Immunologic: Negative.   Neurological: Positive for headaches (occasional).  Hematological: Negative.   Psychiatric/Behavioral: Negative.       Objective:    Physical Exam  Constitutional: She is oriented to person, place, and time. She appears well-developed and well-nourished.  HENT:  Head: Normocephalic and atraumatic.  Eyes: Conjunctivae are normal.  Cardiovascular: Normal rate, regular rhythm, normal heart sounds and intact distal pulses.  Pulmonary/Chest: Effort normal and breath sounds normal.  Abdominal: Soft. Bowel sounds are normal. She exhibits distension (r/t pregnancy).  Musculoskeletal:        General: Normal range of motion.     Cervical back: Normal range of motion and neck supple.  Neurological: She is alert and oriented to person, place, and time. She has normal reflexes.  Skin: Skin is warm and dry.  Psychiatric: She has a normal mood and affect. Her behavior is normal. Judgment and thought content normal.  Nursing note and vitals reviewed.   BP 119/70   Pulse 92   Temp 98 F (36.7 C) (Oral)   Ht 5\' 9"  (1.753 m)   Wt 266 lb 3.2 oz (120.7 kg)   LMP 05/29/2019   SpO2 98%   BMI 39.31 kg/m  Wt Readings from Last 3 Encounters:  11/13/19 266 lb 3.2 oz (120.7 kg)  10/09/19 258 lb 9.6 oz (117.3 kg)  08/27/19 254 lb 11.2 oz (115.5 kg)     There are no preventive care reminders  to display for this patient.  There are no preventive care reminders to display for this patient.  Lab Results  Component Value Date   TSH 2.150 12/27/2017   Lab Results  Component Value Date   WBC 8.3 08/30/2019   HGB 12.4 08/30/2019   HCT 37.5 08/30/2019   MCV 99.2 08/30/2019   PLT 240 08/30/2019   Lab Results  Component Value Date   NA 137 08/30/2019   K 3.9 08/30/2019   CO2 21 (L) 08/30/2019   GLUCOSE  100 (H) 08/30/2019   BUN 12 08/30/2019   CREATININE 0.61 08/30/2019   BILITOT 0.2 12/27/2017   ALKPHOS 81 12/27/2017   AST 11 12/27/2017   ALT 11 12/27/2017   PROT 7.0 12/27/2017   ALBUMIN 4.5 12/27/2017   CALCIUM 9.8 08/30/2019   ANIONGAP 11 08/30/2019   No results found for: CHOL No results found for: HDL No results found for: LDLCALC No results found for: TRIG No results found for: CHOLHDL Lab Results  Component Value Date   HGBA1C 5.0 11/13/2019    Assessment & Plan:   1. [redacted] weeks gestation of pregnancy Pregnancy is stable. Continue to follow up with OB-GYN as needed.   2. History of pre-eclampsia Blood pressure is stable today.   3. Nonintractable headache, unspecified chronicity pattern, unspecified headache type Safety warning is given for pregnancy contraindication with Fioricet. Fioricet is discontinued at this time. She will continue Acetaminophen as needed.   4. Health care maintenance - POCT glycosylated hemoglobin (Hb A1C) - POCT glucose (manual entry) - POCT urinalysis dipstick  5. Follow up She will follow up in 6 months.   No orders of the defined types were placed in this encounter.   Orders Placed This Encounter  Procedures  . POCT glycosylated hemoglobin (Hb A1C)  . POCT glucose (manual entry)  . POCT urinalysis dipstick   Referral Orders  No referral(s) requested today    Raliegh Ip,  MSN, FNP-BC [facility] [address] [place], [place]  [address] [phone]- fax  Problem List Items Addressed This Visit      Other   History of pre-eclampsia    Other Visit Diagnoses    [redacted] weeks gestation of pregnancy    -  Primary   Nonintractable headache, unspecified chronicity pattern, unspecified headache type       Health care maintenance       Relevant Orders   POCT glycosylated hemoglobin (Hb A1C) (Completed)   POCT glucose (manual entry) (Completed)   POCT urinalysis dipstick (Completed)   Follow up          No orders of the defined types were placed in this encounter.   Follow-up: Return in about 6 months (around 05/15/2020).    Kallie Locks, FNP

## 2019-11-15 ENCOUNTER — Encounter: Payer: Self-pay | Admitting: Family Medicine

## 2019-11-15 ENCOUNTER — Encounter: Payer: Self-pay | Admitting: Certified Nurse Midwife

## 2019-11-15 DIAGNOSIS — R519 Headache, unspecified: Secondary | ICD-10-CM | POA: Insufficient documentation

## 2019-11-15 DIAGNOSIS — Z3A24 24 weeks gestation of pregnancy: Secondary | ICD-10-CM | POA: Insufficient documentation

## 2019-11-19 ENCOUNTER — Encounter: Payer: Self-pay | Admitting: Women's Health

## 2019-11-19 ENCOUNTER — Telehealth (INDEPENDENT_AMBULATORY_CARE_PROVIDER_SITE_OTHER): Payer: Medicaid Other | Admitting: Women's Health

## 2019-11-19 VITALS — BP 133/75 | HR 92

## 2019-11-19 DIAGNOSIS — Z148 Genetic carrier of other disease: Secondary | ICD-10-CM

## 2019-11-19 DIAGNOSIS — O98512 Other viral diseases complicating pregnancy, second trimester: Secondary | ICD-10-CM

## 2019-11-19 DIAGNOSIS — O99210 Obesity complicating pregnancy, unspecified trimester: Secondary | ICD-10-CM

## 2019-11-19 DIAGNOSIS — Z3A23 23 weeks gestation of pregnancy: Secondary | ICD-10-CM

## 2019-11-19 DIAGNOSIS — O09292 Supervision of pregnancy with other poor reproductive or obstetric history, second trimester: Secondary | ICD-10-CM

## 2019-11-19 DIAGNOSIS — Z8759 Personal history of other complications of pregnancy, childbirth and the puerperium: Secondary | ICD-10-CM

## 2019-11-19 DIAGNOSIS — B009 Herpesviral infection, unspecified: Secondary | ICD-10-CM

## 2019-11-19 DIAGNOSIS — O0990 Supervision of high risk pregnancy, unspecified, unspecified trimester: Secondary | ICD-10-CM

## 2019-11-19 NOTE — Patient Instructions (Addendum)
Preterm Labor and Birth Information ° °The normal length of a pregnancy is 39-41 weeks. Preterm labor is when labor starts before 37 completed weeks of pregnancy. °What are the risk factors for preterm labor? °Preterm labor is more likely to occur in women who: °· Have certain infections during pregnancy such as a bladder infection, sexually transmitted infection, or infection inside the uterus (chorioamnionitis). °· Have a shorter-than-normal cervix. °· Have gone into preterm labor before. °· Have had surgery on their cervix. °· Are younger than age 17 or older than age 35. °· Are African American. °· Are pregnant with twins or multiple babies (multiple gestation). °· Take street drugs or smoke while pregnant. °· Do not gain enough weight while pregnant. °· Became pregnant shortly after having been pregnant. °What are the symptoms of preterm labor? °Symptoms of preterm labor include: °· Cramps similar to those that can happen during a menstrual period. The cramps may happen with diarrhea. °· Pain in the abdomen or lower back. °· Regular uterine contractions that may feel like tightening of the abdomen. °· A feeling of increased pressure in the pelvis. °· Increased watery or bloody mucus discharge from the vagina. °· Water breaking (ruptured amniotic sac). °Why is it important to recognize signs of preterm labor? °It is important to recognize signs of preterm labor because babies who are born prematurely may not be fully developed. This can put them at an increased risk for: °· Long-term (chronic) heart and lung problems. °· Difficulty immediately after birth with regulating body systems, including blood sugar, body temperature, heart rate, and breathing rate. °· Bleeding in the brain. °· Cerebral palsy. °· Learning difficulties. °· Death. °These risks are highest for babies who are born before 34 weeks of pregnancy. °How is preterm labor treated? °Treatment depends on the length of your pregnancy, your condition,  and the health of your baby. It may involve: °· Having a stitch (suture) placed in your cervix to prevent your cervix from opening too early (cerclage). °· Taking or being given medicines, such as: °? Hormone medicines. These may be given early in pregnancy to help support the pregnancy. °? Medicine to stop contractions. °? Medicines to help mature the baby’s lungs. These may be prescribed if the risk of delivery is high. °? Medicines to prevent your baby from developing cerebral palsy. °If the labor happens before 34 weeks of pregnancy, you may need to stay in the hospital. °What should I do if I think I am in preterm labor? °If you think that you are going into preterm labor, call your health care provider right away. °How can I prevent preterm labor in future pregnancies? °To increase your chance of having a full-term pregnancy: °· Do not use any tobacco products, such as cigarettes, chewing tobacco, and e-cigarettes. If you need help quitting, ask your health care provider. °· Do not use street drugs or medicines that have not been prescribed to you during your pregnancy. °· Talk with your health care provider before taking any herbal supplements, even if you have been taking them regularly. °· Make sure you gain a healthy amount of weight during your pregnancy. °· Watch for infection. If you think that you might have an infection, get it checked right away. °· Make sure to tell your health care provider if you have gone into preterm labor before. °This information is not intended to replace advice given to you by your health care provider. Make sure you discuss any questions you have with your   health care provider. Document Revised: 12/14/2018 Document Reviewed: 01/13/2016 Elsevier Patient Education  2020 [employer].   Maternity Assessment Unit (MAU)  The Maternity Assessment Unit (MAU) is located at the [facility]. The address is: [address],  [place], [place], [place] [address]. Please see map below for additional directions.    The Maternity Assessment Unit is designed to help you during your pregnancy, and for up to 6 weeks after delivery, with any pregnancy- or postpartum-related emergencies, if you think you are in labor, or if your water has broken. For example, if you experience nausea and vomiting, vaginal bleeding, severe abdominal or pelvic pain, elevated blood pressure or other problems related to your pregnancy or postpartum time, please come to the Maternity Assessment Unit for assistance.    Second Trimester of Pregnancy The second trimester is from week 14 through week 27 (months 4 through 6). The second trimester is often a time when you feel your best. Your body has adjusted to being pregnant, and you begin to feel better physically. Usually, morning sickness has lessened or quit completely, you may have more energy, and you may have an increase in appetite. The second trimester is also a time when the fetus is growing rapidly. At the end of the sixth month, the fetus is about 9 inches long and weighs about 1 pounds. You will likely begin to feel the baby move (quickening) between 16 and 20 weeks of pregnancy. Body changes during your second trimester Your body continues to go through many changes during your second trimester. The changes vary from woman to woman.  Your weight will continue to increase. You will notice your lower abdomen bulging out.  You may begin to get stretch marks on your hips, abdomen, and breasts.  You may develop headaches that can be relieved by medicines. The medicines should be approved by your health care provider.  You may urinate more often because the fetus is pressing on your bladder.  You may develop or continue to have heartburn as a result of your pregnancy.  You may develop constipation because certain hormones are causing the muscles that push waste through your intestines to slow  down.  You may develop hemorrhoids or swollen, bulging veins (varicose veins).  You may have back pain. This is caused by: ? Weight gain. ? Pregnancy hormones that are relaxing the joints in your pelvis. ? A shift in weight and the muscles that support your balance.  Your breasts will continue to grow and they will continue to become tender.  Your gums may bleed and may be sensitive to brushing and flossing.  Dark spots or blotches (chloasma, mask of pregnancy) may develop on your face. This will likely fade after the baby is born.  A dark line from your belly button to the pubic area (linea nigra) may appear. This will likely fade after the baby is born.  You may have changes in your hair. These can include thickening of your hair, rapid growth, and changes in texture. Some women also have hair loss during or after pregnancy, or hair that feels dry or thin. Your hair will most likely return to normal after your baby is born. What to expect at prenatal visits During a routine prenatal visit:  You will be weighed to make sure you and the fetus are growing normally.  Your blood pressure will be taken.  Your abdomen will be measured to track your baby's growth.  The fetal heartbeat will be listened to.  Any test results from the previous visit will be discussed. Your health care provider may ask you:  How you are feeling.  If you are feeling the baby move.  If you have had any abnormal symptoms, such as leaking fluid, bleeding, severe headaches, or abdominal cramping.  If you are using any tobacco products, including cigarettes, chewing tobacco, and electronic cigarettes.  If you have any questions. Other tests that may be performed during your second trimester include:  Blood tests that check for: ? Low iron levels (anemia). ? High blood sugar that affects pregnant women (gestational diabetes) between 39 and 28 weeks. ? Rh antibodies. This is to check for a protein on red  blood cells (Rh factor).  Urine tests to check for infections, diabetes, or protein in the urine.  An ultrasound to confirm the proper growth and development of the baby.  An amniocentesis to check for possible genetic problems.  Fetal screens for spina bifida and Down syndrome.  HIV (human immunodeficiency virus) testing. Routine prenatal testing includes screening for HIV, unless you choose not to have this test. Follow these instructions at home: Medicines  Follow your health care provider's instructions regarding medicine use. Specific medicines may be either safe or unsafe to take during pregnancy.  Take a prenatal vitamin that contains at least 600 micrograms (mcg) of folic acid.  If you develop constipation, try taking a stool softener if your health care provider approves. Eating and drinking   Eat a balanced diet that includes fresh fruits and vegetables, whole grains, good sources of protein such as meat, eggs, or tofu, and low-fat dairy. Your health care provider will help you determine the amount of weight gain that is right for you.  Avoid raw meat and uncooked cheese. These carry germs that can cause birth defects in the baby.  If you have low calcium intake from food, talk to your health care provider about whether you should take a daily calcium supplement.  Limit foods that are high in fat and processed sugars, such as fried and sweet foods.  To prevent constipation: ? Drink enough fluid to keep your urine clear or pale yellow. ? Eat foods that are high in fiber, such as fresh fruits and vegetables, whole grains, and beans. Activity  Exercise only as directed by your health care provider. Most women can continue their usual exercise routine during pregnancy. Try to exercise for 30 minutes at least 5 days a week. Stop exercising if you experience uterine contractions.  Avoid heavy lifting, wear low heel shoes, and practice good posture.  A sexual relationship  may be continued unless your health care provider directs you otherwise. Relieving pain and discomfort  Wear a good support bra to prevent discomfort from breast tenderness.  Take warm sitz baths to soothe any pain or discomfort caused by hemorrhoids. Use hemorrhoid cream if your health care provider approves.  Rest with your legs elevated if you have leg cramps or low back pain.  If you develop varicose veins, wear support hose. Elevate your feet for 15 minutes, 3-4 times a day. Limit salt in your diet. Prenatal Care  Write down your questions. Take them to your prenatal visits.  Keep all your prenatal visits as told by your health care provider. This is important. Safety  Wear your seat belt at all times when driving.  Make a list of emergency phone numbers, including numbers for family, friends, the hospital, and police and  Garment/textile technologistfire departments. General instructions  Ask your health care provider for a referral to a local prenatal education class. Begin classes no later than the beginning of month 6 of your pregnancy.  Ask for help if you have counseling or nutritional needs during pregnancy. Your health care provider can offer advice or refer you to specialists for help with various needs.  Do not use hot tubs, steam rooms, or saunas.  Do not douche or use tampons or scented sanitary pads.  Do not cross your legs for long periods of time.  Avoid cat litter boxes and soil used by cats. These carry germs that can cause birth defects in the baby and possibly loss of the fetus by miscarriage or stillbirth.  Avoid all smoking, herbs, alcohol, and unprescribed drugs. Chemicals in these products can affect the formation and growth of the baby.  Do not use any products that contain nicotine or tobacco, such as cigarettes and e-cigarettes. If you need help quitting, ask your health care provider.  Visit your dentist if you have not gone yet during your pregnancy. Use a soft toothbrush to  brush your teeth and be gentle when you floss. Contact a health care provider if:  You have dizziness.  You have mild pelvic cramps, pelvic pressure, or nagging pain in the abdominal area.  You have persistent nausea, vomiting, or diarrhea.  You have a bad smelling vaginal discharge.  You have pain when you urinate. Get help right away if:  You have a fever.  You are leaking fluid from your vagina.  You have spotting or bleeding from your vagina.  You have severe abdominal cramping or pain.  You have rapid weight gain or weight loss.  You have shortness of breath with chest pain.  You notice sudden or extreme swelling of your face, hands, ankles, feet, or legs.  You have not felt your baby move in over an hour.  You have severe headaches that do not go away when you take medicine.  You have vision changes. Summary  The second trimester is from week 14 through week 27 (months 4 through 6). It is also a time when the fetus is growing rapidly.  Your body goes through many changes during pregnancy. The changes vary from woman to woman.  Avoid all smoking, herbs, alcohol, and unprescribed drugs. These chemicals affect the formation and growth your baby.  Do not use any tobacco products, such as cigarettes, chewing tobacco, and e-cigarettes. If you need help quitting, ask your health care provider.  Contact your health care provider if you have any questions. Keep all prenatal visits as told by your health care provider. This is important. This information is not intended to replace advice given to you by your health care provider. Make sure you discuss any questions you have with your health care provider. Document Revised: 12/14/2018 Document Reviewed: 09/27/2016 Elsevier Patient Education  2020 Else[employer] Inc[employer] Preeclampsia and Eclampsia Preeclampsia is a serious condition that may develop during pregnancy. This condition causes high blood pressure and increased protein in  your urine along with other symptoms, such as headaches and vision changes. These symptoms may develop as the condition gets worse. Preeclampsia may occur at 20 weeks of pregnancy or later. Diagnosing and treating preeclampsia early is very important. If not treated early, it can cause serious problems for you and your baby. One problem it can lead to is eclampsia. Eclampsia is a condition that causes muscle jerking or shaking (convulsions or seizures) and other  serious problems for the mother. During pregnancy, delivering your baby may be the best treatment for preeclampsia or eclampsia. For most women, preeclampsia and eclampsia symptoms go away after giving birth. In rare cases, a woman may develop preeclampsia after giving birth (postpartum preeclampsia). This usually occurs within 48 hours after childbirth but may occur up to 6 weeks after giving birth. What are the causes? The cause of preeclampsia is not known. What increases the risk? The following risk factors make you more likely to develop preeclampsia:  Being pregnant for the first time.  Having had preeclampsia during a past pregnancy.  Having a family history of preeclampsia.  Having high blood pressure.  Being pregnant with more than one baby.  Being 11 or older.  Being African-American.  Having kidney disease or diabetes.  Having medical conditions such as lupus or blood diseases.  Being very overweight (obese). What are the signs or symptoms? The most common symptoms are:  Severe headaches.  Vision problems, such as blurred or double vision.  Abdominal pain, especially upper abdominal pain. Other symptoms that may develop as the condition gets worse include:  Sudden weight gain.  Sudden swelling of the hands, face, legs, and feet.  Severe nausea and vomiting.  Numbness in the face, arms, legs, and feet.  Dizziness.  Urinating less than usual.  Slurred speech.  Convulsions or seizures. How is this  diagnosed? There are no screening tests for preeclampsia. Your health care provider will ask you about symptoms and check for signs of preeclampsia during your prenatal visits. You may also have tests that include:  Checking your blood pressure.  Urine tests to check for protein. Your health care provider will check for this at every prenatal visit.  Blood tests.  Monitoring your baby's heart rate.  Ultrasound. How is this treated? You and your health care provider will determine the treatment approach that is best for you. Treatment may include:  Having more frequent prenatal exams to check for signs of preeclampsia, if you have an increased risk for preeclampsia.  Medicine to lower your blood pressure.  Staying in the hospital, if your condition is severe. There, treatment will focus on controlling your blood pressure and the amount of fluids in your body (fluid retention).  Taking medicine (magnesium sulfate) to prevent seizures. This may be given as an injection or through an IV.  Taking a low-dose aspirin during your pregnancy.  Delivering your baby early. You may have your labor started with medicine (induced), or you may have a cesarean delivery. Follow these instructions at home: Eating and drinking   Drink enough fluid to keep your urine pale yellow.  Avoid caffeine. Lifestyle  Do not use any products that contain nicotine or tobacco, such as cigarettes and e-cigarettes. If you need help quitting, ask your health care provider.  Do not use alcohol or drugs.  Avoid stress as much as possible. Rest and get plenty of sleep. General instructions  Take over-the-counter and prescription medicines only as told by your health care provider.  When lying down, lie on your left side. This keeps pressure off your major blood vessels.  When sitting or lying down, raise (elevate) your feet. Try putting some pillows underneath your lower legs.  Exercise regularly. Ask your  health care provider what kinds of exercise are best for you.  Keep all follow-up and prenatal visits as told by your health care provider. This is important. How is this prevented? There is no known way of preventing  preeclampsia or eclampsia from developing. However, to lower your risk of complications and detect problems early:  Get regular prenatal care. Your health care provider may be able to diagnose and treat the condition early.  Maintain a healthy weight. Ask your health care provider for help managing weight gain during pregnancy.  Work with your health care provider to manage any long-term (chronic) health conditions you have, such as diabetes or kidney problems.  You may have tests of your blood pressure and kidney function after giving birth.  Your health care provider may have you take low-dose aspirin during your next pregnancy. Contact a health care provider if:  You have symptoms that your health care provider told you may require more treatment or monitoring, such as: ? Headaches. ? Nausea or vomiting. ? Abdominal pain. ? Dizziness. ? Light-headedness. Get help right away if:  You have severe: ? Abdominal pain. ? Headaches that do not get better. ? Dizziness. ? Vision problems. ? Confusion. ? Nausea or vomiting.  You have any of the following: ? A seizure. ? Sudden, rapid weight gain. ? Sudden swelling in your hands, ankles, or face. ? Trouble moving any part of your body. ? Numbness in any part of your body. ? Trouble speaking. ? Abnormal bleeding.  You faint. Summary  Preeclampsia is a serious condition that may develop during pregnancy.  This condition causes high blood pressure and increased protein in your urine along with other symptoms, such as headaches and vision changes.  Diagnosing and treating preeclampsia early is very important. If not treated early, it can cause serious problems for you and your baby.  Get help right away if you  have symptoms that your health care provider told you to watch for. This information is not intended to replace advice given to you by your health care provider. Make sure you discuss any questions you have with your health care provider. Document Revised: 04/24/2018 Document Reviewed: 03/28/2016 Elsevier Patient Education  2020 [employer].

## 2019-11-19 NOTE — Progress Notes (Signed)
I connected with [facility] on 11/19/19 at  9:15 AM EDT by: MyChart and verified that I am speaking with the correct person using two identifiers.  Patient is located at home and provider is located at [facility].     The purpose of this virtual visit is to provide medical care while limiting exposure to the novel coronavirus. I discussed the limitations, risks, security and privacy concerns of performing an evaluation and management service by MyChart and the availability of in person appointments. I also discussed with the patient that there may be a patient responsible charge related to this service. By engaging in this virtual visit, you consent to the provision of healthcare.  Additionally, you authorize for your insurance to be billed for the services provided during this visit.  The patient expressed understanding and agreed to proceed.  The following staff members participated in the virtual visit:  Donia Ast, NP    PRENATAL VISIT NOTE  Subjective:  Judith Terry is a 24 y.o. G2P1001 at [redacted]w[redacted]d  for phone visit for ongoing prenatal care.  She is currently monitored for the following issues for this high-risk pregnancy and has GERD (gastroesophageal reflux disease); Tension headache; Muscle spasm; Herpes; Supervision of high risk pregnancy, antepartum; History of pre-eclampsia; Obesity in pregnancy; [redacted] weeks gestation of pregnancy; and Nonintractable headache on their problem list.  Patient reports no complaints.  Contractions: Not present. Vag. Bleeding: None.  Movement: Present. Denies leaking of fluid.   The following portions of the patient's history were reviewed and updated as appropriate: allergies, current medications, past family history, past medical history, past social history, past surgical history and problem list.   Objective:   Vitals:   11/19/19 0901  BP: 133/75  Pulse: 92   Self-Obtained  Fetal Status:     Movement: Present     Assessment and Plan:    Pregnancy: G2P1001 at [redacted]w[redacted]d  1. Supervision of high risk pregnancy, antepartum  2. History of pre-eclampsia -on baby ASA daily -patient advised to take BP daily and report to clinic if 140/90 or higher -needs baseline labs next visit -reviewed s/sx of preeclampsia, information given  3. Obesity in pregnancy  4. Herpes -suppressive therapy @35 /36weeks  5. 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 I discussed the assessment and treatment plan with the patient. The patient was provided an opportunity to ask questions and all were answered. The patient agreed with the plan and demonstrated an understanding of the instructions. The patient was advised to call back or seek an in-person office evaluation/go to MAU at [facility] for any urgent or concerning symptoms.  Return in about 4 weeks (around 12/17/2019) for in-person ROB/GTT/baseline lab, needs genetic counseling appt with [facility].  Future Appointments  Date Time Provider Department Center  05/15/2020  2:20 PM 07/15/2020, FNP [facility] None     Time spent on virtual visit: 10 minutes  Kallie Locks, NP

## 2019-11-19 NOTE — Progress Notes (Signed)
I connected with  [facility] on 11/19/19 at  9:15 AM EDT by telephone and verified that I am speaking with the correct person using two identifiers.   I discussed the limitations, risks, security and privacy concerns of performing an evaluation and management service by telephone and the availability of in person appointments. I also discussed with the patient that there may be a patient responsible charge related to this service. The patient expressed understanding and agreed to proceed.  Janene Madeira Alyxis Grippi, CMA 11/19/2019  9:01 AM

## 2019-11-20 MED FILL — VALACYCLOVIR HCL 500 MG TAB: 500 | 30 days supply | Qty: 30 | Fill #0

## 2019-11-20 MED FILL — BUTALB-ACETAMIN-CAFF 50-325: 50-325-40 | 30 days supply | Qty: 30 | Fill #0

## 2019-11-20 MED FILL — PROMETHAZINE 25 MG TABLET: 25 | 7 days supply | Qty: 30 | Fill #0

## 2019-11-20 MED FILL — PRENATAL FE 27-1 MG TAB: 27-1 | 30 days supply | Qty: 30 | Fill #0

## 2019-11-21 ENCOUNTER — Encounter: Payer: Self-pay | Admitting: Certified Nurse Midwife

## 2019-11-23 ENCOUNTER — Encounter: Payer: Self-pay | Admitting: Certified Nurse Midwife

## 2019-11-26 ENCOUNTER — Ambulatory Visit (INDEPENDENT_AMBULATORY_CARE_PROVIDER_SITE_OTHER): Payer: Medicaid Other

## 2019-11-26 ENCOUNTER — Other Ambulatory Visit: Payer: Self-pay

## 2019-11-26 ENCOUNTER — Ambulatory Visit (HOSPITAL_COMMUNITY): Payer: Self-pay | Admitting: Women's Health

## 2019-11-26 ENCOUNTER — Ambulatory Visit (HOSPITAL_COMMUNITY): Payer: Medicaid Other | Attending: Women's Health | Admitting: Genetic Counselor

## 2019-11-26 DIAGNOSIS — Z148 Genetic carrier of other disease: Secondary | ICD-10-CM

## 2019-11-26 DIAGNOSIS — Z3A24 24 weeks gestation of pregnancy: Secondary | ICD-10-CM | POA: Diagnosis not present

## 2019-11-26 DIAGNOSIS — O0990 Supervision of high risk pregnancy, unspecified, unspecified trimester: Secondary | ICD-10-CM

## 2019-11-26 NOTE — Progress Notes (Signed)
11/26/2019  [facility] 06-Apr-1996 MRN: [id number] DOV: 11/26/2019  Ms. 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 Ms. Novack came to her appointment alone due to COVID-19 visitor restrictions.   Indication for genetic counseling - Carrier for spinal muscular atrophy  Prenatal history  Ms. France is a G2P1001, 24 y.o. female. Her current pregnancy has completed [redacted]w[redacted]d(Estimated Date of Delivery: 03/12/20).  Ms. SKlinknerdenied exposure to environmental toxins or chemical agents. She denied the use of alcohol, tobacco or street drugs. She reported taking prenatal vitamins, Fioricet, Promethazine, Valtrex, Buspirone, Tylenol, and aspirin. She denied significant viral illnesses, fevers, and bleeding during the course of her pregnancy. Her medical and surgical histories were noncontributory.  Family History  A three generation pedigree was drafted and reviewed. The family history is remarkable for the following:  - Ms. Dolinger has a maternal aunt with infertility. This aunt has not been able to have children. One of Ms. Vise's brothers and his partner had a miscarriage and a stillbirth. Ms. SWirsingwas not aware of a reason for these losses. Ms. SToporalso has a sister who has had two miscarriages.  The remaining family histories were reviewed and found to be noncontributory for birth defects, intellectual disability, recurrent pregnancy loss, and known genetic conditions. Ms. SCharterhad limited information about her partner's family history; thus, risk assessment is limited.  The patient's ethnicity is Caucasian. The father of the pregnancy's ethnicity is African American. Ashkenazi Jewish ancestry and consanguinity were denied. Pedigree will be scanned under Media.  Discussion  Ms. Springswas referred for genetic counseling as she was found to have1copyof the SMN1 gene on  Horizon-14 carrier screening through [place] Thismakes her a carrierfor spinal muscular atrophy (SMA). SMA is a condition caused by mutations in the SMN1gene. Typically, individuals have two copies of theSMN1gene, with one copy present on each chromosome. SMA carriers are missing one copy of theSMN1gene.  SMA is characterized by progressive muscle weakness and atrophy due to degeneration and loss of anterior horn cells (lower motor neurons) in the spinal cord and brain stem. We discussed the different types of SMA (0, I, II, and III), including differences in severity and age of onset. We also reviewed the autosomal recessive inheritance pattern of SMA.Both parents must be carriers for the condition to have a chance of having an affected child. This means thatthe current fetus only has a chance of being affected by SMA if Ms. Hickson's partner is also a carrier for the condition. Based on the carrier frequency for SMA in the African American population, Ms. Pino's partner currently has a 1 in 625chance of being a carrier for SMA. If he is found to have 2 copies of the SMN1 gene, his risk of being a carrier is reduced but not eliminated. Without knowing her partner's carrier status, the chance for the couple to have an affected child is currently 1 in 257(0.4%). If both parents are carriers of SMA, the couple would have a 1 in 4 (25%) chance of having an affected fetus.    Ms. SSebekcarrier screening was negative for the other 13 conditions screened. Thus, her risk to be a carrier for these additional conditions (listed separately in the laboratory report) has been reduced but not eliminated. This also significantly reduces her risk of having a child affected by one of these conditions. We discussed that carrier testing for SMA is recommended  for Ms. Mittleman's partner. Ms. Ney indicated that she is interested in pursuing partner carrier screening.    We discussed the recommendation that Ms.  Mcclurg inform her siblings about her SMA carrier status. Each of Ms. Melendrez's siblings have a 50% chance of being a carrier for SMA themselves. Ms. Narvaez siblings and their reproductive partners may consider carrier screening for SMA either during the preconception period or during pregnancy to refine their chance of having a child affected by SMA. I encouraged Ms. Shober to share my contact information with her siblings if they are interested in pursuing genetic counseling or SMA carrier screening.  We also reviewed that Ms. Rish had Panorama NIPS through the laboratory [facility] that was low-risk for fetal aneuploidies. We reviewed that these results showed a less than 1 in 10,000 risk for trisomies 21, 18 and 13, and monosomy X (Turner syndrome).  In addition, the risk for triploidy and sex chromosome trisomies (47,XXX and 47,XXY) was also low. Ms. Cohill elected to have cfDNA analysis for 22q11.2 deletion syndrome, which was also low risk (1 in 2900). We reviewed that while this testing identifies 94-99% of pregnancies with trisomy 63, trisomy 4, trisomy 60, sex chromosome aneuploidies, and triploidy, it is NOT diagnostic. A positive test result requires confirmation by [facility] or amniocentesis, and a negative test result does not rule out a fetal chromosome abnormality. She also understands that this testing does not identify all genetic conditions.  Ms. Sauser was also counseled regarding diagnostic testing via amniocentesis. We discussed the technical aspects of the procedure and quoted up to a 1 in 500 (0.2%) risk for spontaneous pregnancy loss or other adverse pregnancy outcomes as a result of amniocentesis. Cultured cells from an amniocentesis sample allow for the visualization of a fetal karyotype, which can detect >99% of chromosomal aberrations. Chromosomal microarray can also be performed to identify smaller deletions or duplications of fetal chromosomal material. Amniocentesis could also be  performed to assess whether the baby is affected by SMA. After careful consideration, Ms. Lux declined amniocentesis at this time. She understands that amniocentesis is available at any point after 16 weeks of pregnancy and that she may opt to undergo the procedure at a later date should she change her mind.  Ms. Konkel was interested in pursuing SMA carrier screening for her partner, Mechele Collin. She informed me that he already has a testing kit from [place]. I offered to assist in facilitating testing, either through [facility] or another laboratory depending on Mr. Carolyne Fiscal insurance coverage. If he has health insurance, I can perform a benefits investigation to determine Mr. Carolyne Fiscal expected out-of-pocket cost for testing. If he does not have insurance, he may qualify for free testing through the laboratory [facility]'s Patient Assistance Program. I encouraged Ms. Thresher to discuss carrier screening with her partner and contact me if they would like assistance in coordinating the testing process. Ms. Megna was agreeable to this plan.  I counseled Ms. Lochearn regarding the above risks and available options. The approximate face-to-face time with the genetic counselor was 30 minutes.  In summary:  Discussed carrier screening results and options for follow-up testing  Carrier for spinal muscular atrophy  Desires partner carrier screening. Partner already has testing kit from [facility]. Patient will contact me to facilitate testing through [facility] or another lab if interested  Reviewed low-risk NIPS result  Reduction in risk for Down syndrome,trisomy 18,trisomy 90, sex chromosome aneuploidies, and 22q11.2deletionsyndrome  Reviewed results of ultrasound  No fetal anomalies or markers seen  Reduction in risk for fetal aneuploidy  Offered additional testing and screening  Declined amniocentesis  Reviewed family history concerns   Buelah Manis, MS, Counselling psychologist

## 2019-11-26 NOTE — Progress Notes (Signed)
Pt here for in person assessment of BP and edema due to recent weight gain reported by patient (see MyChart messages from 11/25/19). Pt denies any pain today. Reports tingling sensation to BLE. Minimal pitting edema to bilateral ankles. BP today is 124/78.   Weights taken by patient on the same scale at home are as follows:     11/20/19- 267.6 lbs  11/22/19- 276 lbs  11/25/19- 274.6 lbs  Weight today on office scale is 276 lbs 6.4 oz.   Pt's recent hx, BPs, and weights reviewed with Mathews Robinsons, CNM who states pt should continue monitoring BP weekly and with any symptoms of hypertension. I advised pt to continue checking weight weekly. Pt educated to increase water intake and elevate legs as often as possible.   Fleet Contras RN 11/26/19

## 2019-11-29 NOTE — Progress Notes (Signed)
Patient seen and assessed by nursing staff during this encounter. I have reviewed the chart and agree with the documentation and plan. I have also made any necessary editorial changes.  Kolten Ryback DNP, CNM  11/29/19  8:31 AM    

## 2019-12-03 ENCOUNTER — Encounter: Payer: Self-pay | Admitting: Certified Nurse Midwife

## 2019-12-06 ENCOUNTER — Encounter: Payer: Self-pay | Admitting: Certified Nurse Midwife

## 2019-12-15 ENCOUNTER — Other Ambulatory Visit: Payer: Self-pay | Admitting: Certified Nurse Midwife

## 2019-12-15 ENCOUNTER — Other Ambulatory Visit: Payer: Self-pay

## 2019-12-15 DIAGNOSIS — Z3480 Encounter for supervision of other normal pregnancy, unspecified trimester: Secondary | ICD-10-CM

## 2019-12-16 MED ORDER — PROMETHAZINE HCL 25 MG PO TABS: 25.0000 mg | ORAL_TABLET | Freq: Four times a day (QID) | ORAL | 0 refills | Status: DC | PRN

## 2019-12-16 MED FILL — PROMETHAZINE 25 MG TABLET: 25 | 7 days supply | Qty: 30 | Fill #0

## 2019-12-16 MED FILL — PRENATAL FE 27-1 MG TAB: 27-1 | 30 days supply | Qty: 30 | Fill #1

## 2019-12-17 ENCOUNTER — Ambulatory Visit (INDEPENDENT_AMBULATORY_CARE_PROVIDER_SITE_OTHER): Payer: Medicaid Other | Admitting: Nurse Practitioner

## 2019-12-17 ENCOUNTER — Other Ambulatory Visit: Payer: Medicaid Other

## 2019-12-17 ENCOUNTER — Other Ambulatory Visit: Payer: Self-pay

## 2019-12-17 ENCOUNTER — Encounter: Payer: Self-pay | Admitting: Nurse Practitioner

## 2019-12-17 VITALS — BP 121/78 | HR 105 | Wt 275.5 lb

## 2019-12-17 DIAGNOSIS — Z23 Encounter for immunization: Secondary | ICD-10-CM

## 2019-12-17 DIAGNOSIS — O99342 Other mental disorders complicating pregnancy, second trimester: Secondary | ICD-10-CM

## 2019-12-17 DIAGNOSIS — Z8759 Personal history of other complications of pregnancy, childbirth and the puerperium: Secondary | ICD-10-CM | POA: Diagnosis not present

## 2019-12-17 DIAGNOSIS — G44209 Tension-type headache, unspecified, not intractable: Secondary | ICD-10-CM

## 2019-12-17 DIAGNOSIS — Z3480 Encounter for supervision of other normal pregnancy, unspecified trimester: Secondary | ICD-10-CM

## 2019-12-17 DIAGNOSIS — O99210 Obesity complicating pregnancy, unspecified trimester: Secondary | ICD-10-CM

## 2019-12-17 DIAGNOSIS — E669 Obesity, unspecified: Secondary | ICD-10-CM

## 2019-12-17 DIAGNOSIS — F419 Anxiety disorder, unspecified: Secondary | ICD-10-CM

## 2019-12-17 DIAGNOSIS — O99212 Obesity complicating pregnancy, second trimester: Secondary | ICD-10-CM | POA: Diagnosis not present

## 2019-12-17 DIAGNOSIS — O0990 Supervision of high risk pregnancy, unspecified, unspecified trimester: Secondary | ICD-10-CM

## 2019-12-17 DIAGNOSIS — Z3A27 27 weeks gestation of pregnancy: Secondary | ICD-10-CM | POA: Diagnosis not present

## 2019-12-17 DIAGNOSIS — O0992 Supervision of high risk pregnancy, unspecified, second trimester: Secondary | ICD-10-CM

## 2019-12-17 MED ORDER — PROMETHAZINE HCL 25 MG PO TABS: 25.0000 mg | ORAL_TABLET | Freq: Four times a day (QID) | ORAL | 0 refills | Status: DC | PRN

## 2019-12-17 NOTE — Progress Notes (Signed)
    Subjective:  Judith Terry is a 24 y.o. G2P1001 at [redacted]w[redacted]d being seen today for ongoing prenatal care.  She is currently monitored for the following issues for this high-risk pregnancy and has GERD (gastroesophageal reflux disease); Tension headache; Muscle spasm; Herpes; Supervision of high risk pregnancy, antepartum; History of pre-eclampsia; Obesity in pregnancy; [redacted] weeks gestation of pregnancy; and Nonintractable headache on their problem list.   Patient reports daily headaches and taking medication daily.  Contractions: Not present. Vag. Bleeding: None.  Movement: Present. Denies leaking of fluid.   The following portions of the patient's history were reviewed and updated as appropriate: allergies, current medications, past family history, past medical history, past social history, past surgical history and problem list. Problem list updated.  Objective:   Vitals:   12/17/19 0937  BP: 121/78  Pulse: (!) 105  Weight: 275 lb 8 oz (125 kg)    Fetal Status: Fetal Heart Rate (bpm): 148 Fundal Height: 30 cm Movement: Present     General:  Alert, oriented and cooperative. Patient is in no acute distress.  Skin: Skin is warm and dry. No rash noted.   Cardiovascular: Normal heart rate noted  Respiratory: Normal respiratory effort, no problems with respiration noted  Abdomen: Soft, gravid, appropriate for gestational age. Pain/Pressure: Present     Pelvic:  Cervical exam deferred        Extremities: Normal range of motion.  Edema: Trace  Mental Status: Normal mood and affect. Normal behavior. Normal judgment and thought content.   Urinalysis:      Assessment and Plan:  Pregnancy: G2P1001 at [redacted]w[redacted]d  1. Supervision of high risk pregnancy, antepartum Planning to have wisdom teeth removed next week.  - Tdap vaccine greater than or equal to 7yo IM  2. Supervision of other normal pregnancy, antepartum Baby moving well  Refilled prescriptions that client requested.  Takes Phenergan  every few days.  No drowiness.   - promethazine (PHENERGAN) 25 MG tablet; Take 1 tablet (25 mg total) by mouth every 6 (six) hours as needed for nausea or vomiting.  Dispense: 30 tablet; Refill: 0  3. History of pre-eclampsia Orders for preeclampsia labs were not entered today.  Will notify patient to return for labs next week.  She rides the bus from [place].  4. Obesity in pregnancy   5. Tension headache Taking Butalbital daily.  Advised it may cause rebound headache.  Drink 64 ounces of water daily.  Advised protein at every meal and reviewed sources of protein.  Advised to decrease Butalbital and take flexeril for headaches - client has flexeril at home and agreed to try substituting flexeril for butalbital.  6. Anxiety   Preterm labor symptoms and general obstetric precautions including but not limited to vaginal bleeding, contractions, leaking of fluid and fetal movement were reviewed in detail with the patient. Please refer to After Visit Summary for other counseling recommendations.  Return in about 3 weeks (around 01/07/2020) for virtual ROB.  Nolene Bernheim, RN, MSN, NP-BC Nurse Practitioner, [facility] for [employer], [facility] 12/17/2019 10:05 AM

## 2019-12-18 ENCOUNTER — Other Ambulatory Visit: Payer: Self-pay | Admitting: Nurse Practitioner

## 2019-12-18 DIAGNOSIS — Z8759 Personal history of other complications of pregnancy, childbirth and the puerperium: Secondary | ICD-10-CM

## 2019-12-18 LAB — CBC
Hematocrit: 37.3 % (ref 34.0–46.6)
Hemoglobin: 12.2 g/dL (ref 11.1–15.9)
MCH: 32.9 pg (ref 26.6–33.0)
MCHC: 32.7 g/dL (ref 31.5–35.7)
MCV: 101 fL — ABNORMAL HIGH (ref 79–97)
Platelets: 193 10*3/uL (ref 150–450)
RBC: 3.71 x10E6/uL — ABNORMAL LOW (ref 3.77–5.28)
RDW: 12.2 % (ref 11.7–15.4)
WBC: 9.2 10*3/uL (ref 3.4–10.8)

## 2019-12-18 LAB — GLUCOSE TOLERANCE, 2 HOURS W/ 1HR
Glucose, 1 hour: 228 mg/dL — ABNORMAL HIGH (ref 65–179)
Glucose, 2 hour: 136 mg/dL (ref 65–152)
Glucose, Fasting: 116 mg/dL — ABNORMAL HIGH (ref 65–91)

## 2019-12-18 LAB — RPR: RPR Ser Ql: NONREACTIVE

## 2019-12-18 LAB — HIV ANTIBODY (ROUTINE TESTING W REFLEX): HIV Screen 4th Generation wRfx: NONREACTIVE

## 2019-12-18 NOTE — Progress Notes (Signed)
Will have client see diabetes educator and will draw baseline preeclampsia labs on the same day.  Nolene Bernheim, RN, MSN, NP-BC Nurse Practitioner, [facility] for [employer], [facility] 12/18/2019 11:10 PM

## 2019-12-19 ENCOUNTER — Telehealth: Payer: Self-pay | Admitting: *Deleted

## 2019-12-19 ENCOUNTER — Telehealth: Payer: Self-pay

## 2019-12-19 DIAGNOSIS — O24419 Gestational diabetes mellitus in pregnancy, unspecified control: Secondary | ICD-10-CM

## 2019-12-19 NOTE — Telephone Encounter (Addendum)
Opened in error

## 2019-12-19 NOTE — Telephone Encounter (Signed)
Called patient with abnormal Glucola results. Advised that I will be scheduling a diabetes education visit for her and to have her baseline labs drawn the same day she's in the office. She verbalized understanding and stated she would look out for those appointments.

## 2019-12-25 MED FILL — ACETAMINOPHEN/COD #3 TABLET: 300-30 | 3 days supply | Qty: 12 | Fill #0

## 2019-12-25 MED FILL — CHLORHEXIDINE 0.12% RINSE: 0.12 | 30 days supply | Qty: 473 | Fill #0

## 2019-12-26 ENCOUNTER — Other Ambulatory Visit: Payer: Medicaid Other

## 2019-12-26 ENCOUNTER — Encounter (HOSPITAL_COMMUNITY): Payer: Self-pay | Admitting: Obstetrics and Gynecology

## 2019-12-26 ENCOUNTER — Other Ambulatory Visit: Payer: Self-pay

## 2019-12-26 ENCOUNTER — Other Ambulatory Visit (HOSPITAL_COMMUNITY): Payer: Self-pay | Admitting: Advanced Practice Midwife

## 2019-12-26 ENCOUNTER — Encounter: Payer: Medicaid Other | Attending: Nurse Practitioner | Admitting: Registered"

## 2019-12-26 ENCOUNTER — Inpatient Hospital Stay (HOSPITAL_COMMUNITY)
Admission: AD | Admit: 2019-12-26 | Discharge: 2019-12-26 | Disposition: A | Discharge status: Home / Self Care | Payer: Medicaid Other | Attending: Obstetrics and Gynecology | Admitting: Obstetrics and Gynecology

## 2019-12-26 ENCOUNTER — Ambulatory Visit: Payer: Medicaid Other | Admitting: Registered"

## 2019-12-26 ENCOUNTER — Telehealth: Payer: Self-pay | Admitting: *Deleted

## 2019-12-26 DIAGNOSIS — O24419 Gestational diabetes mellitus in pregnancy, unspecified control: Secondary | ICD-10-CM | POA: Insufficient documentation

## 2019-12-26 DIAGNOSIS — Z3A00 Weeks of gestation of pregnancy not specified: Secondary | ICD-10-CM | POA: Diagnosis not present

## 2019-12-26 DIAGNOSIS — Z3A29 29 weeks gestation of pregnancy: Secondary | ICD-10-CM | POA: Insufficient documentation

## 2019-12-26 DIAGNOSIS — Z8759 Personal history of other complications of pregnancy, childbirth and the puerperium: Secondary | ICD-10-CM

## 2019-12-26 DIAGNOSIS — O26893 Other specified pregnancy related conditions, third trimester: Secondary | ICD-10-CM | POA: Diagnosis not present

## 2019-12-26 DIAGNOSIS — O0993 Supervision of high risk pregnancy, unspecified, third trimester: Secondary | ICD-10-CM | POA: Diagnosis not present

## 2019-12-26 DIAGNOSIS — G44209 Tension-type headache, unspecified, not intractable: Secondary | ICD-10-CM | POA: Diagnosis not present

## 2019-12-26 DIAGNOSIS — O10913 Unspecified pre-existing hypertension complicating pregnancy, third trimester: Secondary | ICD-10-CM | POA: Insufficient documentation

## 2019-12-26 DIAGNOSIS — O163 Unspecified maternal hypertension, third trimester: Secondary | ICD-10-CM

## 2019-12-26 DIAGNOSIS — R030 Elevated blood-pressure reading, without diagnosis of hypertension: Secondary | ICD-10-CM | POA: Diagnosis present

## 2019-12-26 DIAGNOSIS — R519 Headache, unspecified: Secondary | ICD-10-CM

## 2019-12-26 DIAGNOSIS — O0990 Supervision of high risk pregnancy, unspecified, unspecified trimester: Secondary | ICD-10-CM

## 2019-12-26 LAB — COMPREHENSIVE METABOLIC PANEL
ALT: 27 U/L (ref 0–44)
AST: 34 U/L (ref 15–41)
Albumin: 2.9 g/dL — ABNORMAL LOW (ref 3.5–5.0)
Alkaline Phosphatase: 72 U/L (ref 38–126)
Anion gap: 11 (ref 5–15)
BUN: 10 mg/dL (ref 6–20)
CO2: 20 mmol/L — ABNORMAL LOW (ref 22–32)
Calcium: 8.7 mg/dL — ABNORMAL LOW (ref 8.9–10.3)
Chloride: 101 mmol/L (ref 98–111)
Creatinine, Ser: 0.55 mg/dL (ref 0.44–1.00)
GFR calc Af Amer: >60 mL/min (ref >60)
GFR calc non Af Amer: >60 mL/min (ref >60)
Glucose, Bld: 93 mg/dL (ref 70–99)
Potassium: 4 mmol/L (ref 3.5–5.1)
Sodium: 132 mmol/L — ABNORMAL LOW (ref 135–145)
Total Bilirubin: 0.4 mg/dL (ref 0.3–1.2)
Total Protein: 6.2 g/dL — ABNORMAL LOW (ref 6.5–8.1)

## 2019-12-26 LAB — CBC
HCT: 37.7 % (ref 36.0–46.0)
Hemoglobin: 12 g/dL (ref 12.0–15.0)
MCH: 32.4 pg (ref 26.0–34.0)
MCHC: 31.8 g/dL (ref 30.0–36.0)
MCV: 101.9 fL — ABNORMAL HIGH (ref 80.0–100.0)
Platelets: 202 10*3/uL (ref 150–400)
RBC: 3.7 MIL/uL — ABNORMAL LOW (ref 3.87–5.11)
RDW: 13.8 % (ref 11.5–15.5)
WBC: 9.5 10*3/uL (ref 4.0–10.5)
nRBC: 0 % (ref 0.0–0.2)

## 2019-12-26 LAB — URINALYSIS, ROUTINE W REFLEX MICROSCOPIC
Bilirubin Urine: NEGATIVE
Glucose, UA: NEGATIVE mg/dL
Hgb urine dipstick: NEGATIVE
Ketones, ur: NEGATIVE mg/dL
Nitrite: NEGATIVE
Protein, ur: 30 mg/dL — AB
Specific Gravity, Urine: 1.026 (ref 1.005–1.030)
pH: 6 (ref 5.0–8.0)

## 2019-12-26 LAB — PROTEIN / CREATININE RATIO, URINE
Creatinine, Urine: 175.13 mg/dL
Protein Creatinine Ratio: 0.07 mg/mg{Cre} (ref 0.00–0.15)
Total Protein, Urine: 13 mg/dL

## 2019-12-26 MED ORDER — ACCU-CHEK SOFTCLIX LANCETS MISC: 12 refills | Status: DC

## 2019-12-26 MED ORDER — CYCLOBENZAPRINE HCL 5 MG PO TABS
10.0000 mg | ORAL_TABLET | Freq: Once | ORAL | Status: AC
  Administered 2019-12-26: 10 mg via ORAL
  Filled 2019-12-26: qty 2

## 2019-12-26 MED ORDER — ACCU-CHEK GUIDE VI STRP: ORAL_STRIP | 12 refills | Status: DC

## 2019-12-26 MED ORDER — CYCLOBENZAPRINE HCL 5 MG PO TABS: 5.0000 mg | ORAL_TABLET | Freq: Three times a day (TID) | ORAL | 0 refills | Status: DC | PRN

## 2019-12-26 MED FILL — ACCU-CHEK GUIDE TEST STRIP: 30 days supply | Qty: 100 | Fill #0

## 2019-12-26 MED FILL — ACCU-CHEK SOFTCLIX LANCETS: 30 days supply | Qty: 100 | Fill #0

## 2019-12-26 NOTE — Progress Notes (Signed)
Patient was seen on 12/26/19 for Gestational Diabetes self-management. EDD 03/12/20. Patient states no history of GDM. Diet history obtained. Patient eats variety of all food groups. Beverages include water, sweet tea.  Patient is consuming excess carbohydrates in the form of starches, fruit, sweet tea.   Patient arrived late and RD was not able to provide full education.  The following learning objectives were met by the patient :   Demonstrates ability to adjust current meals to reduce carbohydrate intake.   States when to check blood glucose levels  Demonstrates proper blood glucose monitoring techniques   Plan:  Aim for 3 Carbohydrate Choices per meal (45 grams) +/- 1 either way  Aim for 1-2 Carbohydrate Choices per snack Begin reading food labels for Total Carbohydrate of foods Begin checking Blood Glucose before breakfast and 2 hours after first bite of breakfast, lunch and dinner as directed by MD  Bring Log Book/Sheet and meter to every medical appointment OR use Baby Scripts -(glucose management added today and patient notified.) Take medication if directed by MD  Blood glucose monitor given: Accu-chek Guide Me Lot #[id number] Exp: 6/022022 Blood Glucose: 108 mg/dL  Blood glucose monitor Rx called into pharmacy: Accu Check Guide strips and Softclix lancets  Patient instructed to monitor glucose levels: FBS: 60 - 95 mg/dl 2 hour: <120 mg/dl  Patient received the following handouts:  Nutrition Diabetes and Pregnancy  Carbohydrate Counting List  Blood glucose Log Sheet  Patient will be seen for follow-up in 1 week or as needed.

## 2019-12-26 NOTE — Telephone Encounter (Signed)
Received call from [facility] re; elevated BP of 140/76 Linda,RN

## 2019-12-26 NOTE — MAU Note (Addendum)
Doing weekly b/p checks. B/P today 140s/70s. Having tingling feeling hands and both lower legs today. Denies VB or LOF. Good FM. No pain currently

## 2019-12-26 NOTE — MAU Provider Note (Signed)
Chief Complaint:  Hypertension   First Provider Initiated Contact with Patient 12/26/19 2208      HPI: Judith Terry is a 24 y.o. G2P1001 at [redacted]w[redacted]d who presents to maternity admissions reporting elevated blood pressures at home and intermittent h/a. She has diagnosis of tension h/a before pregnancy and reports the h/a are similar. The pain is in her neck and radiates up into the back of her head.  She took Tylenol at home and the h/a resolved but then returned. She has prescription for Fioricet but was told to limit and only take it when absolutely necessary.  There are no other symptoms. She has not tried any treatments .  She reports some tingling feelings in her hands and lower legs earlier today, none now. She denies any pain.  She has not tried any treatments.There is good fetal movement.     HPI  Past Medical History: Past Medical History:  Diagnosis Date  . Anxiety   . Chronic back pain   . GERD (gastroesophageal reflux disease)   . Headache   . Herpes   . Infection    UTI  . Muscle spasm   . Pregnancy induced hypertension   . Trichomoniasis     Past obstetric history: OB History  Gravida Para Term Preterm AB Living  2 1 1     1   SAB TAB Ectopic Multiple Live Births        0 1    # Outcome Date GA Lbr Len/2nd Weight Sex Delivery Anes PTL Lv  2 Current           1 Term 06/04/15 [redacted]w[redacted]d / 00:22 3050 g F Vag-Spont None  LIV    Past Surgical History: Past Surgical History:  Procedure Laterality Date  . NO PAST SURGERIES      Family History: Family History  Problem Relation Age of Onset  . Diabetes Mother   . Diabetes Father   . Heart disease Father   . Kidney failure Father     Social History: Social History   Tobacco Use  . Smoking status: Never Smoker  . Smokeless tobacco: Never Used  Substance Use Topics  . Alcohol use: No  . Drug use: No    Allergies: No Known Allergies  Meds:  No medications prior to admission.    ROS:  Review of Systems   Constitutional: Negative for chills, fatigue and fever.  Eyes: Negative for visual disturbance.  Respiratory: Negative for shortness of breath.   Cardiovascular: Negative for chest pain.  Gastrointestinal: Negative for abdominal pain, nausea and vomiting.  Genitourinary: Negative for difficulty urinating, dysuria, flank pain, pelvic pain, vaginal bleeding, vaginal discharge and vaginal pain.  Neurological: Negative for dizziness and headaches.  Psychiatric/Behavioral: Negative.      I have reviewed patient's Past Medical Hx, Surgical Hx, Family Hx, Social Hx, medications and allergies.   Physical Exam   Patient Vitals for the past 24 hrs:  BP Temp Pulse Resp Height Weight  12/26/19 2216 138/79 - 96 - - -  12/26/19 2201 136/71 - 96 - - -  12/26/19 2146 125/70 - (!) 108 - - -  12/26/19 2131 136/79 - 92 - - -  12/26/19 2116 128/76 - 100 - - -  12/26/19 2101 139/79 - 99 - - -  12/26/19 2046 136/71 - 97 - - -  12/26/19 2034 134/80 - (!) 107 - - -  12/26/19 2005 (!) 148/87 98.2 F (36.8 C) 100 18 - -  12/26/19  2001 - 98.4 F (36.9 C) - - 5\' 9"  (1.753 m) 124.3 kg   Constitutional: Well-developed, well-nourished female in no acute distress.  HEART: normal rate, heart sounds, regular rhythm RESP: normal effort, lung sounds clear and equal bilaterally GI: Abd soft, non-tender, gravid appropriate for gestational age.  MS: Extremities nontender, no edema, normal ROM Neurologic: Alert and oriented x 4.  GU: Neg CVAT.     FHT:  Baseline 145, moderate variability, accelerations present, no decelerations Contractions: None on toco or to palpation   Labs: Results for orders placed or performed during the hospital encounter of 12/26/19 (from the past 24 hour(s))  CBC     Status: Abnormal   Collection Time: 12/26/19  8:17 PM  Result Value Ref Range   WBC 9.5 4.0 - 10.5 K/uL   RBC 3.70 (L) 3.87 - 5.11 MIL/uL   Hemoglobin 12.0 12.0 - 15.0 g/dL   HCT 12/28/19 63.8 - 75.6 %   MCV 101.9 (H)  80.0 - 100.0 fL   MCH 32.4 26.0 - 34.0 pg   MCHC 31.8 30.0 - 36.0 g/dL   RDW 43.3 29.5 - 18.8 %   Platelets 202 150 - 400 K/uL   nRBC 0.0 0.0 - 0.2 %  Comprehensive metabolic panel     Status: Abnormal   Collection Time: 12/26/19  8:17 PM  Result Value Ref Range   Sodium 132 (L) 135 - 145 mmol/L   Potassium 4.0 3.5 - 5.1 mmol/L   Chloride 101 98 - 111 mmol/L   CO2 20 (L) 22 - 32 mmol/L   Glucose, Bld 93 70 - 99 mg/dL   BUN 10 6 - 20 mg/dL   Creatinine, Ser 12/28/19 0.44 - 1.00 mg/dL   Calcium 8.7 (L) 8.9 - 10.3 mg/dL   Total Protein 6.2 (L) 6.5 - 8.1 g/dL   Albumin 2.9 (L) 3.5 - 5.0 g/dL   AST 34 15 - 41 U/L   ALT 27 0 - 44 U/L   Alkaline Phosphatase 72 38 - 126 U/L   Total Bilirubin 0.4 0.3 - 1.2 mg/dL   GFR calc non Af Amer >60 >60 mL/min   GFR calc Af Amer >60 >60 mL/min   Anion gap 11 5 - 15  Protein / creatinine ratio, urine     Status: None   Collection Time: 12/26/19  8:40 PM  Result Value Ref Range   Creatinine, Urine 175.13 mg/dL   Total Protein, Urine 13 mg/dL   Protein Creatinine Ratio 0.07 0.00 - 0.15 mg/mg[Cre]  Urinalysis, Routine w reflex microscopic     Status: Abnormal   Collection Time: 12/26/19  8:40 PM  Result Value Ref Range   Color, Urine YELLOW YELLOW   APPearance CLOUDY (A) CLEAR   Specific Gravity, Urine 1.026 1.005 - 1.030   pH 6.0 5.0 - 8.0   Glucose, UA NEGATIVE NEGATIVE mg/dL   Hgb urine dipstick NEGATIVE NEGATIVE   Bilirubin Urine NEGATIVE NEGATIVE   Ketones, ur NEGATIVE NEGATIVE mg/dL   Protein, ur 30 (A) NEGATIVE mg/dL   Nitrite NEGATIVE NEGATIVE   Leukocytes,Ua MODERATE (A) NEGATIVE   RBC / HPF 0-5 0 - 5 RBC/hpf   WBC, UA 6-10 0 - 5 WBC/hpf   Bacteria, UA RARE (A) NONE SEEN   Squamous Epithelial / LPF 11-20 0 - 5   Mucus PRESENT    Ca Oxalate Crys, UA PRESENT    O/Positive/-- (12/22 1204)  Imaging:  No results found.  MAU Course/MDM: Orders Placed This Encounter  Procedures  . CBC  . Comprehensive metabolic panel  . Protein /  creatinine ratio, urine  . Urinalysis, Routine w reflex microscopic  . AMB referral to headache clinic  . Discharge patient    Meds ordered this encounter  Medications  . cyclobenzaprine (FLEXERIL) tablet 10 mg  . cyclobenzaprine (FLEXERIL) 5 MG tablet    Sig: Take 1 tablet (5 mg total) by mouth 3 (three) times daily as needed for muscle spasms.    Dispense:  30 tablet    Refill:  0    Order Specific Question:   Supervising Provider    Answer:   Aletha Halim [id number]  . cyclobenzaprine (FLEXERIL) 5 MG tablet    Sig: Take 1-2 tablets (5-10 mg total) by mouth 3 (three) times daily as needed for muscle spasms.    Dispense:  30 tablet    Refill:  0    Order Specific Question:   Supervising Provider    Answer:   Aletha Halim [[id number]]     NST reviewed and reactive Initial BP 148/87 then several other normal blood pressures. Only other elevated BP in pregnancy was today on home cuff.  Will continue to monitor with BP check in office Monday.  No s/sx of PEC in MAU PEC labs wnl with P/C ratio of 0.07 Rx for Flexeril for tension headaches Preeclampsia precautions Return to MAU as needed for emergencies    Assessment: 1. Headache in pregnancy, antepartum, third trimester   2. Supervision of high risk pregnancy, antepartum   3. Hypertension affecting pregnancy in third trimester   4. Tension headache     Plan: Discharge home Labor precautions and fetal kick counts Follow-up [facility] Follow up.   Specialty: Obstetrics and Gynecology Why: Noralee Space will call you to set up a blood pressure check visit on Monday, 12/30/19. Return to MAU for worsening symptoms or emergencies. Contact information: [address], [place] [id number] [place] [id number] [phone]         Allergies as of 12/26/2019   No Known Allergies     Medication List    TAKE these medications   Accu-Chek Guide  test strip Generic drug: glucose blood Gestational Diabetes Give enough for month   Accu-Chek Softclix Lancets lancets Gestational Diabetes Give enough for month Use as instructed   acetaminophen 500 MG tablet Commonly known as: TYLENOL Take 500 mg by mouth every 6 (six) hours as needed for mild pain or fever.   aspirin 81 MG chewable tablet Chew 1 tablet (81 mg total) by mouth daily.   busPIRone 15 MG tablet Commonly known as: BUSPAR Take 1 tablet (15 mg total) by mouth 2 (two) times daily.   Butalbital-APAP-Caffeine 50-325-40 MG capsule Take 1 capsule by mouth every 6 (six) hours as needed for headache.   calcium carbonate 500 MG chewable tablet Commonly known as: TUMS - dosed in mg elemental calcium Chew 1 tablet by mouth daily.   cyclobenzaprine 5 MG tablet Commonly known as: FLEXERIL Take 1 tablet (5 mg total) by mouth 3 (three) times daily as needed for muscle spasms. What changed: You were already taking a medication with the same name, and this prescription was added. Make sure you understand how and when to take each.   cyclobenzaprine 5 MG tablet Commonly known as: FLEXERIL Take 1-2 tablets (5-10 mg total) by mouth 3 (three) times daily as needed for muscle spasms. What changed: how much to take  omeprazole 20 MG tablet Commonly known as: PRILOSEC OTC Take 1 tablet (20 mg total) by mouth daily.   prenatal vitamin w/FE, FA 27-1 MG Tabs tablet Take 1 tablet by mouth daily at 12 noon.   promethazine 25 MG tablet Commonly known as: PHENERGAN Take 1 tablet (25 mg total) by mouth every 6 (six) hours as needed for nausea or vomiting.   valACYclovir 500 MG tablet Commonly known as: VALTREX Take 1 tablet (500 mg total) by mouth 2 (two) times daily.       Sharen Counter Certified Nurse-Midwife 12/27/2019 7:07 AM

## 2019-12-26 NOTE — Addendum Note (Signed)
Addended by: Ernestina Patches on: 12/26/2019 11:56 AM   Modules accepted: Orders

## 2019-12-26 NOTE — Telephone Encounter (Signed)
See MyChart messages addressing this today. Melvern Ramone,RN

## 2019-12-26 NOTE — Progress Notes (Signed)
Written and verbal d/c instructions given and understanding voiced. Understands has script at pharmacy for [facility]

## 2019-12-27 LAB — COMPREHENSIVE METABOLIC PANEL
ALT: 25 IU/L (ref 0–32)
AST: 34 IU/L (ref 0–40)
Albumin/Globulin Ratio: 1.5 (ref 1.2–2.2)
Albumin: 3.7 g/dL — ABNORMAL LOW (ref 3.9–5.0)
Alkaline Phosphatase: 89 IU/L (ref 39–117)
BUN/Creatinine Ratio: 22 (ref 9–23)
BUN: 11 mg/dL (ref 6–20)
Bilirubin Total: <0.2 mg/dL (ref 0.0–1.2)
CO2: 19 mmol/L — ABNORMAL LOW (ref 20–29)
Calcium: 9.1 mg/dL (ref 8.7–10.2)
Chloride: 104 mmol/L (ref 96–106)
Creatinine, Ser: 0.49 mg/dL — ABNORMAL LOW (ref 0.57–1.00)
GFR calc Af Amer: 159 mL/min/{1.73_m2} (ref >59)
GFR calc non Af Amer: 138 mL/min/{1.73_m2} (ref >59)
Globulin, Total: 2.5 g/dL (ref 1.5–4.5)
Glucose: 86 mg/dL (ref 65–99)
Potassium: 4.7 mmol/L (ref 3.5–5.2)
Sodium: 137 mmol/L (ref 134–144)
Total Protein: 6.2 g/dL (ref 6.0–8.5)

## 2019-12-27 LAB — URINALYSIS, ROUTINE W REFLEX MICROSCOPIC
Bilirubin, UA: NEGATIVE
Leukocytes,UA: NEGATIVE
Nitrite, UA: NEGATIVE
RBC, UA: NEGATIVE
Specific Gravity, UA: >=1.03 — AB (ref 1.005–1.030)
Urobilinogen, Ur: 1 mg/dL (ref 0.2–1.0)
pH, UA: 6 (ref 5.0–7.5)

## 2019-12-27 LAB — PROTEIN / CREATININE RATIO, URINE
Creatinine, Urine: 230.4 mg/dL
Protein, Ur: 28.5 mg/dL
Protein/Creat Ratio: 124 mg/g creat (ref 0–200)

## 2019-12-31 ENCOUNTER — Ambulatory Visit (INDEPENDENT_AMBULATORY_CARE_PROVIDER_SITE_OTHER): Payer: Medicaid Other | Admitting: *Deleted

## 2019-12-31 ENCOUNTER — Encounter: Payer: Self-pay | Admitting: *Deleted

## 2019-12-31 ENCOUNTER — Ambulatory Visit: Payer: Medicaid Other | Admitting: Registered"

## 2019-12-31 ENCOUNTER — Other Ambulatory Visit: Payer: Self-pay

## 2019-12-31 ENCOUNTER — Telehealth: Payer: Self-pay | Admitting: Radiology

## 2019-12-31 ENCOUNTER — Encounter: Payer: Medicaid Other | Admitting: Registered"

## 2019-12-31 VITALS — BP 126/66 | HR 98 | Temp 99.0°F | Ht 69.0 in | Wt 274.4 lb

## 2019-12-31 DIAGNOSIS — O99810 Abnormal glucose complicating pregnancy: Secondary | ICD-10-CM | POA: Insufficient documentation

## 2019-12-31 DIAGNOSIS — Z0130 Encounter for examination of blood pressure without abnormal findings: Secondary | ICD-10-CM

## 2019-12-31 DIAGNOSIS — O24419 Gestational diabetes mellitus in pregnancy, unspecified control: Secondary | ICD-10-CM | POA: Diagnosis not present

## 2019-12-31 DIAGNOSIS — O26893 Other specified pregnancy related conditions, third trimester: Secondary | ICD-10-CM

## 2019-12-31 NOTE — Progress Notes (Signed)
Patient was seen on 12/31/19 for follow-up assessment and education for Gestational Diabetes. EDD 03/12/20. Patient states changes to diet/lifestyle including stopped drinking sweet tea, reduced some carbohydrates   Patient is testing blood glucose as directed pre breakfast and 2 hours after each meal. Review of Babyscripts:      The following learning objectives reviewed during follow-up visit:  Identifying carbohydrates in meals that are above target  Discussed ways to adjust meals to reduce carbohydrates  Discussed role of exercise  Discussed protein for breakfast  Plan:  . Use blood sugar readings to tell which meals keep your blood sugar in range . Try exercise after dinner to also help bring those numbers down as well as may help keep fasting number within range . Add protein to breakfast   Patient instructed to monitor glucose levels: FBS: 60 - 95 mg/dl 2 hour: <403 mg/dl  Patient received the following handouts:    Patient will be seen for follow-up as needed.

## 2019-12-31 NOTE — Progress Notes (Signed)
Chart reviewed for nurse visit. Agree with plan of care.   Anistyn Graddy L, DO 12/31/2019 5:07 PM

## 2019-12-31 NOTE — Telephone Encounter (Signed)
Called and left message for patient to call [id number] to schedule appointment for referral to Headache specialist.

## 2019-12-31 NOTE — Progress Notes (Signed)
Pt here for BP check - 126/66. She reports having a mild H/A and has not taken any of the medication for pain as prescribed. Pt denies visual disturbances. Pt instructed to continue checking BP as previously requested and enter results to [facility] app. Pt was also advised to contact [facility] office in order to schedule appt with the headache specialist. Next PNV scheduled on 5/4. Pt has scheduled appt today with Diabetes Educator. Pt voiced understanding of all information and instructions given.

## 2020-01-06 ENCOUNTER — Encounter: Payer: Self-pay | Admitting: *Deleted

## 2020-01-07 ENCOUNTER — Telehealth (INDEPENDENT_AMBULATORY_CARE_PROVIDER_SITE_OTHER): Payer: Medicaid Other | Admitting: Advanced Practice Midwife

## 2020-01-07 ENCOUNTER — Other Ambulatory Visit: Payer: Self-pay

## 2020-01-07 VITALS — BP 148/88 | HR 94

## 2020-01-07 DIAGNOSIS — O0993 Supervision of high risk pregnancy, unspecified, third trimester: Secondary | ICD-10-CM

## 2020-01-07 DIAGNOSIS — M62838 Other muscle spasm: Secondary | ICD-10-CM

## 2020-01-07 DIAGNOSIS — O2643 Herpes gestationis, third trimester: Secondary | ICD-10-CM

## 2020-01-07 DIAGNOSIS — G44209 Tension-type headache, unspecified, not intractable: Secondary | ICD-10-CM

## 2020-01-07 DIAGNOSIS — Z3A30 30 weeks gestation of pregnancy: Secondary | ICD-10-CM

## 2020-01-07 DIAGNOSIS — O26893 Other specified pregnancy related conditions, third trimester: Secondary | ICD-10-CM

## 2020-01-07 DIAGNOSIS — O0990 Supervision of high risk pregnancy, unspecified, unspecified trimester: Secondary | ICD-10-CM

## 2020-01-07 DIAGNOSIS — O99353 Diseases of the nervous system complicating pregnancy, third trimester: Secondary | ICD-10-CM

## 2020-01-07 DIAGNOSIS — O163 Unspecified maternal hypertension, third trimester: Secondary | ICD-10-CM

## 2020-01-07 DIAGNOSIS — O99613 Diseases of the digestive system complicating pregnancy, third trimester: Secondary | ICD-10-CM

## 2020-01-07 DIAGNOSIS — K219 Gastro-esophageal reflux disease without esophagitis: Secondary | ICD-10-CM

## 2020-01-07 DIAGNOSIS — O24415 Gestational diabetes mellitus in pregnancy, controlled by oral hypoglycemic drugs: Secondary | ICD-10-CM | POA: Insufficient documentation

## 2020-01-07 MED ORDER — METFORMIN HCL 500 MG PO TABS: 500.0000 mg | ORAL_TABLET | Freq: Two times a day (BID) | ORAL | 5 refills | Status: DC

## 2020-01-07 MED FILL — METFORMIN HCL 500 MG TABS: 500 | 30 days supply | Qty: 60 | Fill #0

## 2020-01-07 NOTE — Progress Notes (Signed)
OBSTETRICS PRENATAL VIRTUAL VISIT ENCOUNTER NOTE  Provider location: [facility] for [facility]   I connected with [facility] on 01/07/20 at 10:15 AM EDT by MyChart Video Encounter at home and verified that I am speaking with the correct person using two identifiers.   I discussed the limitations, risks, security and privacy concerns of performing an evaluation and management service virtually and the availability of in person appointments. I also discussed with the patient that there may be a patient responsible charge related to this service. The patient expressed understanding and agreed to proceed. Subjective:  Judith Terry is a 24 y.o. G2P1001 at [redacted]w[redacted]d being seen today for ongoing prenatal care.  She is currently monitored for the following issues for this high-risk pregnancy and has GERD (gastroesophageal reflux disease); Tension headache; Muscle spasm; Herpes; Supervision of high risk pregnancy, antepartum; History of pre-eclampsia; Obesity in pregnancy; [redacted] weeks gestation of pregnancy; Nonintractable headache; Abnormal glucose tolerance test (GTT) during pregnancy, antepartum; and Gestational diabetes mellitus (GDM) treated with oral hypoglycemic therapy on their problem list.  Patient reports no complaints.  Contractions: Not present. Vag. Bleeding: None.  Movement: Present. Denies any leaking of fluid.   The following portions of the patient's history were reviewed and updated as appropriate: allergies, current medications, past family history, past medical history, past social history, past surgical history and problem list.   Objective:   Vitals:   01/07/20 1000  BP: (!) 148/88  Pulse: 94    Fetal Status:     Movement: Present     General:  Alert, oriented and cooperative. Patient is in no acute distress.  Respiratory: Normal respiratory effort, no problems with respiration noted  Mental Status: Normal mood and affect. Normal behavior.  Normal judgment and thought content.  Rest of physical exam deferred due to type of encounter  Imaging: No results found.  Assessment and Plan:  Pregnancy: G2P1001 at [redacted]w[redacted]d 1. Supervision of high risk pregnancy, antepartum - Discussed daily kick counts, interventions for low kick counts  2. Gestational diabetes mellitus (GDM) in third trimester controlled on oral hypoglycemic drug - Continues to have elevated fasting CBGs as well as predominantly elevated postprandial readings - Discussed with Dr. Jolayne Panther, who advised initiation of Metformin BID - metFORMIN (GLUCOPHAGE) 500 MG tablet; Take 1 tablet (500 mg total) by mouth 2 (two) times daily with a meal.  Dispense: 60 tablet; Refill: 5  3. Elevated Blood Pressure without Diagnosis of Hypertension - BP elevated in MAU 04/22 and on home cuff during virtual appointment today - Normal PEC labs 12/26/2019 - Patient has appointment for headache eval at [facility] 01/10/2020. Message sent requesting BP check and redraw of PEC labs during this appointment  Preterm labor symptoms and general obstetric precautions including but not limited to vaginal bleeding, contractions, leaking of fluid and fetal movement were reviewed in detail with the patient. I discussed the assessment and treatment plan with the patient. The patient was provided an opportunity to ask questions and all were answered. The patient agreed with the plan and demonstrated an understanding of the instructions. The patient was advised to call back or seek an in-person office evaluation/go to MAU at [facility] for any urgent or concerning symptoms. Please refer to After Visit Summary for other counseling recommendations.   I provided fifteen minutes of face-to-face time during this encounter.  Follow-up: --Patient to have in-person BP check with RN to determine diagnosis of Gestational Hypertension --Patient to have MD  appointment in one week to review CBG log  after one week on Metformin BID   Darlina Rumpf, [place] for [employer], [place]

## 2020-01-07 NOTE — Patient Instructions (Signed)

## 2020-01-10 ENCOUNTER — Other Ambulatory Visit: Payer: Self-pay

## 2020-01-10 ENCOUNTER — Ambulatory Visit (INDEPENDENT_AMBULATORY_CARE_PROVIDER_SITE_OTHER): Payer: Medicaid Other | Admitting: Physician Assistant

## 2020-01-10 ENCOUNTER — Encounter: Payer: Self-pay | Admitting: Physician Assistant

## 2020-01-10 VITALS — BP 124/86 | HR 94

## 2020-01-10 DIAGNOSIS — G4440 Drug-induced headache, not elsewhere classified, not intractable: Secondary | ICD-10-CM

## 2020-01-10 DIAGNOSIS — Z3480 Encounter for supervision of other normal pregnancy, unspecified trimester: Secondary | ICD-10-CM

## 2020-01-10 DIAGNOSIS — O26893 Other specified pregnancy related conditions, third trimester: Secondary | ICD-10-CM | POA: Diagnosis not present

## 2020-01-10 DIAGNOSIS — G44209 Tension-type headache, unspecified, not intractable: Secondary | ICD-10-CM | POA: Diagnosis not present

## 2020-01-10 DIAGNOSIS — Z3A31 31 weeks gestation of pregnancy: Secondary | ICD-10-CM

## 2020-01-10 DIAGNOSIS — O0990 Supervision of high risk pregnancy, unspecified, unspecified trimester: Secondary | ICD-10-CM

## 2020-01-10 LAB — CBC
Hematocrit: 39.7 % (ref 34.0–46.6)
Hemoglobin: 13.4 g/dL (ref 11.1–15.9)
MCH: 33.1 pg — ABNORMAL HIGH (ref 26.6–33.0)
MCHC: 33.8 g/dL (ref 31.5–35.7)
MCV: 98 fL — ABNORMAL HIGH (ref 79–97)
Platelets: 181 10*3/uL (ref 150–450)
RBC: 4.05 x10E6/uL (ref 3.77–5.28)
RDW: 12.3 % (ref 11.7–15.4)
WBC: 9.3 10*3/uL (ref 3.4–10.8)

## 2020-01-10 MED ORDER — PROMETHAZINE HCL 25 MG PO TABS: 25.0000 mg | ORAL_TABLET | Freq: Four times a day (QID) | ORAL | 0 refills | Status: DC | PRN

## 2020-01-10 MED ORDER — BUTALBITAL-APAP-CAFFEINE 50-325-40 MG PO CAPS: 1.0000 | ORAL_CAPSULE | Freq: Four times a day (QID) | ORAL | 0 refills | Status: DC | PRN

## 2020-01-10 MED ORDER — CYCLOBENZAPRINE HCL 10 MG PO TABS: 10.0000 mg | ORAL_TABLET | Freq: Three times a day (TID) | ORAL | 2 refills | Status: DC | PRN

## 2020-01-10 MED FILL — CYCLOBENZAPRINE 10 MG TAB: 10 | 10 days supply | Qty: 30 | Fill #0

## 2020-01-10 MED FILL — PROMETHAZINE 25 MG TABLET: 25 | 5 days supply | Qty: 20 | Fill #0

## 2020-01-10 MED FILL — BUTALB-ACETAMIN-CAFF 50-325: 50-325-40 | 7 days supply | Qty: 30 | Fill #0

## 2020-01-10 NOTE — Progress Notes (Signed)
History:  Judith Terry is a 24 y.o. G2P1001 who presents to clinic today for new evaluation of headache.  The headaches are occurring at least 3 days a week and sometimes daily.  They usually last around 4 hours but she has had some that last all day long.  In January she had a headache that lasted for 4 days straight, even with medication.  They can be severe but are usually mild to moderate.  The pain is located mid frontal and there is throbbing with the worst ones.   The more mild HAs are steady pressure.  Movement makes it worse.  Relaxation and rest help it get better.  Lights and noises make the worst headaches increase.  There have been cases of nausea.  Denies vomiting, vision changes, numbness, tingling.  She has headaches when her blood pressure is very elevated.     She was using fioricet and flexeril roughly each every other day.  She uses Tylenol sometimes and that is helpful but may not always take it completely.   She uses Phenergan several days a week for nausea - related to the pregnancy.   Pt c/o left sided ear pain.    Pt spends her days at home with her 33 year old daughter.  She walks outside when it is nice for maybe a day.  She tries to rest.    She drinks more water lately and sweet tea or juice.   She gets up at 10am or later.  She naps during the day at least once (up to 3 times) for 2-3 hours each.  She goes to bed between 10pm and 2 am.   She eats 2 meals a day plus 3-4 snacks each day.  She reports she is eating according to the recommendations given by nutrition.  She was prescribed metformin earlier this week but has not yet started taking the medication.    She reports PCP has told her she has tension headaches.  Headaches worse when working (not currently employed).  She feels like her diet contributes to headaches (lots of sodium) as well as activity - bending down for prolonged periods.      Past Medical History:  Diagnosis Date  . Anxiety   .  Chronic back pain   . GERD (gastroesophageal reflux disease)   . Headache   . Herpes   . Infection    UTI  . Muscle spasm   . Pregnancy induced hypertension   . Trichomoniasis     Social History   Socioeconomic History  . Marital status: Single    Spouse name: Not on file  . Number of children: Not on file  . Years of education: Not on file  . Highest education level: Not on file  Occupational History  . Not on file  Tobacco Use  . Smoking status: Never Smoker  . Smokeless tobacco: Never Used  Substance and Sexual Activity  . Alcohol use: No  . Drug use: No  . Sexual activity: Yes    Birth control/protection: None  Other Topics Concern  . Not on file  Social History Narrative  . Not on file   Social Determinants of Health   Financial Resource Strain:   . Difficulty of Paying Living Expenses:   Food Insecurity:   . Worried About Programme researcher, broadcasting/film/video in the Last Year:   . Barista in the Last Year:   Transportation Needs:   . Freight forwarder (Medical):   Marland Kitchen  Lack of Transportation (Non-Medical):   Physical Activity:   . Days of Exercise per Week:   . Minutes of Exercise per Session:   Stress:   . Feeling of Stress :   Social Connections:   . Frequency of Communication with Friends and Family:   . Frequency of Social Gatherings with Friends and Family:   . Attends Religious Services:   . Active Member of Clubs or Organizations:   . Attends Banker Meetings:   Marland Kitchen Marital Status:   Intimate Partner Violence:   . Fear of Current or Ex-Partner:   . Emotionally Abused:   Marland Kitchen Physically Abused:   . Sexually Abused:     Family History  Problem Relation Age of Onset  . Diabetes Mother   . Diabetes Father   . Heart disease Father   . Kidney failure Father     No Known Allergies  Current Outpatient Medications on File Prior to Visit  Medication Sig Dispense Refill  . Accu-Chek Softclix Lancets lancets Gestational Diabetes Give enough  for month Use as instructed 50 each 12  . acetaminophen (TYLENOL) 500 MG tablet Take 500 mg by mouth every 6 (six) hours as needed for mild pain or fever.     Marland Kitchen aspirin 81 MG chewable tablet Chew 1 tablet (81 mg total) by mouth daily. 90 tablet 3  . busPIRone (BUSPAR) 15 MG tablet Take 1 tablet (15 mg total) by mouth 2 (two) times daily. (Patient not taking: Reported on 12/31/2019) 60 tablet 3  . Butalbital-APAP-Caffeine 50-325-40 MG capsule Take 1 capsule by mouth every 6 (six) hours as needed for headache. (Patient not taking: Reported on 12/31/2019) 30 capsule 0  . calcium carbonate (TUMS - DOSED IN MG ELEMENTAL CALCIUM) 500 MG chewable tablet Chew 1 tablet by mouth daily.    . cyclobenzaprine (FLEXERIL) 5 MG tablet Take 1 tablet (5 mg total) by mouth 3 (three) times daily as needed for muscle spasms. 30 tablet 0  . cyclobenzaprine (FLEXERIL) 5 MG tablet Take 1-2 tablets (5-10 mg total) by mouth 3 (three) times daily as needed for muscle spasms. (Patient not taking: Reported on 12/31/2019) 30 tablet 0  . glucose blood (ACCU-CHEK GUIDE) test strip Gestational Diabetes Give enough for month 50 each 12  . metFORMIN (GLUCOPHAGE) 500 MG tablet Take 1 tablet (500 mg total) by mouth 2 (two) times daily with a meal. 60 tablet 5  . omeprazole (PRILOSEC OTC) 20 MG tablet Take 1 tablet (20 mg total) by mouth daily. 30 tablet 3  . prenatal vitamin w/FE, FA (PRENATAL 1 + 1) 27-1 MG TABS tablet Take 1 tablet by mouth daily at 12 noon. 30 tablet 11  . promethazine (PHENERGAN) 25 MG tablet Take 1 tablet (25 mg total) by mouth every 6 (six) hours as needed for nausea or vomiting. (Patient not taking: Reported on 12/31/2019) 30 tablet 0  . valACYclovir (VALTREX) 500 MG tablet Take 1 tablet (500 mg total) by mouth 2 (two) times daily. (Patient not taking: Reported on 12/31/2019) 30 tablet 2   No current facility-administered medications on file prior to visit.     Review of Systems:  All pertinent positive/negative  included in HPI, all other review of systems are negative   Objective:  Physical Exam BP 124/86   Pulse 94   LMP 05/29/2019  CONSTITUTIONAL: Well-developed, well-nourished female in no acute distress.  EYES: EOM intact ENT: Normocephalic, cerumen impaction bilaterally CARDIOVASCULAR: Regular rate RESPIRATORY: Normal rate.  MUSCULOSKELETAL: Normal ROM SKIN:  Warm, dry without erythema  NEUROLOGICAL: Alert, oriented, CN II-XII grossly intact, Appropriate balance PSYCH: Normal behavior, mood   Assessment & Plan:  Assessment: 1. Supervision of high risk pregnancy, antepartum   2. Supervision of other normal pregnancy, antepartum   3. Headache in pregnancy, antepartum, third trimester   4. Tension headache   5. Rebound headache   worsening of headache   Plan: Blood pressure good today! Drink more water Labs pending Flexeril - use as primary medication as it will not cause rebound HA/ dependence.  Pt may break in half.  Sedation precautions discussed.  Limit Fioricet/Tylenol and Phenergan but okay to use each no more than 1-2 times per week Use Biofreeze liberally (careful around the eyes) Significant discussion around her daily habits - advised to keep routine for sleeping/eating/exercising, exercise more, do yoga, eliminate caffeine and sugary drinks Follow-up PRN - RTC if no improvement or worsening  Paticia Stack, PA-C 01/10/2020 10:47 AM

## 2020-01-11 LAB — COMPREHENSIVE METABOLIC PANEL
ALT: 26 IU/L (ref 0–32)
AST: 52 IU/L — ABNORMAL HIGH (ref 0–40)
Albumin/Globulin Ratio: 1.3 (ref 1.2–2.2)
Albumin: 3.7 g/dL — ABNORMAL LOW (ref 3.9–5.0)
Alkaline Phosphatase: 97 IU/L (ref 39–117)
BUN/Creatinine Ratio: 19 (ref 9–23)
BUN: 9 mg/dL (ref 6–20)
Bilirubin Total: <0.2 mg/dL (ref 0.0–1.2)
CO2: 19 mmol/L — ABNORMAL LOW (ref 20–29)
Calcium: 9.3 mg/dL (ref 8.7–10.2)
Chloride: 105 mmol/L (ref 96–106)
Creatinine, Ser: 0.48 mg/dL — ABNORMAL LOW (ref 0.57–1.00)
GFR calc Af Amer: 160 mL/min/{1.73_m2} (ref >59)
GFR calc non Af Amer: 139 mL/min/{1.73_m2} (ref >59)
Globulin, Total: 2.8 g/dL (ref 1.5–4.5)
Glucose: 99 mg/dL (ref 65–99)
Potassium: 4.7 mmol/L (ref 3.5–5.2)
Sodium: 140 mmol/L (ref 134–144)
Total Protein: 6.5 g/dL (ref 6.0–8.5)

## 2020-01-11 LAB — PROTEIN / CREATININE RATIO, URINE
Creatinine, Urine: 110.9 mg/dL
Protein, Ur: 13 mg/dL
Protein/Creat Ratio: 117 mg/g creat (ref 0–200)

## 2020-01-13 ENCOUNTER — Ambulatory Visit (INDEPENDENT_AMBULATORY_CARE_PROVIDER_SITE_OTHER): Payer: Medicaid Other

## 2020-01-13 ENCOUNTER — Other Ambulatory Visit: Payer: Self-pay

## 2020-01-13 VITALS — BP 115/73 | HR 89 | Wt 273.9 lb

## 2020-01-13 DIAGNOSIS — Z0130 Encounter for examination of blood pressure without abnormal findings: Secondary | ICD-10-CM

## 2020-01-13 NOTE — Progress Notes (Signed)
Pt here today for BP check s/p virtual visit with reported elevated BP.  Pt reports that she has a mild headache that she rated 4 on 0/10 pain scale.  Tylenol is effective for headache.  BP 115/73.  Reviewed results with Dr. Macon Large- no new orders.  Pt advised to bring her BP cuff to her appt scheduled on 01/24/20 to have cuff evaluated.  Pt verbalized understanding with no further questions.   Addison Naegeli, RN  01/13/20

## 2020-01-14 NOTE — Progress Notes (Signed)
Patient was assessed and managed by nursing staff during this encounter. I have reviewed the chart and agree with the documentation and plan. I have also made any necessary editorial changes.  Jaynie Collins, MD 01/14/2020 11:09 AM

## 2020-01-21 ENCOUNTER — Other Ambulatory Visit: Payer: Self-pay | Admitting: *Deleted

## 2020-01-21 DIAGNOSIS — O24419 Gestational diabetes mellitus in pregnancy, unspecified control: Secondary | ICD-10-CM

## 2020-01-22 ENCOUNTER — Other Ambulatory Visit: Payer: Self-pay

## 2020-01-24 ENCOUNTER — Telehealth (INDEPENDENT_AMBULATORY_CARE_PROVIDER_SITE_OTHER): Payer: Medicaid Other | Admitting: Obstetrics and Gynecology

## 2020-01-24 ENCOUNTER — Encounter: Payer: Self-pay | Admitting: Obstetrics and Gynecology

## 2020-01-24 VITALS — BP 135/79 | HR 98

## 2020-01-24 DIAGNOSIS — O0990 Supervision of high risk pregnancy, unspecified, unspecified trimester: Secondary | ICD-10-CM

## 2020-01-24 DIAGNOSIS — B009 Herpesviral infection, unspecified: Secondary | ICD-10-CM

## 2020-01-24 DIAGNOSIS — Z8759 Personal history of other complications of pregnancy, childbirth and the puerperium: Secondary | ICD-10-CM

## 2020-01-24 DIAGNOSIS — O24415 Gestational diabetes mellitus in pregnancy, controlled by oral hypoglycemic drugs: Secondary | ICD-10-CM

## 2020-01-24 DIAGNOSIS — O09293 Supervision of pregnancy with other poor reproductive or obstetric history, third trimester: Secondary | ICD-10-CM

## 2020-01-24 DIAGNOSIS — O98513 Other viral diseases complicating pregnancy, third trimester: Secondary | ICD-10-CM

## 2020-01-24 DIAGNOSIS — Z3A33 33 weeks gestation of pregnancy: Secondary | ICD-10-CM

## 2020-01-24 NOTE — Progress Notes (Signed)
   OBSTETRICS PRENATAL VIRTUAL VISIT ENCOUNTER NOTE  Provider location: Center for [facility]   I connected with [facility] on 01/24/20 at 10:35 AM EDT by MyChart Video Encounter at home and verified that I am speaking with the correct person using two identifiers.   I discussed the limitations, risks, security and privacy concerns of performing an evaluation and management service virtually and the availability of in person appointments. I also discussed with the patient that there may be a patient responsible charge related to this service. The patient expressed understanding and agreed to proceed. Subjective:  Judith Terry is a 24 y.o. G2P1001 at [redacted]w[redacted]d being seen today for ongoing prenatal care.  She is currently monitored for the following issues for this high-risk pregnancy and has GERD (gastroesophageal reflux disease); Tension headache; Muscle spasm; Herpes; Supervision of high risk pregnancy, antepartum; History of pre-eclampsia; Obesity in pregnancy; [redacted] weeks gestation of pregnancy; Nonintractable headache; Abnormal glucose tolerance test (GTT) during pregnancy, antepartum; and Gestational diabetes mellitus (GDM) treated with oral hypoglycemic therapy on their problem list.  Patient reports no complaints.  Contractions: Irritability. Vag. Bleeding: None.  Movement: Present. Denies any leaking of fluid.   The following portions of the patient's history were reviewed and updated as appropriate: allergies, current medications, past family history, past medical history, past social history, past surgical history and problem list.   Objective:   Vitals:   01/24/20 1038 01/24/20 1048  BP: (!) 153/84 135/79  Pulse: 90 98    Fetal Status:     Movement: Present     General:  Alert, oriented and cooperative. Patient is in no acute distress.  Respiratory: Normal respiratory effort, no problems with respiration noted  Mental Status: Normal mood and  affect. Normal behavior. Normal judgment and thought content.  Rest of physical exam deferred due to type of encounter  Imaging: No results found.  Assessment and Plan:  Pregnancy: G2P1001 at [redacted]w[redacted]d  1. Supervision of high risk pregnancy, antepartum - Pt virtual today as she has transportation issues - will have in person visit next week to check cuff - reviewed IUD, she will consider  2. History of pre-eclampsia BP mildly elevated today, will have come in next week to check cuff and BP Reviewed precautions  3. Herpes ppx 34 weeks  4. Gestational diabetes mellitus (GDM) treated with oral hypoglycemic therapy Needs to start antenatal testing On metformin 500 mg BID Reviewed CBGs from baby scripts, few are elevated    Preterm labor symptoms and general obstetric precautions including but not limited to vaginal bleeding, contractions, leaking of fluid and fetal movement were reviewed in detail with the patient. I discussed the assessment and treatment plan with the patient. The patient was provided an opportunity to ask questions and all were answered. The patient agreed with the plan and demonstrated an understanding of the instructions. The patient was advised to call back or seek an in-person office evaluation/go to [facility] at [facility] for any urgent or concerning symptoms. Please refer to After Visit Summary for other counseling recommendations.   I provided 15 minutes of face-to-face time during this encounter.  Return in about 1 week (around 01/31/2020) for high OB, in person.  Future Appointments  Date Time Provider Department Center  05/15/2020  2:20 PM Kallie Locks, FNP [facility] None    Conan Bowens, MD Center for [facility], [facility]

## 2020-01-24 NOTE — Progress Notes (Signed)
I connected with  [facility] on 01/24/20 at 10:35 AM EDT by MyChart and verified that I am speaking with the correct person using two identifiers.   I discussed the limitations, risks, security and privacy concerns of performing an evaluation and management service by telephone and the availability of in person appointments. I also discussed with the patient that there may be a patient responsible charge related to this service. The patient expressed understanding and agreed to proceed.      [name], RN 01/24/2020  10:35 AM

## 2020-01-27 ENCOUNTER — Telehealth: Payer: Self-pay

## 2020-01-27 MED FILL — ACCU-CHEK SOFTCLIX LANCETS: 30 days supply | Qty: 100 | Fill #1

## 2020-01-27 NOTE — Telephone Encounter (Signed)
Pt registered with [facility] non-emergency medical transportation. Transportation will call patient to let her know about the services and to have pt consent to rider waiver.

## 2020-01-29 ENCOUNTER — Other Ambulatory Visit: Payer: Self-pay

## 2020-01-29 ENCOUNTER — Ambulatory Visit: Payer: Medicaid Other | Attending: Obstetrics and Gynecology

## 2020-01-29 DIAGNOSIS — O99213 Obesity complicating pregnancy, third trimester: Secondary | ICD-10-CM

## 2020-01-29 DIAGNOSIS — Z148 Genetic carrier of other disease: Secondary | ICD-10-CM

## 2020-01-29 DIAGNOSIS — Z362 Encounter for other antenatal screening follow-up: Secondary | ICD-10-CM

## 2020-01-29 DIAGNOSIS — Z3A33 33 weeks gestation of pregnancy: Secondary | ICD-10-CM

## 2020-01-29 DIAGNOSIS — O09293 Supervision of pregnancy with other poor reproductive or obstetric history, third trimester: Secondary | ICD-10-CM | POA: Diagnosis not present

## 2020-01-29 DIAGNOSIS — O24415 Gestational diabetes mellitus in pregnancy, controlled by oral hypoglycemic drugs: Secondary | ICD-10-CM | POA: Diagnosis not present

## 2020-01-30 ENCOUNTER — Other Ambulatory Visit: Payer: Self-pay | Admitting: *Deleted

## 2020-01-30 DIAGNOSIS — O24415 Gestational diabetes mellitus in pregnancy, controlled by oral hypoglycemic drugs: Secondary | ICD-10-CM

## 2020-01-31 ENCOUNTER — Other Ambulatory Visit: Payer: Self-pay

## 2020-01-31 ENCOUNTER — Encounter (HOSPITAL_COMMUNITY): Payer: Self-pay | Admitting: Obstetrics and Gynecology

## 2020-01-31 ENCOUNTER — Inpatient Hospital Stay (HOSPITAL_COMMUNITY)
Admission: AD | Admit: 2020-01-31 | Discharge: 2020-01-31 | Disposition: A | Discharge status: Home / Self Care | Payer: Medicaid Other | Attending: Obstetrics and Gynecology | Admitting: Obstetrics and Gynecology

## 2020-01-31 ENCOUNTER — Encounter: Payer: Self-pay | Admitting: Obstetrics and Gynecology

## 2020-01-31 ENCOUNTER — Telehealth: Payer: Self-pay | Admitting: *Deleted

## 2020-01-31 DIAGNOSIS — Z841 Family history of disorders of kidney and ureter: Secondary | ICD-10-CM | POA: Diagnosis not present

## 2020-01-31 DIAGNOSIS — O99353 Diseases of the nervous system complicating pregnancy, third trimester: Secondary | ICD-10-CM | POA: Insufficient documentation

## 2020-01-31 DIAGNOSIS — Z833 Family history of diabetes mellitus: Secondary | ICD-10-CM | POA: Diagnosis not present

## 2020-01-31 DIAGNOSIS — Z8249 Family history of ischemic heart disease and other diseases of the circulatory system: Secondary | ICD-10-CM | POA: Insufficient documentation

## 2020-01-31 DIAGNOSIS — O99343 Other mental disorders complicating pregnancy, third trimester: Secondary | ICD-10-CM | POA: Diagnosis not present

## 2020-01-31 DIAGNOSIS — Z7982 Long term (current) use of aspirin: Secondary | ICD-10-CM | POA: Diagnosis not present

## 2020-01-31 DIAGNOSIS — O3660X0 Maternal care for excessive fetal growth, unspecified trimester, not applicable or unspecified: Secondary | ICD-10-CM | POA: Insufficient documentation

## 2020-01-31 DIAGNOSIS — Z79899 Other long term (current) drug therapy: Secondary | ICD-10-CM | POA: Insufficient documentation

## 2020-01-31 DIAGNOSIS — O0990 Supervision of high risk pregnancy, unspecified, unspecified trimester: Secondary | ICD-10-CM

## 2020-01-31 DIAGNOSIS — G43009 Migraine without aura, not intractable, without status migrainosus: Secondary | ICD-10-CM | POA: Diagnosis present

## 2020-01-31 DIAGNOSIS — Z3A34 34 weeks gestation of pregnancy: Secondary | ICD-10-CM | POA: Insufficient documentation

## 2020-01-31 DIAGNOSIS — F419 Anxiety disorder, unspecified: Secondary | ICD-10-CM | POA: Diagnosis not present

## 2020-01-31 DIAGNOSIS — R519 Headache, unspecified: Secondary | ICD-10-CM | POA: Diagnosis present

## 2020-01-31 LAB — URINALYSIS, ROUTINE W REFLEX MICROSCOPIC
Bilirubin Urine: NEGATIVE
Glucose, UA: NEGATIVE mg/dL
Hgb urine dipstick: NEGATIVE
Ketones, ur: 80 mg/dL — AB
Nitrite: NEGATIVE
Protein, ur: NEGATIVE mg/dL
Specific Gravity, Urine: 1.013 (ref 1.005–1.030)
pH: 5 (ref 5.0–8.0)

## 2020-01-31 LAB — COMPREHENSIVE METABOLIC PANEL
ALT: 29 U/L (ref 0–44)
AST: 38 U/L (ref 15–41)
Albumin: 2.7 g/dL — ABNORMAL LOW (ref 3.5–5.0)
Alkaline Phosphatase: 93 U/L (ref 38–126)
Anion gap: 11 (ref 5–15)
BUN: 6 mg/dL (ref 6–20)
CO2: 21 mmol/L — ABNORMAL LOW (ref 22–32)
Calcium: 8.9 mg/dL (ref 8.9–10.3)
Chloride: 106 mmol/L (ref 98–111)
Creatinine, Ser: 0.67 mg/dL (ref 0.44–1.00)
GFR calc Af Amer: >60 mL/min (ref >60)
GFR calc non Af Amer: >60 mL/min (ref >60)
Glucose, Bld: 74 mg/dL (ref 70–99)
Potassium: 4.5 mmol/L (ref 3.5–5.1)
Sodium: 138 mmol/L (ref 135–145)
Total Bilirubin: 0.6 mg/dL (ref 0.3–1.2)
Total Protein: 6.2 g/dL — ABNORMAL LOW (ref 6.5–8.1)

## 2020-01-31 LAB — CBC
HCT: 40.5 % (ref 36.0–46.0)
Hemoglobin: 12.8 g/dL (ref 12.0–15.0)
MCH: 32.6 pg (ref 26.0–34.0)
MCHC: 31.6 g/dL (ref 30.0–36.0)
MCV: 103.1 fL — ABNORMAL HIGH (ref 80.0–100.0)
Platelets: 166 10*3/uL (ref 150–400)
RBC: 3.93 MIL/uL (ref 3.87–5.11)
RDW: 13.8 % (ref 11.5–15.5)
WBC: 9.1 10*3/uL (ref 4.0–10.5)
nRBC: 0 % (ref 0.0–0.2)

## 2020-01-31 LAB — PROTEIN / CREATININE RATIO, URINE
Creatinine, Urine: 78.45 mg/dL
Protein Creatinine Ratio: 0.11 mg/mg{Cre} (ref 0.00–0.15)
Total Protein, Urine: 9 mg/dL

## 2020-01-31 MED ORDER — LACTATED RINGERS IV BOLUS
1000.0000 mL | Freq: Once | INTRAVENOUS | Status: AC
  Administered 2020-01-31: 1000 mL via INTRAVENOUS

## 2020-01-31 MED ORDER — DIPHENHYDRAMINE HCL 50 MG/ML IJ SOLN
25.0000 mg | Freq: Once | INTRAMUSCULAR | Status: AC
  Administered 2020-01-31: 25 mg via INTRAVENOUS
  Filled 2020-01-31: qty 1

## 2020-01-31 MED ORDER — DEXAMETHASONE SODIUM PHOSPHATE 10 MG/ML IJ SOLN
10.0000 mg | Freq: Once | INTRAMUSCULAR | Status: AC
  Administered 2020-01-31: 10 mg via INTRAVENOUS
  Filled 2020-01-31: qty 1

## 2020-01-31 MED ORDER — METOCLOPRAMIDE HCL 5 MG/ML IJ SOLN
10.0000 mg | Freq: Once | INTRAMUSCULAR | Status: AC
  Administered 2020-01-31: 10 mg via INTRAVENOUS
  Filled 2020-01-31: qty 2

## 2020-01-31 NOTE — Telephone Encounter (Signed)
Received message from [employer] regarding pt recording of elevated BP (154/85) yesterday. I called pt and discussed this concern. She stated that she took her BP several times yesterday, initially just for the weekly check and then due to it being elevated, took it several more times. All readings were elevated - 150's/80's.  She checked it 2 hours ago and was 155/85. Pt endorses having a headache now - pain scale 7. She denies having blurry vision, dizziness or seeing spots. I asked her to check BP during our call and she reported 161/103. I advised pt that she needs evaluation @ [facility]U. Pt stated that she lives in [place] and will need to wait until her mother gets off work @ 4:30. She will check with the [facility] transportation to see if they can pick her up today - otherwise will go later today.

## 2020-01-31 NOTE — MAU Note (Signed)
BP was real high. 140-150.  Was on phone with dr, was 161/103. So sent in for further eval. +HA (took some Tylenol, hasn't seemed to help), denies visual changes, epigastric pain, hands are a little puffy.

## 2020-01-31 NOTE — Discharge Instructions (Signed)
Migraine Headache A migraine headache is an intense, throbbing pain on one side or both sides of the head. Migraine headaches may also cause other symptoms, such as nausea, vomiting, and sensitivity to light and noise. A migraine headache can last from 4 hours to 3 days. Talk with your doctor about what things may bring on (trigger) your migraine headaches. What are the causes? The exact cause of this condition is not known. However, a migraine may be caused when nerves in the brain become irritated and release chemicals that cause inflammation of blood vessels. This inflammation causes pain. This condition may be triggered or caused by:  Drinking alcohol.  Smoking.  Taking medicines, such as: ? Medicine used to treat chest pain (nitroglycerin). ? Birth control pills. ? Estrogen. ? Certain blood pressure medicines.  Eating or drinking products that contain nitrates, glutamate, aspartame, or tyramine. Aged cheeses, chocolate, or caffeine may also be triggers.  Doing physical activity. Other things that may trigger a migraine headache include:  Menstruation.  Pregnancy.  Hunger.  Stress.  Lack of sleep or too much sleep.  Weather changes.  Fatigue. What increases the risk? The following factors may make you more likely to experience migraine headaches:  Being a certain age. This condition is more common in people who are 25-55 years old.  Being female.  Having a family history of migraine headaches.  Being Caucasian.  Having a mental health condition, such as depression or anxiety.  Being obese. What are the signs or symptoms? The main symptom of this condition is pulsating or throbbing pain. This pain may:  Happen in any area of the head, such as on one side or both sides.  Interfere with daily activities.  Get worse with physical activity.  Get worse with exposure to bright lights or loud noises. Other symptoms may  include:  Nausea.  Vomiting.  Dizziness.  General sensitivity to bright lights, loud noises, or smells. Before you get a migraine headache, you may get warning signs (an aura). An aura may include:  Seeing flashing lights or having blind spots.  Seeing bright spots, halos, or zigzag lines.  Having tunnel vision or blurred vision.  Having numbness or a tingling feeling.  Having trouble talking.  Having muscle weakness. Some people have symptoms after a migraine headache (postdromal phase), such as:  Feeling tired.  Difficulty concentrating. How is this diagnosed? A migraine headache can be diagnosed based on:  Your symptoms.  A physical exam.  Tests, such as: ? CT scan or an MRI of the head. These imaging tests can help rule out other causes of headaches. ? Taking fluid from the spine (lumbar puncture) and analyzing it (cerebrospinal fluid analysis, or CSF analysis). How is this treated? This condition may be treated with medicines that:  Relieve pain.  Relieve nausea.  Prevent migraine headaches. Treatment for this condition may also include:  Acupuncture.  Lifestyle changes like avoiding foods that trigger migraine headaches.  Biofeedback.  Cognitive behavioral therapy. Follow these instructions at home: Medicines  Take over-the-counter and prescription medicines only as told by your health care provider.  Ask your health care provider if the medicine prescribed to you: ? Requires you to avoid driving or using heavy machinery. ? Can cause constipation. You may need to take these actions to prevent or treat constipation:  Drink enough fluid to keep your urine pale yellow.  Take over-the-counter or prescription medicines.  Eat foods that are high in fiber, such as beans, whole grains, and   fresh fruits and vegetables.  Limit foods that are high in fat and processed sugars, such as fried or sweet foods. Lifestyle  Do not drink alcohol.  Do not  use any products that contain nicotine or tobacco, such as cigarettes, e-cigarettes, and chewing tobacco. If you need help quitting, ask your health care provider.  Get at least 8 hours of sleep every night.  Find ways to manage stress, such as meditation, deep breathing, or yoga. General instructions      Keep a journal to find out what may trigger your migraine headaches. For example, write down: ? What you eat and drink. ? How much sleep you get. ? Any change to your diet or medicines.  If you have a migraine headache: ? Avoid things that make your symptoms worse, such as bright lights. ? It may help to lie down in a dark, quiet room. ? Do not drive or use heavy machinery. ? Ask your health care provider what activities are safe for you while you are experiencing symptoms.  Keep all follow-up visits as told by your health care provider. This is important. Contact a health care provider if:  You develop symptoms that are different or more severe than your usual migraine headache symptoms.  You have more than 15 headache days in one month. Get help right away if:  Your migraine headache becomes severe.  Your migraine headache lasts longer than 72 hours.  You have a fever.  You have a stiff neck.  You have vision loss.  Your muscles feel weak or like you cannot control them.  You start to lose your balance often.  You have trouble walking.  You faint.  You have a seizure. Summary  A migraine headache is an intense, throbbing pain on one side or both sides of the head. Migraines may also cause other symptoms, such as nausea, vomiting, and sensitivity to light and noise.  This condition may be treated with medicines and lifestyle changes. You may also need to avoid certain things that trigger a migraine headache.  Keep a journal to find out what may trigger your migraine headaches.  Contact your health care provider if you have more than 15 headache days in a  month or you develop symptoms that are different or more severe than your usual migraine headache symptoms. This information is not intended to replace advice given to you by your health care provider. Make sure you discuss any questions you have with your health care provider. Document Revised: 12/14/2018 Document Reviewed: 10/04/2018 Elsevier Patient Education  2020 [employer] Inc.   Recurrent Migraine Headache  Migraines are a type of headache, and they are usually stronger and more sudden than normal headaches (tension headaches). Migraines are characterized by an intense pulsing, throbbing pain that is usually only present on one side of the head. Sometimes, migraine headaches can cause nausea, vomiting, sensitivity to light and sound, and vision changes. Recurrent migraines keep coming back (recurring). A migraine can last from 4 hours up to 3 days. What are the causes? The exact cause of this condition is not known. However, a migraine may be caused when nerves in the brain become irritated and release chemicals that cause inflammation of blood vessels. This inflammation causes pain. Certain things may also trigger migraines, such as:  A disruption in your regular eating and sleeping schedule.  Smoking.  Stress.  Menstruation.  Certain foods and drinks, such as: ? Aged cheese. ? Chocolate. ? Alcohol. ? Caffeine. ? Foods  or drinks that contain nitrates, glutamate, aspartame, MSG, or tyramine.  Lack of sleep.  Hunger.  Physical exertion.  Fatigue.  High altitude.  Weather changes.  Medicines, such as: ? Nitroglycerin, which is used to treat chest pain. ? Birth control pills. ? Estrogen. ? Some blood pressure medicines. What are the signs or symptoms? Symptoms of this condition vary for each person and may include:  Pain that is usually only present on one side of the head. In some cases, the pain may be on both sides of the head or around the head or  neck.  Pulsating or throbbing pain.  Severe pain that prevents daily activities.  Pain that is aggravated by any physical activity.  Nausea, vomiting, or both.  Dizziness.  Pain with exposure to bright lights, loud noises, or activity.  General sensitivity to bright lights, loud noises, or smells. Before you get a migraine, you may get warning signs that a migraine is coming (aura). An aura may include:  Seeing flashing lights.  Seeing bright spots, halos, or zigzag lines.  Having tunnel vision or blurred vision.  Having numbness or a tingling feeling.  Having trouble talking.  Having muscle weakness.  Smelling a certain odor. How is this diagnosed? This condition is often diagnosed based on:  Your symptoms and medical history.  A physical exam. You may also have tests, including:  A CT scan or MRI of your brain. These imaging tests cannot diagnose migraines, but they can help to rule out other causes of headaches.  Blood tests. How is this treated? This condition is treated with:  Medicines. These are used for: ? Lessening pain and nausea. ? Preventing recurrent migraines.  Lifestyle changes, such as changes to your diet or sleeping patterns.  Behavior therapy, such as relaxation training or biofeedback. Biofeedback is a treatment that involves teaching you to relax and use your brain to lower your heart rate and control your breathing. Follow these instructions at home: Medicines  Take over-the-counter and prescription medicines only as told by your health care provider.  Do not drive or use heavy machinery while taking prescription pain medicine. Lifestyle  Do not use any products that contain nicotine or tobacco, such as cigarettes and e-cigarettes. If you need help quitting, ask your health care provider.  Limit alcohol intake to no more than 1 drink a day for nonpregnant women and 2 drinks a day for men. One drink equals 12 oz of beer, 5 oz of wine,  or 1 oz of hard liquor.  Get 7-9 hours of sleep each night, or the amount of sleep recommended by your health care provider.  Limit your stress. Talk with your health care provider if you need help with stress management.  Maintain a healthy weight. If you need help losing weight, ask your health care provider.  Exercise regularly. Aim for 150 minutes of moderate-intensity exercise (walking, biking, yoga) or 75 minutes of vigorous exercise (running, circuit training, swimming) each week. General instructions   Keep a journal to find out what triggers your migraine headaches so you can avoid these triggers. For example, write down: ? What you eat and drink. ? How much sleep you get. ? Any change to your diet or medicines.  Lie down in a dark, quiet room when you have a migraine.  Try placing a cool towel over your head when you have a migraine.  Keep lights dim, if bright lights bother you and make your migraines worse.  Keep all follow-up  visits as told by your health care provider. This is important. Contact a health care provider if:  Your pain does not improve, even with medicine.  Your migraines continue to return, even with medicine.  You have a fever.  You have weight loss. Get help right away if:  Your migraine becomes severe and medicine does not help.  You have a stiff neck.  You have a loss of vision.  You have muscle weakness or loss of muscle control.  You start losing your balance or have trouble walking.  You feel faint or you pass out.  You develop new, severe symptoms.  You start having abrupt severe headaches that last for a second or less, like a thunderclap. Summary  Migraine headaches are usually stronger and more sudden than normal headaches (tension headaches). Migraines are characterized by an intense pulsing, throbbing pain that is usually only present on one side of the head.  The exact cause of this condition is not known. However, a  migraine may be caused when nerves in the brain become irritated and release chemicals that cause inflammation of blood vessels.  Certain things may trigger migraines, such as changes to diet or sleeping patterns, smoking, certain foods, alcohol, stress, and certain medicines.  Sometimes, migraine headaches can cause nausea, vomiting, sensitivity to light and sound, and vision changes.  Migraines are often diagnosed based on your symptoms, medical history, and a physical exam. This information is not intended to replace advice given to you by your health care provider. Make sure you discuss any questions you have with your health care provider. Document Revised: 08/25/2017 Document Reviewed: 06/03/2016 Elsevier Patient Education  2020 [employer].

## 2020-01-31 NOTE — MAU Provider Note (Signed)
History     CSN: [id number]  Arrival date and time: 01/31/20 1533   First Provider Initiated Contact with Patient 01/31/20 1646      Chief Complaint  Patient presents with  . Hypertension  . Headache   Ms.  Judith Terry is a 24 y.o. year old G43P1001 female at [redacted]w[redacted]d weeks gestation who presents to MAU reporting increased blood pressures at home.  She reported that her systolic blood pressures have been 140s-150s.  She reports that her blood pressure was 161/103 today while talking to me phone.  She also reports a headache that she took Tylenol for it did not help rating it a 7 out of 10 she denies any visual disturbances epigastric pain, but she does report some swelling in her hands.  She has a history of migraine headaches that have been prescribed Fioricet and Flexeril.  She last took Flexeril last night.   OB History    Gravida  2   Para  1   Term  1   Preterm      AB      Living  1     SAB      TAB      Ectopic      Multiple  0   Live Births  1           Past Medical History:  Diagnosis Date  . Anxiety   . Chronic back pain   . GERD (gastroesophageal reflux disease)   . Headache   . Herpes   . Infection    UTI  . Muscle spasm   . Pregnancy induced hypertension   . Trichomoniasis     Past Surgical History:  Procedure Laterality Date  . NO PAST SURGERIES      Family History  Problem Relation Age of Onset  . Diabetes Mother   . Diabetes Father   . Heart disease Father   . Kidney failure Father     Social History   Tobacco Use  . Smoking status: Never Smoker  . Smokeless tobacco: Never Used  Substance Use Topics  . Alcohol use: No  . Drug use: No    Allergies: No Known Allergies  Medications Prior to Admission  Medication Sig Dispense Refill Last Dose  . acetaminophen (TYLENOL) 500 MG tablet Take 500 mg by mouth every 6 (six) hours as needed for mild pain or fever.    01/31/2020 at Unknown time  . aspirin 81 MG chewable tablet  Chew 1 tablet (81 mg total) by mouth daily. 90 tablet 3 01/31/2020 at Unknown time  . busPIRone (BUSPAR) 15 MG tablet Take 1 tablet (15 mg total) by mouth 2 (two) times daily. 60 tablet 3 01/31/2020 at Unknown time  . Butalbital-APAP-Caffeine 50-325-40 MG capsule Take 1 capsule by mouth every 6 (six) hours as needed for headache. 30 capsule 0 Past Month at Unknown time  . calcium carbonate (TUMS - DOSED IN MG ELEMENTAL CALCIUM) 500 MG chewable tablet Chew 1 tablet by mouth daily.   Past Week at Unknown time  . cyclobenzaprine (FLEXERIL) 10 MG tablet Take 1 tablet (10 mg total) by mouth 3 (three) times daily as needed (headache, tension). 30 tablet 2 01/30/2020 at Unknown time  . omeprazole (PRILOSEC OTC) 20 MG tablet Take 1 tablet (20 mg total) by mouth daily. 30 tablet 3 Past Week at Unknown time  . prenatal vitamin w/FE, FA (PRENATAL 1 + 1) 27-1 MG TABS tablet Take 1 tablet by mouth daily  at 12 noon. 30 tablet 11 01/31/2020 at Unknown time  . promethazine (PHENERGAN) 25 MG tablet Take 1 tablet (25 mg total) by mouth every 6 (six) hours as needed for nausea or vomiting. 20 tablet 0 Past Week at Unknown time  . Accu-Chek Softclix Lancets lancets Gestational Diabetes Give enough for month Use as instructed 50 each 12   . glucose blood (ACCU-CHEK GUIDE) test strip Gestational Diabetes Give enough for month 50 each 12   . metFORMIN (GLUCOPHAGE) 500 MG tablet Take 1 tablet (500 mg total) by mouth 2 (two) times daily with a meal. 60 tablet 5   . valACYclovir (VALTREX) 500 MG tablet Take 1 tablet (500 mg total) by mouth 2 (two) times daily. (Patient not taking: Reported on 12/31/2019) 30 tablet 2 More than a month at Unknown time    Review of Systems  Constitutional: Negative.   HENT: Negative.   Eyes: Negative.   Respiratory: Negative.   Cardiovascular: Negative.   Gastrointestinal: Negative.   Endocrine: Negative.   Genitourinary: Negative.   Musculoskeletal: Negative.   Skin: Negative.    Allergic/Immunologic: Negative.   Neurological: Positive for headaches.  Hematological: Negative.   Psychiatric/Behavioral: Negative.    Physical Exam   Patient Vitals for the past 24 hrs:  BP Temp Temp src Pulse Resp SpO2 Height Weight  01/31/20 1831 (!) 128/42 -- -- 95 -- -- -- --  01/31/20 1816 133/66 -- -- 93 -- -- -- --  01/31/20 1801 134/69 -- -- 86 -- -- -- --  01/31/20 1746 125/72 -- -- (!) 109 -- -- -- --  01/31/20 1740 123/67 -- -- 93 -- -- -- --  01/31/20 1701 117/70 -- -- 99 -- -- -- --  01/31/20 1646 116/72 -- -- (!) 113 -- -- -- --  01/31/20 1631 116/75 -- -- 99 -- -- -- --  01/31/20 1616 123/75 -- -- (!) 101 -- -- -- --  01/31/20 1605 122/84 -- -- (!) 143 -- 97 % -- --  01/31/20 1547 138/84 98.3 F (36.8 C) Oral 95 20 98 % 5\' 9"  (1.753 m) 125.7 kg    Physical Exam  Nursing note and vitals reviewed. Constitutional: She is oriented to person, place, and time. She appears well-developed and well-nourished.  HENT:  Head: Normocephalic and atraumatic.  Eyes: Pupils are equal, round, and reactive to light.  Cardiovascular: Normal rate and regular rhythm.  Respiratory: Effort normal.  GI: Soft.  Genitourinary:    Genitourinary Comments: deferred   Musculoskeletal:        General: Normal range of motion.     Cervical back: Normal range of motion.  Neurological: She is alert and oriented to person, place, and time.  Skin: Skin is warm and dry.  Psychiatric: She has a normal mood and affect. Her behavior is normal. Judgment and thought content normal.   REACTIVE NST - FHR: 140 bpm / moderate variability / accels present / decels absent / TOCO: irregular UC's  MAU Course  Procedures  MDM CCUA CBC CMP P/C Ratio Serial BP's   Results for orders placed or performed during the hospital encounter of 01/31/20 (from the past 24 hour(s))  Urinalysis, Routine w reflex microscopic     Status: Abnormal   Collection Time: 01/31/20  3:52 PM  Result Value Ref Range    Color, Urine YELLOW YELLOW   APPearance HAZY (A) CLEAR   Specific Gravity, Urine 1.013 1.005 - 1.030   pH 5.0 5.0 - 8.0   Glucose,  UA NEGATIVE NEGATIVE mg/dL   Hgb urine dipstick NEGATIVE NEGATIVE   Bilirubin Urine NEGATIVE NEGATIVE   Ketones, ur 80 (A) NEGATIVE mg/dL   Protein, ur NEGATIVE NEGATIVE mg/dL   Nitrite NEGATIVE NEGATIVE   Leukocytes,Ua TRACE (A) NEGATIVE   RBC / HPF 0-5 0 - 5 RBC/hpf   WBC, UA 0-5 0 - 5 WBC/hpf   Bacteria, UA RARE (A) NONE SEEN   Squamous Epithelial / LPF 0-5 0 - 5   Mucus PRESENT   Protein / creatinine ratio, urine     Status: None   Collection Time: 01/31/20  4:42 PM  Result Value Ref Range   Creatinine, Urine 78.45 mg/dL   Total Protein, Urine 9 mg/dL   Protein Creatinine Ratio 0.11 0.00 - 0.15 mg/mg[Cre]  CBC     Status: Abnormal   Collection Time: 01/31/20  5:16 PM  Result Value Ref Range   WBC 9.1 4.0 - 10.5 K/uL   RBC 3.93 3.87 - 5.11 MIL/uL   Hemoglobin 12.8 12.0 - 15.0 g/dL   HCT 26.3 78.5 - 88.5 %   MCV 103.1 (H) 80.0 - 100.0 fL   MCH 32.6 26.0 - 34.0 pg   MCHC 31.6 30.0 - 36.0 g/dL   RDW 02.7 74.1 - 28.7 %   Platelets 166 150 - 400 K/uL   nRBC 0.0 0.0 - 0.2 %  Comprehensive metabolic panel     Status: Abnormal   Collection Time: 01/31/20  5:16 PM  Result Value Ref Range   Sodium 138 135 - 145 mmol/L   Potassium 4.5 3.5 - 5.1 mmol/L   Chloride 106 98 - 111 mmol/L   CO2 21 (L) 22 - 32 mmol/L   Glucose, Bld 74 70 - 99 mg/dL   BUN 6 6 - 20 mg/dL   Creatinine, Ser 8.67 0.44 - 1.00 mg/dL   Calcium 8.9 8.9 - 67.2 mg/dL   Total Protein 6.2 (L) 6.5 - 8.1 g/dL   Albumin 2.7 (L) 3.5 - 5.0 g/dL   AST 38 15 - 41 U/L   ALT 29 0 - 44 U/L   Alkaline Phosphatase 93 38 - 126 U/L   Total Bilirubin 0.6 0.3 - 1.2 mg/dL   GFR calc non Af Amer >60 >60 mL/min   GFR calc Af Amer >60 >60 mL/min   Anion gap 11 5 - 15     Received migraine headache cocktail (IVFs with benadryl 25 mg IVP, metoclopramide 10 mg IVP, Decadron 10 mg IVP) -- reports  relief. Rating the pain 2/10.   Assessment and Plan  Migraine without aura and without status migrainosus, not intractable  - Advised to continue the Fioricet and Flexeril as previously prescribed - Information provided on migraine  - Discharge patient - Patient verbalized an understanding of the plan of care and agrees.     Raelyn Mora, MSN, CNM 01/31/2020, 4:53 PM

## 2020-02-04 ENCOUNTER — Encounter: Payer: Medicaid Other | Admitting: Obstetrics and Gynecology

## 2020-02-04 ENCOUNTER — Ambulatory Visit (INDEPENDENT_AMBULATORY_CARE_PROVIDER_SITE_OTHER): Payer: Medicaid Other | Admitting: Obstetrics and Gynecology

## 2020-02-04 ENCOUNTER — Other Ambulatory Visit: Payer: Self-pay

## 2020-02-04 ENCOUNTER — Encounter: Payer: Self-pay | Admitting: Obstetrics and Gynecology

## 2020-02-04 ENCOUNTER — Other Ambulatory Visit: Payer: Self-pay | Admitting: Obstetrics and Gynecology

## 2020-02-04 ENCOUNTER — Other Ambulatory Visit: Payer: Self-pay | Admitting: Family Medicine

## 2020-02-04 VITALS — BP 125/85 | HR 102 | Wt 277.2 lb

## 2020-02-04 DIAGNOSIS — B009 Herpesviral infection, unspecified: Secondary | ICD-10-CM

## 2020-02-04 DIAGNOSIS — Z3A34 34 weeks gestation of pregnancy: Secondary | ICD-10-CM

## 2020-02-04 DIAGNOSIS — O0993 Supervision of high risk pregnancy, unspecified, third trimester: Secondary | ICD-10-CM

## 2020-02-04 DIAGNOSIS — O3663X1 Maternal care for excessive fetal growth, third trimester, fetus 1: Secondary | ICD-10-CM

## 2020-02-04 DIAGNOSIS — O98313 Other infections with a predominantly sexual mode of transmission complicating pregnancy, third trimester: Secondary | ICD-10-CM

## 2020-02-04 DIAGNOSIS — Z8759 Personal history of other complications of pregnancy, childbirth and the puerperium: Secondary | ICD-10-CM

## 2020-02-04 DIAGNOSIS — O3663X0 Maternal care for excessive fetal growth, third trimester, not applicable or unspecified: Secondary | ICD-10-CM

## 2020-02-04 DIAGNOSIS — O0990 Supervision of high risk pregnancy, unspecified, unspecified trimester: Secondary | ICD-10-CM

## 2020-02-04 DIAGNOSIS — O24415 Gestational diabetes mellitus in pregnancy, controlled by oral hypoglycemic drugs: Secondary | ICD-10-CM

## 2020-02-04 DIAGNOSIS — F411 Generalized anxiety disorder: Secondary | ICD-10-CM

## 2020-02-04 LAB — POCT URINALYSIS DIP (DEVICE)
Bilirubin Urine: NEGATIVE
Glucose, UA: NEGATIVE mg/dL
Hgb urine dipstick: NEGATIVE
Ketones, ur: NEGATIVE mg/dL
Nitrite: NEGATIVE
Protein, ur: NEGATIVE mg/dL
Specific Gravity, Urine: 1.02 (ref 1.005–1.030)
Urobilinogen, UA: 0.2 mg/dL (ref 0.0–1.0)
pH: 6 (ref 5.0–8.0)

## 2020-02-04 MED ORDER — VALACYCLOVIR HCL 500 MG PO TABS: 500.0000 mg | ORAL_TABLET | Freq: Two times a day (BID) | ORAL | 6 refills | Status: DC

## 2020-02-04 MED FILL — VALACYCLOVIR HCL 500 MG TAB: 500 | 30 days supply | Qty: 60 | Fill #0

## 2020-02-04 MED FILL — METFORMIN HCL 500 MG TABS: 500 | 30 days supply | Qty: 60 | Fill #1

## 2020-02-04 NOTE — Progress Notes (Signed)
Subjective:  Judith Terry is a 24 y.o. G2P1001 at [redacted]w[redacted]d being seen today for ongoing prenatal care.  She is currently monitored for the following issues for this high-risk pregnancy and has GERD (gastroesophageal reflux disease); Tension headache; Muscle spasm; Herpes; Supervision of high risk pregnancy, antepartum; History of pre-eclampsia; Obesity in pregnancy; Nonintractable headache; Gestational diabetes mellitus (GDM) treated with oral hypoglycemic therapy; LGA (large for gestational age) fetus affecting management of mother; and Migraine without aura and without status migrainosus, not intractable on their problem list.  Patient reports general discomforts of pregnancy.  Contractions: Irritability. Vag. Bleeding: None.  Movement: Present. Denies leaking of fluid.   The following portions of the patient's history were reviewed and updated as appropriate: allergies, current medications, past family history, past medical history, past social history, past surgical history and problem list. Problem list updated.  Objective:   Vitals:   02/04/20 1100  BP: 125/85  Pulse: (!) 102  Weight: 277 lb 3.2 oz (125.7 kg)    Fetal Status: Fetal Heart Rate (bpm): 150   Movement: Present     General:  Alert, oriented and cooperative. Patient is in no acute distress.  Skin: Skin is warm and dry. No rash noted.   Cardiovascular: Normal heart rate noted  Respiratory: Normal respiratory effort, no problems with respiration noted  Abdomen: Soft, gravid, appropriate for gestational age. Pain/Pressure: Absent     Pelvic:  Cervical exam deferred        Extremities: Normal range of motion.  Edema: Moderate pitting, indentation subsides rapidly  Mental Status: Normal mood and affect. Normal behavior. Normal judgment and thought content.   Urinalysis:      Assessment and Plan:  Pregnancy: G2P1001 at [redacted]w[redacted]d  1. Supervision of high risk pregnancy, antepartum Stable GBS next visit  2. Gestational  diabetes mellitus (GDM) treated with oral hypoglycemic therapy CBG's in goal range Does not eat breakfast so does not check 2 PP afterwards Diet reviewed Growth 96% 01/29/20, F/U scheudled Continue with Metformin  3. Excessive fetal growth affecting management of pregnancy in third trimester, fetus 1 of multiple gestation See above  4. History of pre-eclampsia BP stable No meds  5. Herpes  - valACYclovir (VALTREX) 500 MG tablet; Take 1 tablet (500 mg total) by mouth 2 (two) times daily.  Dispense: 60 tablet; Refill: 6  Preterm labor symptoms and general obstetric precautions including but not limited to vaginal bleeding, contractions, leaking of fluid and fetal movement were reviewed in detail with the patient. Please refer to After Visit Summary for other counseling recommendations.  Return in about 2 weeks (around 02/18/2020) for face to face, MD only.   Hermina Staggers, MD

## 2020-02-04 NOTE — Progress Notes (Signed)
     Valisha Heslin RN 02/04/20 

## 2020-02-04 NOTE — Progress Notes (Signed)
Pt brought home BP cuff to visit today. BP cuff not accurate; reading higher by 20-30 points systolic. Attempted taking on forearm as cuff appears to be too small, also inaccurate. Instructed pt to return to [facility] for assistance.   Fleet Contras RN 02/04/20

## 2020-02-04 NOTE — Patient Instructions (Signed)
Third Trimester of Pregnancy The third trimester is from week 28 through week 40 (months 7 through 9). The third trimester is a time when the unborn baby (fetus) is growing rapidly. At the end of the ninth month, the fetus is about 20 inches in length and weighs 6-10 pounds. Body changes during your third trimester Your body will continue to go through many changes during pregnancy. The changes vary from woman to woman. During the third trimester:  Your weight will continue to increase. You can expect to gain 25-35 pounds (11-16 kg) by the end of the pregnancy.  You may begin to get stretch marks on your hips, abdomen, and breasts.  You may urinate more often because the fetus is moving lower into your pelvis and pressing on your bladder.  You may develop or continue to have heartburn. This is caused by increased hormones that slow down muscles in the digestive tract.  You may develop or continue to have constipation because increased hormones slow digestion and cause the muscles that push waste through your intestines to relax.  You may develop hemorrhoids. These are swollen veins (varicose veins) in the rectum that can itch or be painful.  You may develop swollen, bulging veins (varicose veins) in your legs.  You may have increased body aches in the pelvis, back, or thighs. This is due to weight gain and increased hormones that are relaxing your joints.  You may have changes in your hair. These can include thickening of your hair, rapid growth, and changes in texture. Some women also have hair loss during or after pregnancy, or hair that feels dry or thin. Your hair will most likely return to normal after your baby is born.  Your breasts will continue to grow and they will continue to become tender. A yellow fluid (colostrum) may leak from your breasts. This is the first milk you are producing for your baby.  Your belly button may stick out.  You may notice more swelling in your hands,  face, or ankles.  You may have increased tingling or numbness in your hands, arms, and legs. The skin on your belly may also feel numb.  You may feel short of breath because of your expanding uterus.  You may have more problems sleeping. This can be caused by the size of your belly, increased need to urinate, and an increase in your body's metabolism.  You may notice the fetus "dropping," or moving lower in your abdomen (lightening).  You may have increased vaginal discharge.  You may notice your joints feel loose and you may have pain around your pelvic bone. What to expect at prenatal visits You will have prenatal exams every 2 weeks until week 36. Then you will have weekly prenatal exams. During a routine prenatal visit:  You will be weighed to make sure you and the baby are growing normally.  Your blood pressure will be taken.  Your abdomen will be measured to track your baby's growth.  The fetal heartbeat will be listened to.  Any test results from the previous visit will be discussed.  You may have a cervical check near your due date to see if your cervix has softened or thinned (effaced).  You will be tested for Group B streptococcus. This happens between 35 and 37 weeks. Your health care provider may ask you:  What your birth plan is.  How you are feeling.  If you are feeling the baby move.  If you have had any abnormal   symptoms, such as leaking fluid, bleeding, severe headaches, or abdominal cramping.  If you are using any tobacco products, including cigarettes, chewing tobacco, and electronic cigarettes.  If you have any questions. Other tests or screenings that may be performed during your third trimester include:  Blood tests that check for low iron levels (anemia).  Fetal testing to check the health, activity level, and growth of the fetus. Testing is done if you have certain medical conditions or if there are problems during the pregnancy.  Nonstress test  (NST). This test checks the health of your baby to make sure there are no signs of problems, such as the baby not getting enough oxygen. During this test, a belt is placed around your belly. The baby is made to move, and its heart rate is monitored during movement. What is false labor? False labor is a condition in which you feel small, irregular tightenings of the muscles in the womb (contractions) that usually go away with rest, changing position, or drinking water. These are called Braxton Hicks contractions. Contractions may last for hours, days, or even weeks before true labor sets in. If contractions come at regular intervals, become more frequent, increase in intensity, or become painful, you should see your health care provider. What are the signs of labor?  Abdominal cramps.  Regular contractions that start at 10 minutes apart and become stronger and more frequent with time.  Contractions that start on the top of the uterus and spread down to the lower abdomen and back.  Increased pelvic pressure and dull back pain.  A watery or bloody mucus discharge that comes from the vagina.  Leaking of amniotic fluid. This is also known as your "water breaking." It could be a slow trickle or a gush. Let your health care provider know if it has a color or strange odor. If you have any of these signs, call your health care provider right away, even if it is before your due date. Follow these instructions at home: Medicines  Follow your health care provider's instructions regarding medicine use. Specific medicines may be either safe or unsafe to take during pregnancy.  Take a prenatal vitamin that contains at least 600 micrograms (mcg) of folic acid.  If you develop constipation, try taking a stool softener if your health care provider approves. Eating and drinking   Eat a balanced diet that includes fresh fruits and vegetables, whole grains, good sources of protein such as meat, eggs, or tofu,  and low-fat dairy. Your health care provider will help you determine the amount of weight gain that is right for you.  Avoid raw meat and uncooked cheese. These carry germs that can cause birth defects in the baby.  If you have low calcium intake from food, talk to your health care provider about whether you should take a daily calcium supplement.  Eat four or five small meals rather than three large meals a day.  Limit foods that are high in fat and processed sugars, such as fried and sweet foods.  To prevent constipation: ? Drink enough fluid to keep your urine clear or pale yellow. ? Eat foods that are high in fiber, such as fresh fruits and vegetables, whole grains, and beans. Activity  Exercise only as directed by your health care provider. Most women can continue their usual exercise routine during pregnancy. Try to exercise for 30 minutes at least 5 days a week. Stop exercising if you experience uterine contractions.  Avoid heavy lifting.  Do   not exercise in extreme heat or humidity, or at high altitudes.  Wear low-heel, comfortable shoes.  Practice good posture.  You may continue to have sex unless your health care provider tells you otherwise. Relieving pain and discomfort  Take frequent breaks and rest with your legs elevated if you have leg cramps or low back pain.  Take warm sitz baths to soothe any pain or discomfort caused by hemorrhoids. Use hemorrhoid cream if your health care provider approves.  Wear a good support bra to prevent discomfort from breast tenderness.  If you develop varicose veins: ? Wear support pantyhose or compression stockings as told by your healthcare provider. ? Elevate your feet for 15 minutes, 3-4 times a day. Prenatal care  Write down your questions. Take them to your prenatal visits.  Keep all your prenatal visits as told by your health care provider. This is important. Safety  Wear your seat belt at all times when driving.  Make  a list of emergency phone numbers, including numbers for family, friends, the hospital, and police and fire departments. General instructions  Avoid cat litter boxes and soil used by cats. These carry germs that can cause birth defects in the baby. If you have a cat, ask someone to clean the litter box for you.  Do not travel far distances unless it is absolutely necessary and only with the approval of your health care provider.  Do not use hot tubs, steam rooms, or saunas.  Do not drink alcohol.  Do not use any products that contain nicotine or tobacco, such as cigarettes and e-cigarettes. If you need help quitting, ask your health care provider.  Do not use any medicinal herbs or unprescribed drugs. These chemicals affect the formation and growth of the baby.  Do not douche or use tampons or scented sanitary pads.  Do not cross your legs for long periods of time.  To prepare for the arrival of your baby: ? Take prenatal classes to understand, practice, and ask questions about labor and delivery. ? Make a trial run to the hospital. ? Visit the hospital and tour the maternity area. ? Arrange for maternity or paternity leave through employers. ? Arrange for family and friends to take care of pets while you are in the hospital. ? Purchase a rear-facing car seat and make sure you know how to install it in your car. ? Pack your hospital bag. ? Prepare the baby's nursery. Make sure to remove all pillows and stuffed animals from the baby's crib to prevent suffocation.  Visit your dentist if you have not gone during your pregnancy. Use a soft toothbrush to brush your teeth and be gentle when you floss. Contact a health care provider if:  You are unsure if you are in labor or if your water has broken.  You become dizzy.  You have mild pelvic cramps, pelvic pressure, or nagging pain in your abdominal area.  You have lower back pain.  You have persistent nausea, vomiting, or  diarrhea.  You have an unusual or bad smelling vaginal discharge.  You have pain when you urinate. Get help right away if:  Your water breaks before 37 weeks.  You have regular contractions less than 5 minutes apart before 37 weeks.  You have a fever.  You are leaking fluid from your vagina.  You have spotting or bleeding from your vagina.  You have severe abdominal pain or cramping.  You have rapid weight loss or weight gain.  You have   shortness of breath with chest pain.  You notice sudden or extreme swelling of your face, hands, ankles, feet, or legs.  Your baby makes fewer than 10 movements in 2 hours.  You have severe headaches that do not go away when you take medicine.  You have vision changes. Summary  The third trimester is from week 28 through week 40, months 7 through 9. The third trimester is a time when the unborn baby (fetus) is growing rapidly.  During the third trimester, your discomfort may increase as you and your baby continue to gain weight. You may have abdominal, leg, and back pain, sleeping problems, and an increased need to urinate.  During the third trimester your breasts will keep growing and they will continue to become tender. A yellow fluid (colostrum) may leak from your breasts. This is the first milk you are producing for your baby.  False labor is a condition in which you feel small, irregular tightenings of the muscles in the womb (contractions) that eventually go away. These are called Braxton Hicks contractions. Contractions may last for hours, days, or even weeks before true labor sets in.  Signs of labor can include: abdominal cramps; regular contractions that start at 10 minutes apart and become stronger and more frequent with time; watery or bloody mucus discharge that comes from the vagina; increased pelvic pressure and dull back pain; and leaking of amniotic fluid. This information is not intended to replace advice given to you by your  health care provider. Make sure you discuss any questions you have with your health care provider. Document Revised: 12/13/2018 Document Reviewed: 09/27/2016 [employer]vier Patient Education  2020 [employer]  

## 2020-02-05 NOTE — Telephone Encounter (Signed)
Refill request for Buspar

## 2020-02-06 ENCOUNTER — Other Ambulatory Visit: Payer: Self-pay | Admitting: Family Medicine

## 2020-02-06 DIAGNOSIS — F411 Generalized anxiety disorder: Secondary | ICD-10-CM

## 2020-02-11 ENCOUNTER — Encounter: Payer: Self-pay | Admitting: Family Medicine

## 2020-02-18 ENCOUNTER — Encounter: Payer: Self-pay | Admitting: *Deleted

## 2020-02-18 ENCOUNTER — Other Ambulatory Visit: Payer: Self-pay

## 2020-02-18 ENCOUNTER — Ambulatory Visit (INDEPENDENT_AMBULATORY_CARE_PROVIDER_SITE_OTHER): Payer: Medicaid Other | Admitting: Obstetrics and Gynecology

## 2020-02-18 ENCOUNTER — Encounter: Payer: Self-pay | Admitting: Obstetrics and Gynecology

## 2020-02-18 ENCOUNTER — Other Ambulatory Visit (HOSPITAL_COMMUNITY)
Admission: RE | Admit: 2020-02-18 | Discharge: 2020-02-18 | Disposition: A | Discharge status: Home / Self Care | Payer: Medicaid Other | Source: Ambulatory Visit | Attending: Obstetrics and Gynecology | Admitting: Obstetrics and Gynecology

## 2020-02-18 VITALS — BP 138/83 | HR 85 | Wt 290.5 lb

## 2020-02-18 DIAGNOSIS — O0990 Supervision of high risk pregnancy, unspecified, unspecified trimester: Secondary | ICD-10-CM

## 2020-02-18 DIAGNOSIS — O24415 Gestational diabetes mellitus in pregnancy, controlled by oral hypoglycemic drugs: Secondary | ICD-10-CM

## 2020-02-18 DIAGNOSIS — O3663X0 Maternal care for excessive fetal growth, third trimester, not applicable or unspecified: Secondary | ICD-10-CM

## 2020-02-18 DIAGNOSIS — O3663X1 Maternal care for excessive fetal growth, third trimester, fetus 1: Secondary | ICD-10-CM

## 2020-02-18 DIAGNOSIS — O0993 Supervision of high risk pregnancy, unspecified, third trimester: Secondary | ICD-10-CM

## 2020-02-18 DIAGNOSIS — O98313 Other infections with a predominantly sexual mode of transmission complicating pregnancy, third trimester: Secondary | ICD-10-CM

## 2020-02-18 DIAGNOSIS — Z3A36 36 weeks gestation of pregnancy: Secondary | ICD-10-CM

## 2020-02-18 DIAGNOSIS — A6000 Herpesviral infection of urogenital system, unspecified: Secondary | ICD-10-CM

## 2020-02-18 DIAGNOSIS — B009 Herpesviral infection, unspecified: Secondary | ICD-10-CM

## 2020-02-18 DIAGNOSIS — Z8759 Personal history of other complications of pregnancy, childbirth and the puerperium: Secondary | ICD-10-CM

## 2020-02-18 LAB — POCT URINALYSIS DIP (DEVICE)
Bilirubin Urine: NEGATIVE
Glucose, UA: NEGATIVE mg/dL
Hgb urine dipstick: NEGATIVE
Ketones, ur: NEGATIVE mg/dL
Nitrite: NEGATIVE
Protein, ur: 30 mg/dL — AB
Specific Gravity, Urine: >=1.03 (ref 1.005–1.030)
Urobilinogen, UA: 0.2 mg/dL (ref 0.0–1.0)
pH: 6 (ref 5.0–8.0)

## 2020-02-18 NOTE — Progress Notes (Signed)
Subjective:  Judith Terry is a 24 y.o. G2P1001 at [redacted]w[redacted]d being seen today for ongoing prenatal care.  She is currently monitored for the following issues for this high-risk pregnancy and has GERD (gastroesophageal reflux disease); Herpes; Supervision of high risk pregnancy, antepartum; History of pre-eclampsia; Obesity in pregnancy; Gestational diabetes mellitus (GDM) treated with oral hypoglycemic therapy; LGA (large for gestational age) fetus affecting management of mother; and Migraine without aura and without status migrainosus, not intractable on their problem list.  Patient reports general discomforts of pregnancy.  Contractions: Not present. Vag. Bleeding: None.  Movement: Present. Denies leaking of fluid.   The following portions of the patient's history were reviewed and updated as appropriate: allergies, current medications, past family history, past medical history, past social history, past surgical history and problem list. Problem list updated.  Objective:   Vitals:   02/18/20 0831  BP: 138/83  Pulse: 85  Weight: 290 lb 8 oz (131.8 kg)    Fetal Status: Fetal Heart Rate (bpm): 144   Movement: Present     General:  Alert, oriented and cooperative. Patient is in no acute distress.  Skin: Skin is warm and dry. No rash noted.   Cardiovascular: Normal heart rate noted  Respiratory: Normal respiratory effort, no problems with respiration noted  Abdomen: Soft, gravid, appropriate for gestational age. Pain/Pressure: Present     Pelvic:  Cervical exam performed        Extremities: Normal range of motion.  Edema: Moderate pitting, indentation subsides rapidly  Mental Status: Normal mood and affect. Normal behavior. Normal judgment and thought content.   Urinalysis:      Assessment and Plan:  Pregnancy: G2P1001 at [redacted]w[redacted]d  1. Gestational diabetes mellitus (GDM) treated with oral hypoglycemic therapy CBG's in goal range for the most part. A few outliers related to diet  choices. Continue with Metformin Growth scan this Friday  2. Supervision of high risk pregnancy, antepartum Labor precautions - GC/Chlamydia probe amp ([place])not at [facility] - Culture, beta strep (group b only)  3. History of pre-eclampsia BP stable  4. Herpes Continue with supression  5. Excessive fetal growth affecting management of pregnancy in third trimester, fetus 1 of multiple gestation Growth scan this Friday  Term labor symptoms and general obstetric precautions including but not limited to vaginal bleeding, contractions, leaking of fluid and fetal movement were reviewed in detail with the patient. Please refer to After Visit Summary for other counseling recommendations.  Return in about 1 week (around 02/25/2020) for OB visit, face to face, MD only.   Hermina Staggers, MD

## 2020-02-18 NOTE — Patient Instructions (Signed)
Third Trimester of Pregnancy The third trimester is from week 28 through week 40 (months 7 through 9). The third trimester is a time when the unborn baby (fetus) is growing rapidly. At the end of the ninth month, the fetus is about 20 inches in length and weighs 6-10 pounds. Body changes during your third trimester Your body will continue to go through many changes during pregnancy. The changes vary from woman to woman. During the third trimester:  Your weight will continue to increase. You can expect to gain 25-35 pounds (11-16 kg) by the end of the pregnancy.  You may begin to get stretch marks on your hips, abdomen, and breasts.  You may urinate more often because the fetus is moving lower into your pelvis and pressing on your bladder.  You may develop or continue to have heartburn. This is caused by increased hormones that slow down muscles in the digestive tract.  You may develop or continue to have constipation because increased hormones slow digestion and cause the muscles that push waste through your intestines to relax.  You may develop hemorrhoids. These are swollen veins (varicose veins) in the rectum that can itch or be painful.  You may develop swollen, bulging veins (varicose veins) in your legs.  You may have increased body aches in the pelvis, back, or thighs. This is due to weight gain and increased hormones that are relaxing your joints.  You may have changes in your hair. These can include thickening of your hair, rapid growth, and changes in texture. Some women also have hair loss during or after pregnancy, or hair that feels dry or thin. Your hair will most likely return to normal after your baby is born.  Your breasts will continue to grow and they will continue to become tender. A yellow fluid (colostrum) may leak from your breasts. This is the first milk you are producing for your baby.  Your belly button may stick out.  You may notice more swelling in your hands,  face, or ankles.  You may have increased tingling or numbness in your hands, arms, and legs. The skin on your belly may also feel numb.  You may feel short of breath because of your expanding uterus.  You may have more problems sleeping. This can be caused by the size of your belly, increased need to urinate, and an increase in your body's metabolism.  You may notice the fetus "dropping," or moving lower in your abdomen (lightening).  You may have increased vaginal discharge.  You may notice your joints feel loose and you may have pain around your pelvic bone. What to expect at prenatal visits You will have prenatal exams every 2 weeks until week 36. Then you will have weekly prenatal exams. During a routine prenatal visit:  You will be weighed to make sure you and the baby are growing normally.  Your blood pressure will be taken.  Your abdomen will be measured to track your baby's growth.  The fetal heartbeat will be listened to.  Any test results from the previous visit will be discussed.  You may have a cervical check near your due date to see if your cervix has softened or thinned (effaced).  You will be tested for Group B streptococcus. This happens between 35 and 37 weeks. Your health care provider may ask you:  What your birth plan is.  How you are feeling.  If you are feeling the baby move.  If you have had any abnormal   symptoms, such as leaking fluid, bleeding, severe headaches, or abdominal cramping.  If you are using any tobacco products, including cigarettes, chewing tobacco, and electronic cigarettes.  If you have any questions. Other tests or screenings that may be performed during your third trimester include:  Blood tests that check for low iron levels (anemia).  Fetal testing to check the health, activity level, and growth of the fetus. Testing is done if you have certain medical conditions or if there are problems during the pregnancy.  Nonstress test  (NST). This test checks the health of your baby to make sure there are no signs of problems, such as the baby not getting enough oxygen. During this test, a belt is placed around your belly. The baby is made to move, and its heart rate is monitored during movement. What is false labor? False labor is a condition in which you feel small, irregular tightenings of the muscles in the womb (contractions) that usually go away with rest, changing position, or drinking water. These are called Braxton Hicks contractions. Contractions may last for hours, days, or even weeks before true labor sets in. If contractions come at regular intervals, become more frequent, increase in intensity, or become painful, you should see your health care provider. What are the signs of labor?  Abdominal cramps.  Regular contractions that start at 10 minutes apart and become stronger and more frequent with time.  Contractions that start on the top of the uterus and spread down to the lower abdomen and back.  Increased pelvic pressure and dull back pain.  A watery or bloody mucus discharge that comes from the vagina.  Leaking of amniotic fluid. This is also known as your "water breaking." It could be a slow trickle or a gush. Let your health care provider know if it has a color or strange odor. If you have any of these signs, call your health care provider right away, even if it is before your due date. Follow these instructions at home: Medicines  Follow your health care provider's instructions regarding medicine use. Specific medicines may be either safe or unsafe to take during pregnancy.  Take a prenatal vitamin that contains at least 600 micrograms (mcg) of folic acid.  If you develop constipation, try taking a stool softener if your health care provider approves. Eating and drinking   Eat a balanced diet that includes fresh fruits and vegetables, whole grains, good sources of protein such as meat, eggs, or tofu,  and low-fat dairy. Your health care provider will help you determine the amount of weight gain that is right for you.  Avoid raw meat and uncooked cheese. These carry germs that can cause birth defects in the baby.  If you have low calcium intake from food, talk to your health care provider about whether you should take a daily calcium supplement.  Eat four or five small meals rather than three large meals a day.  Limit foods that are high in fat and processed sugars, such as fried and sweet foods.  To prevent constipation: ? Drink enough fluid to keep your urine clear or pale yellow. ? Eat foods that are high in fiber, such as fresh fruits and vegetables, whole grains, and beans. Activity  Exercise only as directed by your health care provider. Most women can continue their usual exercise routine during pregnancy. Try to exercise for 30 minutes at least 5 days a week. Stop exercising if you experience uterine contractions.  Avoid heavy lifting.  Do   not exercise in extreme heat or humidity, or at high altitudes.  Wear low-heel, comfortable shoes.  Practice good posture.  You may continue to have sex unless your health care provider tells you otherwise. Relieving pain and discomfort  Take frequent breaks and rest with your legs elevated if you have leg cramps or low back pain.  Take warm sitz baths to soothe any pain or discomfort caused by hemorrhoids. Use hemorrhoid cream if your health care provider approves.  Wear a good support bra to prevent discomfort from breast tenderness.  If you develop varicose veins: ? Wear support pantyhose or compression stockings as told by your healthcare provider. ? Elevate your feet for 15 minutes, 3-4 times a day. Prenatal care  Write down your questions. Take them to your prenatal visits.  Keep all your prenatal visits as told by your health care provider. This is important. Safety  Wear your seat belt at all times when driving.  Make  a list of emergency phone numbers, including numbers for family, friends, the hospital, and police and fire departments. General instructions  Avoid cat litter boxes and soil used by cats. These carry germs that can cause birth defects in the baby. If you have a cat, ask someone to clean the litter box for you.  Do not travel far distances unless it is absolutely necessary and only with the approval of your health care provider.  Do not use hot tubs, steam rooms, or saunas.  Do not drink alcohol.  Do not use any products that contain nicotine or tobacco, such as cigarettes and e-cigarettes. If you need help quitting, ask your health care provider.  Do not use any medicinal herbs or unprescribed drugs. These chemicals affect the formation and growth of the baby.  Do not douche or use tampons or scented sanitary pads.  Do not cross your legs for long periods of time.  To prepare for the arrival of your baby: ? Take prenatal classes to understand, practice, and ask questions about labor and delivery. ? Make a trial run to the hospital. ? Visit the hospital and tour the maternity area. ? Arrange for maternity or paternity leave through employers. ? Arrange for family and friends to take care of pets while you are in the hospital. ? Purchase a rear-facing car seat and make sure you know how to install it in your car. ? Pack your hospital bag. ? Prepare the baby's nursery. Make sure to remove all pillows and stuffed animals from the baby's crib to prevent suffocation.  Visit your dentist if you have not gone during your pregnancy. Use a soft toothbrush to brush your teeth and be gentle when you floss. Contact a health care provider if:  You are unsure if you are in labor or if your water has broken.  You become dizzy.  You have mild pelvic cramps, pelvic pressure, or nagging pain in your abdominal area.  You have lower back pain.  You have persistent nausea, vomiting, or  diarrhea.  You have an unusual or bad smelling vaginal discharge.  You have pain when you urinate. Get help right away if:  Your water breaks before 37 weeks.  You have regular contractions less than 5 minutes apart before 37 weeks.  You have a fever.  You are leaking fluid from your vagina.  You have spotting or bleeding from your vagina.  You have severe abdominal pain or cramping.  You have rapid weight loss or weight gain.  You have   shortness of breath with chest pain.  You notice sudden or extreme swelling of your face, hands, ankles, feet, or legs.  Your baby makes fewer than 10 movements in 2 hours.  You have severe headaches that do not go away when you take medicine.  You have vision changes. Summary  The third trimester is from week 28 through week 40, months 7 through 9. The third trimester is a time when the unborn baby (fetus) is growing rapidly.  During the third trimester, your discomfort may increase as you and your baby continue to gain weight. You may have abdominal, leg, and back pain, sleeping problems, and an increased need to urinate.  During the third trimester your breasts will keep growing and they will continue to become tender. A yellow fluid (colostrum) may leak from your breasts. This is the first milk you are producing for your baby.  False labor is a condition in which you feel small, irregular tightenings of the muscles in the womb (contractions) that eventually go away. These are called Braxton Hicks contractions. Contractions may last for hours, days, or even weeks before true labor sets in.  Signs of labor can include: abdominal cramps; regular contractions that start at 10 minutes apart and become stronger and more frequent with time; watery or bloody mucus discharge that comes from the vagina; increased pelvic pressure and dull back pain; and leaking of amniotic fluid. This information is not intended to replace advice given to you by your  health care provider. Make sure you discuss any questions you have with your health care provider. Document Revised: 12/13/2018 Document Reviewed: 09/27/2016 [employer]vier Patient Education  2020 [employer]  

## 2020-02-18 NOTE — Progress Notes (Signed)
C/o occasional headaches =4, takes tylenol with relief.  Annasophia Crocker,RN  cbg's from [employer]

## 2020-02-18 NOTE — Progress Notes (Signed)
Addendum: 11:47 while patient was in office she did sign transportatin waiver. Judith Terry

## 2020-02-18 NOTE — Progress Notes (Signed)
cbg's from [employer];  Yoskar Murrillo,RN

## 2020-02-19 LAB — GC/CHLAMYDIA PROBE AMP (~~LOC~~) NOT AT ARMC
Chlamydia: NEGATIVE
Comment: NEGATIVE
Comment: NORMAL
Neisseria Gonorrhea: NEGATIVE

## 2020-02-19 MED FILL — PRENATAL 27-1 MG TABS: 27-1 | 30 days supply | Qty: 30 | Fill #3

## 2020-02-19 MED FILL — ACCU-CHEK GUIDE TEST STRIP: 30 days supply | Qty: 100 | Fill #2

## 2020-02-21 ENCOUNTER — Other Ambulatory Visit: Payer: Self-pay

## 2020-02-21 ENCOUNTER — Other Ambulatory Visit: Payer: Self-pay | Admitting: Obstetrics

## 2020-02-21 ENCOUNTER — Encounter (HOSPITAL_COMMUNITY): Payer: Self-pay | Admitting: Obstetrics and Gynecology

## 2020-02-21 ENCOUNTER — Ambulatory Visit: Payer: Medicaid Other | Admitting: *Deleted

## 2020-02-21 ENCOUNTER — Inpatient Hospital Stay (HOSPITAL_COMMUNITY)
Admission: RE | Admit: 2020-02-21 | Discharge: 2020-02-24 | DRG: 806 | Disposition: A | Discharge status: Home / Self Care | Payer: Medicaid Other | Attending: Obstetrics and Gynecology | Admitting: Obstetrics and Gynecology

## 2020-02-21 ENCOUNTER — Ambulatory Visit (HOSPITAL_BASED_OUTPATIENT_CLINIC_OR_DEPARTMENT_OTHER): Payer: Medicaid Other

## 2020-02-21 ENCOUNTER — Encounter: Payer: Self-pay | Admitting: Obstetrics and Gynecology

## 2020-02-21 DIAGNOSIS — O99824 Streptococcus B carrier state complicating childbirth: Secondary | ICD-10-CM | POA: Diagnosis present

## 2020-02-21 DIAGNOSIS — B009 Herpesviral infection, unspecified: Secondary | ICD-10-CM | POA: Diagnosis present

## 2020-02-21 DIAGNOSIS — Z3490 Encounter for supervision of normal pregnancy, unspecified, unspecified trimester: Secondary | ICD-10-CM | POA: Diagnosis present

## 2020-02-21 DIAGNOSIS — O24425 Gestational diabetes mellitus in childbirth, controlled by oral hypoglycemic drugs: Secondary | ICD-10-CM | POA: Diagnosis present

## 2020-02-21 DIAGNOSIS — Z148 Genetic carrier of other disease: Secondary | ICD-10-CM

## 2020-02-21 DIAGNOSIS — O0990 Supervision of high risk pregnancy, unspecified, unspecified trimester: Secondary | ICD-10-CM

## 2020-02-21 DIAGNOSIS — O99820 Streptococcus B carrier state complicating pregnancy: Secondary | ICD-10-CM | POA: Insufficient documentation

## 2020-02-21 DIAGNOSIS — Z3A37 37 weeks gestation of pregnancy: Secondary | ICD-10-CM

## 2020-02-21 DIAGNOSIS — O134 Gestational [pregnancy-induced] hypertension without significant proteinuria, complicating childbirth: Principal | ICD-10-CM | POA: Diagnosis present

## 2020-02-21 DIAGNOSIS — A6000 Herpesviral infection of urogenital system, unspecified: Secondary | ICD-10-CM | POA: Diagnosis present

## 2020-02-21 DIAGNOSIS — O3663X1 Maternal care for excessive fetal growth, third trimester, fetus 1: Secondary | ICD-10-CM | POA: Insufficient documentation

## 2020-02-21 DIAGNOSIS — O99213 Obesity complicating pregnancy, third trimester: Secondary | ICD-10-CM | POA: Diagnosis not present

## 2020-02-21 DIAGNOSIS — O24415 Gestational diabetes mellitus in pregnancy, controlled by oral hypoglycemic drugs: Secondary | ICD-10-CM | POA: Insufficient documentation

## 2020-02-21 DIAGNOSIS — Z30017 Encounter for initial prescription of implantable subdermal contraceptive: Secondary | ICD-10-CM

## 2020-02-21 DIAGNOSIS — O9832 Other infections with a predominantly sexual mode of transmission complicating childbirth: Secondary | ICD-10-CM | POA: Diagnosis present

## 2020-02-21 DIAGNOSIS — K219 Gastro-esophageal reflux disease without esophagitis: Secondary | ICD-10-CM | POA: Diagnosis present

## 2020-02-21 DIAGNOSIS — Z8759 Personal history of other complications of pregnancy, childbirth and the puerperium: Secondary | ICD-10-CM

## 2020-02-21 DIAGNOSIS — O9962 Diseases of the digestive system complicating childbirth: Secondary | ICD-10-CM | POA: Diagnosis present

## 2020-02-21 DIAGNOSIS — O3660X0 Maternal care for excessive fetal growth, unspecified trimester, not applicable or unspecified: Secondary | ICD-10-CM | POA: Diagnosis present

## 2020-02-21 DIAGNOSIS — O99214 Obesity complicating childbirth: Secondary | ICD-10-CM | POA: Diagnosis present

## 2020-02-21 DIAGNOSIS — Z20822 Contact with and (suspected) exposure to covid-19: Secondary | ICD-10-CM | POA: Diagnosis present

## 2020-02-21 DIAGNOSIS — O99210 Obesity complicating pregnancy, unspecified trimester: Secondary | ICD-10-CM | POA: Diagnosis present

## 2020-02-21 DIAGNOSIS — O09293 Supervision of pregnancy with other poor reproductive or obstetric history, third trimester: Secondary | ICD-10-CM | POA: Diagnosis not present

## 2020-02-21 DIAGNOSIS — O3663X0 Maternal care for excessive fetal growth, third trimester, not applicable or unspecified: Secondary | ICD-10-CM | POA: Diagnosis present

## 2020-02-21 LAB — COMPREHENSIVE METABOLIC PANEL
ALT: 19 U/L (ref 0–44)
AST: 34 U/L (ref 15–41)
Albumin: 2.5 g/dL — ABNORMAL LOW (ref 3.5–5.0)
Alkaline Phosphatase: 106 U/L (ref 38–126)
Anion gap: 9 (ref 5–15)
BUN: 11 mg/dL (ref 6–20)
CO2: 19 mmol/L — ABNORMAL LOW (ref 22–32)
Calcium: 9 mg/dL (ref 8.9–10.3)
Chloride: 110 mmol/L (ref 98–111)
Creatinine, Ser: 0.61 mg/dL (ref 0.44–1.00)
GFR calc Af Amer: >60 mL/min (ref >60)
GFR calc non Af Amer: >60 mL/min (ref >60)
Glucose, Bld: 75 mg/dL (ref 70–99)
Potassium: 4.9 mmol/L (ref 3.5–5.1)
Sodium: 138 mmol/L (ref 135–145)
Total Bilirubin: 0.7 mg/dL (ref 0.3–1.2)
Total Protein: 5.8 g/dL — ABNORMAL LOW (ref 6.5–8.1)

## 2020-02-21 LAB — ABO/RH: ABO/RH(D): O POS

## 2020-02-21 LAB — CBC
HCT: 38.4 % (ref 36.0–46.0)
Hemoglobin: 12.2 g/dL (ref 12.0–15.0)
MCH: 32.3 pg (ref 26.0–34.0)
MCHC: 31.8 g/dL (ref 30.0–36.0)
MCV: 101.6 fL — ABNORMAL HIGH (ref 80.0–100.0)
Platelets: 156 10*3/uL (ref 150–400)
RBC: 3.78 MIL/uL — ABNORMAL LOW (ref 3.87–5.11)
RDW: 13.7 % (ref 11.5–15.5)
WBC: 6.8 10*3/uL (ref 4.0–10.5)
nRBC: 0 % (ref 0.0–0.2)

## 2020-02-21 LAB — GLUCOSE, CAPILLARY: Glucose-Capillary: 64 mg/dL — ABNORMAL LOW (ref 70–99)

## 2020-02-21 LAB — TYPE AND SCREEN
ABO/RH(D): O POS
Antibody Screen: NEGATIVE

## 2020-02-21 LAB — PROTEIN / CREATININE RATIO, URINE
Creatinine, Urine: 141.76 mg/dL
Protein Creatinine Ratio: 0.17 mg/mg{Cre} — ABNORMAL HIGH (ref 0.00–0.15)
Total Protein, Urine: 24 mg/dL

## 2020-02-21 LAB — SARS CORONAVIRUS 2 (TAT 6-24 HRS): SARS Coronavirus 2: NEGATIVE

## 2020-02-21 LAB — CULTURE, BETA STREP (GROUP B ONLY): Strep Gp B Culture: POSITIVE — AB

## 2020-02-21 MED ORDER — ACETAMINOPHEN 325 MG PO TABS: 650.0000 mg | ORAL_TABLET | ORAL | Status: DC | PRN

## 2020-02-21 MED ORDER — LACTATED RINGERS IV SOLN: INTRAVENOUS | Status: DC

## 2020-02-21 MED ORDER — MISOPROSTOL 25 MCG QUARTER TABLET
25.0000 ug | ORAL_TABLET | ORAL | Status: DC | PRN
  Administered 2020-02-21: 25 ug via VAGINAL
  Filled 2020-02-21: qty 1

## 2020-02-21 MED ORDER — SOD CITRATE-CITRIC ACID 500-334 MG/5ML PO SOLN: 30.0000 mL | ORAL | Status: DC | PRN

## 2020-02-21 MED ORDER — SODIUM CHLORIDE 0.9 % IV SOLN
5.0000 10*6.[IU] | Freq: Once | INTRAVENOUS | Status: AC
  Administered 2020-02-21: 5 10*6.[IU] via INTRAVENOUS
  Filled 2020-02-21: qty 5

## 2020-02-21 MED ORDER — FENTANYL CITRATE (PF) 100 MCG/2ML IJ SOLN
50.0000 ug | INTRAMUSCULAR | Status: DC | PRN
  Administered 2020-02-22 (×3): 100 ug via INTRAVENOUS
  Filled 2020-02-21 (×5): qty 2

## 2020-02-21 MED ORDER — TERBUTALINE SULFATE 1 MG/ML IJ SOLN: 0.2500 mg | Freq: Once | INTRAMUSCULAR | Status: DC | PRN

## 2020-02-21 MED ORDER — LIDOCAINE HCL (PF) 1 % IJ SOLN: 30.0000 mL | INTRAMUSCULAR | Status: DC | PRN

## 2020-02-21 MED ORDER — PENICILLIN G POT IN DEXTROSE 60000 UNIT/ML IV SOLN
3.0000 10*6.[IU] | INTRAVENOUS | Status: DC
  Administered 2020-02-21 – 2020-02-22 (×4): 3 10*6.[IU] via INTRAVENOUS
  Filled 2020-02-21 (×8): qty 50

## 2020-02-21 MED ORDER — OXYTOCIN-SODIUM CHLORIDE 30-0.9 UT/500ML-% IV SOLN
2.5000 [IU]/h | INTRAVENOUS | Status: DC
  Administered 2020-02-22: 2.5 [IU]/h via INTRAVENOUS
  Filled 2020-02-21: qty 500

## 2020-02-21 MED ORDER — OXYTOCIN BOLUS FROM INFUSION
333.0000 mL | Freq: Once | INTRAVENOUS | Status: AC
  Administered 2020-02-22: 333 mL via INTRAVENOUS

## 2020-02-21 MED ORDER — ONDANSETRON HCL 4 MG/2ML IJ SOLN
4.0000 mg | Freq: Four times a day (QID) | INTRAMUSCULAR | Status: DC | PRN
  Administered 2020-02-21 – 2020-02-22 (×2): 4 mg via INTRAVENOUS
  Filled 2020-02-21 (×2): qty 2

## 2020-02-21 MED ORDER — FENTANYL CITRATE (PF) 100 MCG/2ML IJ SOLN
50.0000 ug | INTRAMUSCULAR | Status: DC | PRN
  Administered 2020-02-21 – 2020-02-22 (×5): 100 ug via INTRAVENOUS
  Filled 2020-02-21 (×3): qty 2

## 2020-02-21 MED ORDER — LACTATED RINGERS IV SOLN: 500.0000 mL | INTRAVENOUS | Status: DC | PRN

## 2020-02-21 NOTE — H&P (Signed)
OBSTETRIC ADMISSION HISTORY AND PHYSICAL  Alyssabeth Bruster is a 24 y.o. female G2P1001 with IUP at [redacted]w[redacted]d presenting for IOL 2/2 gHTN. She reports +FMs. No LOF, VB, blurry vision, headaches, peripheral edema, or RUQ pain. She plans on breast and bottle feeding. She requests Nexplanon for birth control.  Dating: By LMP --->  Estimated Date of Delivery: 03/12/20  Sono:   @[redacted]w[redacted]d , normal anatomy, anterior placenta, cephalic presentation, 4253g, , EFW 9#6  Prenatal History/Complications: GERD HSV on Valtrex, no outbreaks since Feb 2021 Obesity LGA baby GDM (metformin) GBS +  Past Medical History: Past Medical History:  Diagnosis Date  . Anxiety   . Chronic back pain   . GERD (gastroesophageal reflux disease)   . Headache   . Herpes   . Infection    UTI  . Muscle spasm   . Pregnancy induced hypertension   . Trichomoniasis     Past Surgical History: Past Surgical History:  Procedure Laterality Date  . NO PAST SURGERIES      Obstetrical History: OB History    Gravida  2   Para  1   Term  1   Preterm      AB      Living  1     SAB      TAB      Ectopic      Multiple  0   Live Births  1           Social History: Social History   Socioeconomic History  . Marital status: Single    Spouse name: Not on file  . Number of children: Not on file  . Years of education: Not on file  . Highest education level: Not on file  Occupational History  . Not on file  Tobacco Use  . Smoking status: Never Smoker  . Smokeless tobacco: Never Used  Vaping Use  . Vaping Use: Never used  Substance and Sexual Activity  . Alcohol use: No  . Drug use: No  . Sexual activity: Yes    Birth control/protection: None  Other Topics Concern  . Not on file  Social History Narrative  . Not on file   Social Determinants of Health   Financial Resource Strain:   . Difficulty of Paying Living Expenses:   Food Insecurity: No Food Insecurity  . Worried About Mar 2021 in the Last Year: Never true  . Ran Out of Food in the Last Year: Never true  Transportation Needs: Unmet Transportation Needs  . Lack of Transportation (Medical): Yes  . Lack of Transportation (Non-Medical): No  Physical Activity:   . Days of Exercise per Week:   . Minutes of Exercise per Session:   Stress:   . Feeling of Stress :   Social Connections:   . Frequency of Communication with Friends and Family:   . Frequency of Social Gatherings with Friends and Family:   . Attends Religious Services:   . Active Member of Clubs or Organizations:   . Attends Patent examiner Meetings:   Banker Marital Status:     Family History: Family History  Problem Relation Age of Onset  . Diabetes Mother   . Diabetes Father   . Heart disease Father   . Kidney failure Father     Allergies: No Known Allergies  Medications Prior to Admission  Medication Sig Dispense Refill Last Dose  . Accu-Chek Softclix Lancets lancets Gestational Diabetes Give enough for month Use as instructed 50  each 12   . acetaminophen (TYLENOL) 500 MG tablet Take 500 mg by mouth every 6 (six) hours as needed for mild pain or fever.      Marland Kitchen acetaminophen-codeine (TYLENOL #3) 300-30 MG tablet Take 1 tablet by mouth every 6 (six) hours as needed. (Patient not taking: Reported on 02/21/2020)     . aspirin 81 MG chewable tablet Chew 1 tablet (81 mg total) by mouth daily. 90 tablet 3   . busPIRone (BUSPAR) 15 MG tablet TAKE 1 TABLET (15 MG TOTAL) BY MOUTH 2 (TWO) TIMES DAILY. 60 tablet 3   . Butalbital-APAP-Caffeine 50-325-40 MG capsule Take 1 capsule by mouth every 6 (six) hours as needed for headache. 30 capsule 0   . calcium carbonate (TUMS - DOSED IN MG ELEMENTAL CALCIUM) 500 MG chewable tablet Chew 1 tablet by mouth daily.     . chlorhexidine (PERIDEX) 0.12 % solution 15 mLs 2 (two) times daily. (Patient not taking: Reported on 02/21/2020)     . cyclobenzaprine (FLEXERIL) 10 MG tablet Take 1 tablet (10 mg total) by  mouth 3 (three) times daily as needed (headache, tension). 30 tablet 2   . glucose blood (ACCU-CHEK GUIDE) test strip Gestational Diabetes Give enough for month 50 each 12   . metFORMIN (GLUCOPHAGE) 500 MG tablet Take 1 tablet (500 mg total) by mouth 2 (two) times daily with a meal. 60 tablet 5   . omeprazole (PRILOSEC OTC) 20 MG tablet Take 1 tablet (20 mg total) by mouth daily. 30 tablet 3   . prenatal vitamin w/FE, FA (PRENATAL 1 + 1) 27-1 MG TABS tablet Take 1 tablet by mouth daily at 12 noon. 30 tablet 11   . promethazine (PHENERGAN) 25 MG tablet Take 1 tablet (25 mg total) by mouth every 6 (six) hours as needed for nausea or vomiting. 20 tablet 0   . valACYclovir (VALTREX) 500 MG tablet Take 1 tablet (500 mg total) by mouth 2 (two) times daily. 60 tablet 6      Review of Systems:  All systems reviewed and negative except as stated in HPI  PE: Blood pressure (!) 155/90, pulse (!) 108, temperature 98.1 F (36.7 C), temperature source Oral, resp. rate 17, height 5\' 9"  (1.753 m), weight 131.5 kg, last menstrual period 05/29/2019. General appearance: alert, cooperative and appears stated age Lungs: regular rate and effort Heart: regular rate  Abdomen: soft, non-tender Extremities: Homans sign is negative, no sign of DVT Presentation: cephalic EFM: 130 bpm, moderate variability, 15x15 accels, no decels Toco: None Dilation: 2 Effacement (%): 50 Station: -3 Exam by:: Dr. [email or web address] SVE: No herpetic lesions or ulcerations  Prenatal labs: ABO, Rh: --/--/O POS, O POS Performed at [facility], [address] [place], [place]  [phone]) Antibody: NEG (06/18 1422) Rubella: 4.20 (12/22 1204) RPR: Non Reactive (04/13 0854)  HBsAg: Negative (12/22 1204)  HIV: Non Reactive (04/13 0854)  GBS: Positive/-- (06/15 1005)  2 hr GTT 116/228/136  Prenatal Transfer Tool  Maternal Diabetes: Yes:  Diabetes Type:  Insulin/Medication controlled Genetic Screening:  Normal Maternal Ultrasounds/Referrals: Normal, LGA Fetal Ultrasounds or other Referrals:  Referred to Materal Fetal Medicine  Maternal Substance Abuse:  No Significant Maternal Medications:  Meds include: Other:  Valtrex Significant Maternal Lab Results: Group B Strep positive  Results for orders placed or performed during the hospital encounter of 02/21/20 (from the past 24 hour(s))  CBC   Collection Time: 02/21/20  2:22 PM  Result Value Ref Range  WBC 6.8 4.0 - 10.5 K/uL   RBC 3.78 (L) 3.87 - 5.11 MIL/uL   Hemoglobin 12.2 12.0 - 15.0 g/dL   HCT 38.4 36 - 46 %   MCV 101.6 (H) 80.0 - 100.0 fL   MCH 32.3 26.0 - 34.0 pg   MCHC 31.8 30.0 - 36.0 g/dL   RDW 13.7 11.5 - 15.5 %   Platelets 156 150 - 400 K/uL   nRBC 0.0 0.0 - 0.2 %  Comprehensive metabolic panel   Collection Time: 02/21/20  2:22 PM  Result Value Ref Range   Sodium 138 135 - 145 mmol/L   Potassium 4.9 3.5 - 5.1 mmol/L   Chloride 110 98 - 111 mmol/L   CO2 19 (L) 22 - 32 mmol/L   Glucose, Bld 75 70 - 99 mg/dL   BUN 11 6 - 20 mg/dL   Creatinine, Ser 0.61 0.44 - 1.00 mg/dL   Calcium 9.0 8.9 - 10.3 mg/dL   Total Protein 5.8 (L) 6.5 - 8.1 g/dL   Albumin 2.5 (L) 3.5 - 5.0 g/dL   AST 34 15 - 41 U/L   ALT 19 0 - 44 U/L   Alkaline Phosphatase 106 38 - 126 U/L   Total Bilirubin 0.7 0.3 - 1.2 mg/dL   GFR calc non Af Amer >60 >60 mL/min   GFR calc Af Amer >60 >60 mL/min   Anion gap 9 5 - 15  Type and screen [place]   Collection Time: 02/21/20  2:22 PM  Result Value Ref Range   ABO/RH(D) O POS    Antibody Screen NEG    Sample Expiration      02/24/2020,2359 Performed at [facility], [address] [place], [place] [address]   ABO/Rh   Collection Time: 02/21/20  2:22 PM  Result Value Ref Range   ABO/RH(D)      O POS Performed at [place] [address] [place], [place] [address]   Protein / creatinine ratio, urine   Collection Time: 02/21/20  4:16 PM  Result Value Ref Range    Creatinine, Urine 141.76 mg/dL   Total Protein, Urine 24 mg/dL   Protein Creatinine Ratio 0.17 (H) 0.00 - 0.15 mg/mg[Cre]  Glucose, capillary   Collection Time: 02/21/20  7:59 PM  Result Value Ref Range   Glucose-Capillary 64 (L) 70 - 99 mg/dL    Patient Active Problem List   Diagnosis Date Noted  . GBS (group B Streptococcus carrier), +RV culture, currently pregnant 02/21/2020  . Encounter for induction of labor 02/21/2020  . LGA (large for gestational age) fetus affecting management of mother 01/31/2020  . Migraine without aura and without status migrainosus, not intractable 01/31/2020  . Gestational diabetes mellitus (GDM) treated with oral hypoglycemic therapy 01/07/2020  . Supervision of high risk pregnancy, antepartum 08/27/2019  . History of pre-eclampsia 08/27/2019  . Obesity in pregnancy 08/27/2019  . Herpes 03/15/2019  . GERD (gastroesophageal reflux disease) 11/25/2014    Assessment: Kensington Rios is a 24 y.o. G2P1001 at [redacted]w[redacted]d here for IOL 2/2 gHTN  1. Labor: Risks and benefits of induction were reviewed, including failure of method, prolonged labor, need for further intervention, risk of cesarean.  Patient and family seem to understand these risks and wish to proceed. Options of cytotec, foley bulb, AROM, and pitocin reviewed, with use of each discussed. She had an induction with her first child. Begin with cytotec.  2. FWB: Cat 1, EFW 4200 g. Shoulder precautions discussed  in detail, including but not limited to: need for additional procedures, additional providers in the room, attendance of NICU staff, potential need for emergent Cesarean delivery.  3. Pain: Per Patient request  4. GBS: positive, PCN  5. GHTN: Pre-E labs normal, no severe symptoms or pressures.   Plan: Admit to L&D, anticipate vaginal dleivery  Caton Popowski L Elham Fini, DO  02/21/2020, 8:02 PM

## 2020-02-21 NOTE — MAU Note (Signed)
Pt was sent over for direct admission for increase in blood pressures  Holding for L&D. Pt denies any pain.

## 2020-02-22 ENCOUNTER — Encounter (HOSPITAL_COMMUNITY): Payer: Self-pay | Admitting: Obstetrics and Gynecology

## 2020-02-22 DIAGNOSIS — O134 Gestational [pregnancy-induced] hypertension without significant proteinuria, complicating childbirth: Secondary | ICD-10-CM

## 2020-02-22 DIAGNOSIS — Z3A37 37 weeks gestation of pregnancy: Secondary | ICD-10-CM

## 2020-02-22 DIAGNOSIS — O99824 Streptococcus B carrier state complicating childbirth: Secondary | ICD-10-CM

## 2020-02-22 LAB — GLUCOSE, CAPILLARY
Glucose-Capillary: 67 mg/dL — ABNORMAL LOW (ref 70–99)
Glucose-Capillary: 71 mg/dL (ref 70–99)
Glucose-Capillary: 75 mg/dL (ref 70–99)
Glucose-Capillary: 88 mg/dL (ref 70–99)
Glucose-Capillary: 93 mg/dL (ref 70–99)

## 2020-02-22 LAB — RPR: RPR Ser Ql: NONREACTIVE

## 2020-02-22 MED ORDER — PRENATAL MULTIVITAMIN CH
1.0000 | ORAL_TABLET | Freq: Every day | ORAL | Status: DC
  Administered 2020-02-23 – 2020-02-24 (×2): 1 via ORAL
  Filled 2020-02-22 (×2): qty 1

## 2020-02-22 MED ORDER — SIMETHICONE 80 MG PO CHEW: 80.0000 mg | CHEWABLE_TABLET | ORAL | Status: DC | PRN

## 2020-02-22 MED ORDER — EPHEDRINE 5 MG/ML INJ: 10.0000 mg | INTRAVENOUS | Status: DC | PRN

## 2020-02-22 MED ORDER — OXYTOCIN-SODIUM CHLORIDE 30-0.9 UT/500ML-% IV SOLN
1.0000 m[IU]/min | INTRAVENOUS | Status: DC
  Administered 2020-02-22: 2 m[IU]/min via INTRAVENOUS

## 2020-02-22 MED ORDER — COCONUT OIL OIL: 1.0000 "application " | TOPICAL_OIL | Status: DC | PRN

## 2020-02-22 MED ORDER — SENNOSIDES-DOCUSATE SODIUM 8.6-50 MG PO TABS
2.0000 | ORAL_TABLET | ORAL | Status: DC
  Administered 2020-02-24: 2 via ORAL
  Filled 2020-02-22 (×2): qty 2

## 2020-02-22 MED ORDER — ONDANSETRON HCL 4 MG/2ML IJ SOLN: 4.0000 mg | INTRAMUSCULAR | Status: DC | PRN

## 2020-02-22 MED ORDER — TERBUTALINE SULFATE 1 MG/ML IJ SOLN: 0.2500 mg | Freq: Once | INTRAMUSCULAR | Status: DC | PRN

## 2020-02-22 MED ORDER — DIPHENHYDRAMINE HCL 50 MG/ML IJ SOLN: 12.5000 mg | INTRAMUSCULAR | Status: DC | PRN

## 2020-02-22 MED ORDER — PHENYLEPHRINE 40 MCG/ML (10ML) SYRINGE FOR IV PUSH (FOR BLOOD PRESSURE SUPPORT): 80.0000 ug | PREFILLED_SYRINGE | INTRAVENOUS | Status: DC | PRN

## 2020-02-22 MED ORDER — TETANUS-DIPHTH-ACELL PERTUSSIS 5-2.5-18.5 LF-MCG/0.5 IM SUSP: 0.5000 mL | Freq: Once | INTRAMUSCULAR | Status: DC

## 2020-02-22 MED ORDER — LACTATED RINGERS IV SOLN: 500.0000 mL | Freq: Once | INTRAVENOUS | Status: DC

## 2020-02-22 MED ORDER — BUSPIRONE HCL 5 MG PO TABS
15.0000 mg | ORAL_TABLET | Freq: Two times a day (BID) | ORAL | Status: DC
  Administered 2020-02-22 – 2020-02-24 (×4): 15 mg via ORAL
  Filled 2020-02-22 (×4): qty 3

## 2020-02-22 MED ORDER — BENZOCAINE-MENTHOL 20-0.5 % EX AERO: 1.0000 "application " | INHALATION_SPRAY | CUTANEOUS | Status: DC | PRN

## 2020-02-22 MED ORDER — DIBUCAINE (PERIANAL) 1 % EX OINT: 1.0000 "application " | TOPICAL_OINTMENT | CUTANEOUS | Status: DC | PRN

## 2020-02-22 MED ORDER — DIPHENHYDRAMINE HCL 25 MG PO CAPS: 25.0000 mg | ORAL_CAPSULE | Freq: Four times a day (QID) | ORAL | Status: DC | PRN

## 2020-02-22 MED ORDER — ACETAMINOPHEN 325 MG PO TABS
650.0000 mg | ORAL_TABLET | Freq: Four times a day (QID) | ORAL | Status: DC | PRN
  Administered 2020-02-22 – 2020-02-24 (×4): 650 mg via ORAL
  Filled 2020-02-22 (×4): qty 2

## 2020-02-22 MED ORDER — FENTANYL-BUPIVACAINE-NACL 0.5-0.125-0.9 MG/250ML-% EP SOLN: 12.0000 mL/h | EPIDURAL | Status: DC | PRN

## 2020-02-22 MED ORDER — IBUPROFEN 600 MG PO TABS
600.0000 mg | ORAL_TABLET | Freq: Three times a day (TID) | ORAL | Status: DC | PRN
  Administered 2020-02-22 – 2020-02-24 (×6): 600 mg via ORAL
  Filled 2020-02-22 (×6): qty 1

## 2020-02-22 MED ORDER — WITCH HAZEL-GLYCERIN EX PADS: 1.0000 "application " | MEDICATED_PAD | CUTANEOUS | Status: DC | PRN

## 2020-02-22 MED ORDER — ONDANSETRON HCL 4 MG PO TABS: 4.0000 mg | ORAL_TABLET | ORAL | Status: DC | PRN

## 2020-02-22 MED ORDER — MEASLES, MUMPS & RUBELLA VAC IJ SOLR: 0.5000 mL | Freq: Once | INTRAMUSCULAR | Status: DC

## 2020-02-22 NOTE — Progress Notes (Signed)
LABOR PROGRESS NOTE  Judith Terry is a 24 y.o. G2P1001 at [redacted]w[redacted]d  admitted for IOL for gHTN, GDMA2, LGA.  Subjective: Contractions are uncomfortable, thinks she might feel some pressure  Objective: BP (!) 146/78   Pulse 95   Temp 97.8 F (36.6 C) (Oral)   Resp 18   Ht 5\' 9"  (1.753 m)   Wt 131.5 kg   LMP 05/29/2019   BMI 42.83 kg/m  or  Vitals:   02/22/20 0112 02/22/20 0423 02/22/20 0539 02/22/20 0600  BP: (!) 147/84 (!) 141/92 140/88 (!) 146/78  Pulse: 85 90 96 95  Resp:      Temp:  97.8 F (36.6 C)    TempSrc:  Oral    Weight:      Height:         Dilation: 4 Effacement (%): 50 Cervical Position: Posterior Station: -3 Presentation: Vertex Exam by:: Dr. [email or web address] FHT: baseline rate 135, moderate varibility, +acel, -decel Toco: regular q2-3 min  Labs: Lab Results  Component Value Date   WBC 6.8 02/21/2020   HGB 12.2 02/21/2020   HCT 38.4 02/21/2020   MCV 101.6 (H) 02/21/2020   PLT 156 02/21/2020    Patient Active Problem List   Diagnosis Date Noted  . GBS (group B Streptococcus carrier), +RV culture, currently pregnant 02/21/2020  . Encounter for induction of labor 02/21/2020  . LGA (large for gestational age) fetus affecting management of mother 01/31/2020  . Migraine without aura and without status migrainosus, not intractable 01/31/2020  . Gestational diabetes mellitus (GDM) treated with oral hypoglycemic therapy 01/07/2020  . Supervision of high risk pregnancy, antepartum 08/27/2019  . History of pre-eclampsia 08/27/2019  . Obesity in pregnancy 08/27/2019  . Herpes 03/15/2019  . GERD (gastroesophageal reflux disease) 11/25/2014    Assessment / Plan: 24 y.o. G2P1001 at [redacted]w[redacted]d here for IOL for gHTN.  Labor: s/p miso x1, on pit since 0009. Infant still at high station, cont pitocin augmentation, AROM once more descended.  Fetal Wellbeing:  Cat I Pain Control:  IV pain meds, nitrous. Not planning epidural at the moment GBS: Positive, on  penicillin Anticipated MOD:  SVD  gHTN: mild range BP's since admission, unremarkable labs, ctm symptoms  GDMA2: on metformin prior to admission, CBG q4h latent q2h active. Well controlled so far (75>71>67)  Fetal macrosomia: pelvis proven to 3050g. [place] significantly less than 0010 from 6/18 when EFW 4253g, on my exam closer to 3700g. Also questionable as prior 7/18 from 5/26 EFW 2877g, highly unlikely to have grown 1400g over three weeks. Shoulder precautions already discussed on admission, patient is 5'9".  05-01-2004, MD/[facility] OB Fellow  02/22/2020, 6:33 AM

## 2020-02-22 NOTE — Discharge Summary (Addendum)
Postpartum Discharge Summary     Patient Name: Judith Terry DOB: Aug 08, 1996 MRN: [id number]  Date of admission: 02/21/2020 Delivery date:02/22/2020  Delivering provider: Lenna Sciara  Date of discharge: 02/24/2020  Admitting diagnosis: Encounter for induction of labor [Z34.90] Intrauterine pregnancy: [redacted]w[redacted]d    Secondary diagnosis:  Active Problems:   Herpes   History of pre-eclampsia   Obesity in pregnancy   Gestational diabetes mellitus (GDM) treated with oral hypoglycemic therapy   LGA (large for gestational age) fetus affecting management of mother   GBS (group B Streptococcus carrier), +RV culture, currently pregnant   Encounter for induction of labor   SVD (spontaneous vaginal delivery)  Additional problems: None    Discharge diagnosis: Term Pregnancy Delivered, Gestational Hypertension and GDM A2                                              Post partum procedures:Nexplanon insertion Augmentation: Pitocin and Cytotec Complications: None  Hospital course: Induction of Labor With Vaginal Delivery   24y.o. yo G2P1001 at 36w2das admitted to the hospital 02/21/2020 for induction of labor.  Indication for induction: Gestational hypertension.  Patient had an uncomplicated labor course as follows: Given 1 dose of Cytotec for cervical ripening.  Started on pitocin.  Had SROM.  Progressed to complete without complication. Membrane Rupture Time/Date: 7:20 AM ,02/22/2020   Delivery Method:Vaginal, Spontaneous  Episiotomy: None  Lacerations:  None  Details of delivery can be found in separate delivery note. BP's monitored post-partum and elevated up to 130-140s/80-90s, started on lisinopril 5 mg daily. Fasting AM glucose 84. Patient had a routine postpartum course. Patient is discharged home 02/24/20.  Newborn Data: Birth date:02/22/2020  Birth time:11:15 AM  Gender:Female  Living status:Living  Apgars:9 ,9  Weight:3500 g   Magnesium Sulfate received: No BMZ received:  No Rhophylac:N/A MMR:N/A T-DaP:Given prenatally Flu: No Transfusion:No  Physical exam  Vitals:   02/23/20 1511 02/24/20 0050 02/24/20 0535 02/24/20 0945  BP: 125/77 (!) 141/86 121/70 138/62  Pulse: 69 77 88   Resp: _0 Temp: 98.3 F (36.8 C) 98.8 F (37.1 C) 98.4 F (36.9 C)   TempSrc: Oral Oral Oral   SpO2: 100% 100%    Weight:      Height:       General: alert, cooperative and no distress Lochia: appropriate Uterine Fundus: firm DVT Evaluation: No evidence of DVT seen on physical exam. Labs: Lab Results  Component Value Date   WBC 6.8 02/21/2020   HGB 12.2 02/21/2020   HCT 38.4 02/21/2020   MCV 101.6 (H) 02/21/2020   PLT 156 02/21/2020   CMP Latest Ref Rng & Units 02/21/2020  Glucose 70 - 99 mg/dL 75  BUN 6 - 20 mg/dL 11  Creatinine 0.44 - 1.00 mg/dL 0.61  Sodium 135 - 145 mmol/L 138  Potassium 3.5 - 5.1 mmol/L 4.9  Chloride 98 - 111 mmol/L 110  CO2 22 - 32 mmol/L 19(L)  Calcium 8.9 - 10.3 mg/dL 9.0  Total Protein 6.5 - 8.1 g/dL 5.8(L)  Total Bilirubin 0.3 - 1.2 mg/dL 0.7  Alkaline Phos 38 - 126 U/L 106  AST 15 - 41 U/L 34  ALT 0 - 44 U/L 19   Edinburgh Score: Edinburgh Postnatal Depression Scale Screening Tool 02/23/2020  I have been able to laugh and see the  funny side of things. 0  I have looked forward with enjoyment to things. 0  I have blamed myself unnecessarily when things went wrong. 0  I have been anxious or worried for no good reason. 0  I have felt scared or panicky for no good reason. 0  Things have been getting on top of me. 1  I have been so unhappy that I have had difficulty sleeping. 0  I have felt sad or miserable. 0  I have been so unhappy that I have been crying. 0  The thought of harming myself has occurred to me. 0  Edinburgh Postnatal Depression Scale Total 1     After visit meds:  Allergies as of 02/24/2020   No Known Allergies     Medication List    STOP taking these medications   Accu-Chek Guide test  strip Generic drug: glucose blood   Accu-Chek Softclix Lancets lancets   aspirin 81 MG chewable tablet   chlorhexidine 0.12 % solution Commonly known as: PERIDEX   cyclobenzaprine 10 MG tablet Commonly known as: FLEXERIL   metFORMIN 500 MG tablet Commonly known as: GLUCOPHAGE   promethazine 25 MG tablet Commonly known as: PHENERGAN     TAKE these medications   acetaminophen 500 MG tablet Commonly known as: TYLENOL Take 500 mg by mouth every 6 (six) hours as needed for mild pain or fever.   acetaminophen-codeine 300-30 MG tablet Commonly known as: TYLENOL #3 Take 1 tablet by mouth every 6 (six) hours as needed.   busPIRone 15 MG tablet Commonly known as: BUSPAR TAKE 1 TABLET (15 MG TOTAL) BY MOUTH 2 (TWO) TIMES DAILY.   Butalbital-APAP-Caffeine 50-325-40 MG capsule Take 1 capsule by mouth every 6 (six) hours as needed for headache.   calcium carbonate 500 MG chewable tablet Commonly known as: TUMS - dosed in mg elemental calcium Chew 1 tablet by mouth daily.   ibuprofen 600 MG tablet Commonly known as: ADVIL Take 1 tablet (600 mg total) by mouth every 8 (eight) hours as needed for mild pain.   lisinopril 5 MG tablet Commonly known as: ZESTRIL Take 1 tablet (5 mg total) by mouth daily.   omeprazole 20 MG tablet Commonly known as: PRILOSEC OTC Take 1 tablet (20 mg total) by mouth daily.   prenatal vitamin w/FE, FA 27-1 MG Tabs tablet Take 1 tablet by mouth daily at 12 noon.   valACYclovir 500 MG tablet Commonly known as: VALTREX Take 1 tablet (500 mg total) by mouth 2 (two) times daily.      Discharge home in stable condition Infant Feeding: Bottle and Breast Infant Disposition: NICU Discharge instruction: per After Visit Summary and Postpartum booklet. Activity: Advance as tolerated. Pelvic rest for 6 weeks.  Diet: routine diet Future Appointments: Future Appointments  Date Time Provider [place]  03/03/2020 10:20 AM [facility] NURSE [facility]  [facility]  03/26/2020  9:35 AM Griffin Basil, MD [facility]  05/15/2020  2:20 PM Azzie Glatter, FNP [facility] None   Follow up Visit:  Please schedule this patient for a In person postpartum visit in 4 weeks with the following provider: Any provider. Additional Postpartum F/U:2 hour GTT and BP check 1 week  High risk pregnancy complicated by: GDM and HTN Delivery mode:  Vaginal, Spontaneous  Anticipated Birth Control:  Nexplanon (inserted 02/24/2020)   Ian Bushman, MD PGY-2 Resident Family Medicine 02/24/2020, 11:49 AM  Midwife attestation I have seen and examined this patient and agree with above documentation in the resident's  note.   Miette Molenda is a 24 y.o. [id number] s/p SVD.  Pain is well controlled. Plan for birth control is Nexplanon-placed inpatient. Method of Feeding: breast and bottle  PE:  Gen: well appearing Heart: reg rate Lungs: normal WOB Fundus firm Ext: no pain, no edema  Recent Labs    02/21/20 1422  HGB 12.2  HCT 38.4    Assessment S/p SVD PPD # 2  Plan: - discharge home - postpartum care discussed - f/u in office in 6 weeks for postpartum visit   Julianne Handler, CNM 11:49 AM

## 2020-02-22 NOTE — Progress Notes (Signed)
LABOR PROGRESS NOTE  Judith Terry is a 24 y.o. G2P1001 at [redacted]w[redacted]d  admitted for IOL for gHTN, GDMA2, LGA.  Subjective: Starting to feel more uncomfortable  Objective: BP (!) 144/78   Pulse (!) 104   Temp 98.1 F (36.7 C) (Oral)   Resp 17   Ht 5\' 9"  (1.753 m)   Wt 131.5 kg   LMP 05/29/2019   BMI 42.83 kg/m  or  Vitals:   02/21/20 1946 02/21/20 2121 02/21/20 2247 02/21/20 2334  BP: (!) 155/90 (!) 151/96 (!) 143/78 (!) 144/78  Pulse: (!) 108 (!) 115 91 (!) 104  Resp: 17     Temp: 98.1 F (36.7 C)     TempSrc: Oral     Weight: 131.5 kg     Height: 5\' 9"  (1.753 m)        Dilation: 3 Effacement (%): Thick Cervical Position: Anterior Station: -3 Presentation: Vertex Exam by:: Dr. FHT: baseline rate 140, moderate varibility, +acel, -decel Toco: regular q2 min  Labs: Lab Results  Component Value Date   WBC 6.8 02/21/2020   HGB 12.2 02/21/2020   HCT 38.4 02/21/2020   MCV 101.6 (H) 02/21/2020   PLT 156 02/21/2020    Patient Active Problem List   Diagnosis Date Noted  . GBS (group B Streptococcus carrier), +RV culture, currently pregnant 02/21/2020  . Encounter for induction of labor 02/21/2020  . LGA (large for gestational age) fetus affecting management of mother 01/31/2020  . Migraine without aura and without status migrainosus, not intractable 01/31/2020  . Gestational diabetes mellitus (GDM) treated with oral hypoglycemic therapy 01/07/2020  . Supervision of high risk pregnancy, antepartum 08/27/2019  . History of pre-eclampsia 08/27/2019  . Obesity in pregnancy 08/27/2019  . Herpes 03/15/2019  . GERD (gastroesophageal reflux disease) 11/25/2014    Assessment / Plan: 24 y.o. G2P1001 at [redacted]w[redacted]d here for IOL for gHTN.  Labor: s/p miso x1, now 3/40%/-3,-2. Still w some thickness to cervix but contracting too often for miso and cervix with some favorable characteristics (mid position, starting to soften). Will start low dose pitocin 2x2.  Fetal  Wellbeing:  Cat I Pain Control:  IV pain meds, nitrous. Not planning epidural at the moment GBS: Positive, on penicillin Anticipated MOD:  SVD  gHTN: mild range BP's since admission, unremarkable labs, ctm symptoms  GDMA2: on metformin prior to admission, CBG q4h latent q2h active. Well controlled so far (75>71)  Fetal macrosomia: pelvis proven to 3050g. Leopolds significantly less than 30 from 6/18 when EFW 4253g, on my exam closer to 3700g. Also questionable as prior US from 5/26 EFW 2877g, highly unlikely to have grown 1400g over three weeks. Shoulder precautions already discussed on admission, patient is 5'9".  6/26, MD/[facility] OB Fellow  02/22/2020, 12:10 AM

## 2020-02-23 LAB — GLUCOSE, CAPILLARY: Glucose-Capillary: 84 mg/dL (ref 70–99)

## 2020-02-23 NOTE — Progress Notes (Signed)
Mom with baby in NICU most of 3-7 shift. Mom stated it was a good visit. Pain Meds given for cramping. Mom currently using DEBP

## 2020-02-23 NOTE — Lactation Note (Signed)
This note was copied from a baby's chart. Lactation Consultation Note  Patient Name: Judith Terry SAYTK'Z Date: 02/23/2020 Reason for consult: Initial assessment;NICU baby;Early term 37-38.6wks;Maternal endocrine disorder Type of Endocrine Disorder?: Diabetes  LC in to visit with P2 Mom of ET infant at 30 hrs old.  Baby at 3% weight loss.  Mom on Metformin during pregnancy and baby had low blood sugars, given formula/gel and then transferred to NICU last evening.  DEBP set up in room, but Mom states she hasn't started pumping.  She has a 23 yr old, that she breastfed for 1 month before her milk started decreasing.  She feels it was due to getting a Nexplanon inserted in hospital after baby was born.  She doesn't want that this time as she would like to breast feed longer.  Reviewed pumping and importance of breast massage and hand expression, >8 times per 24 hrs.   Mom doesn't have a DEBP at home.  Has WIC, referral faxed to [facility] office.  Mom aware of pump in baby's room that she can use.  Encouraged STS with baby when able to.  Encouraged Mom to call for help prn.  Mom given NICU booklet along with lactation brochure.  Explained about our lactation support available after she is discharge.    Interventions Interventions: Breast feeding basics reviewed;Skin to skin;Breast massage;Hand express;DEBP  Lactation Tools Discussed/Used Tools: Pump;Flanges Flange Size: 24 Breast pump type: Double-Electric Breast Pump WIC Program: Yes Pump Review: Setup, frequency, and cleaning;Milk Storage Initiated by:: RN Date initiated:: 02/23/20   Consult Status Consult Status: Follow-up Date: 02/24/20 Follow-up type: In-patient    Judith Terry 02/23/2020, 2:28 PM

## 2020-02-23 NOTE — Progress Notes (Signed)
Post Partum Day 1 s/p SVD.  Subjective: no complaints, up ad lib, voiding, tolerating PO and + flatus  Objective: Blood pressure 123/75, pulse 84, temperature 98 F (36.7 C), temperature source Oral, resp. rate 18, height 5\' 9"  (1.753 m), weight 131.5 kg, last menstrual period 05/29/2019, SpO2 100 %, unknown if currently breastfeeding.  Physical Exam:  General: alert, cooperative and no distress Lochia: appropriate Uterine Fundus: firm DVT Evaluation: No evidence of DVT seen on physical exam.  Recent Labs    02/21/20 1422  HGB 12.2  HCT 38.4    Assessment/Plan: Plan for discharge tomorrow and Contraception Nexplanon later today (consented).  Baby is in NICU, so deger circumcision for now.   LOS: 2 days    Rein Popov 02/23/20, MD PGY-2 Resident Family Medicine 02/23/2020, 8:16 AM

## 2020-02-23 NOTE — Progress Notes (Signed)
MOB still desires to breast feed. DEBP set up for breast stimulation due to infant being in NICU. RN explained parts and how to clean. MOB stated her understanding.

## 2020-02-23 NOTE — Progress Notes (Signed)
Patient ID: [facility], female   DOB: 12-30-1995, 24 y.o.   MRN: [id number] Circumcision Counseling Progress Note  Patient desires circumcision for her female infant.  Circumcision procedure details discussed, risks and benefits of procedure were also discussed.  These include but are not limited to: Benefits of circumcision in men include reduction in the rates of urinary tract infection (UTI), penile cancer, some sexually transmitted infections, penile inflammatory and retractile disorders, as well as easier hygiene.  Risks include bleeding , infection, injury of glans which may lead to penile deformity or urinary tract issues, unsatisfactory cosmetic appearance and other potential complications related to the procedure.  It was emphasized that this is an elective procedure.  Patient wants to proceed with circumcision; written informed consent obtained.  Will do circumcision soon, routine circumcision and post circumcision care ordered for the infant.  Raelyn Mora, CNM  02/23/2020 10:00 AM

## 2020-02-24 ENCOUNTER — Ambulatory Visit: Payer: Self-pay

## 2020-02-24 DIAGNOSIS — Z30017 Encounter for initial prescription of implantable subdermal contraceptive: Secondary | ICD-10-CM

## 2020-02-24 MED ORDER — LISINOPRIL 5 MG PO TABS: 5.0000 mg | ORAL_TABLET | Freq: Every day | ORAL | 3 refills | Status: DC

## 2020-02-24 MED ORDER — ETONOGESTREL 68 MG ~~LOC~~ IMPL
68.0000 mg | DRUG_IMPLANT | Freq: Once | SUBCUTANEOUS | Status: AC
  Administered 2020-02-24: 68 mg via SUBCUTANEOUS
  Filled 2020-02-24: qty 1

## 2020-02-24 MED ORDER — LIDOCAINE HCL 1 % IJ SOLN
0.0000 mL | Freq: Once | INTRAMUSCULAR | Status: AC | PRN
  Administered 2020-02-24: 4 mL via INTRADERMAL
  Filled 2020-02-24: qty 20

## 2020-02-24 MED ORDER — LISINOPRIL 5 MG PO TABS
5.0000 mg | ORAL_TABLET | Freq: Once | ORAL | Status: AC
  Administered 2020-02-24: 5 mg via ORAL
  Filled 2020-02-24: qty 1

## 2020-02-24 MED ORDER — IBUPROFEN 600 MG PO TABS: 600.0000 mg | ORAL_TABLET | Freq: Three times a day (TID) | ORAL | 0 refills | Status: DC | PRN

## 2020-02-24 MED FILL — LISINOPRIL 5 MG TABS: 5 | 90 days supply | Qty: 90 | Fill #0

## 2020-02-24 MED FILL — IBUPROFEN 600 MG TABLET: 600 | 10 days supply | Qty: 30 | Fill #0

## 2020-02-24 NOTE — Procedures (Signed)
Discussed risks of the device including infection, bleeding, bruising, and pain after placement. Benefits include 3 years of reliable contraception. Urine pregnancy test was negative. The skin was marked 8 cm & 14 cm proximal to the medial epicondyle of the left upper arm and cleaned with alcohol then iodine swabs. 5 mL of 1% lidocaine with epinephrine (1.5 inch 25 gauge needle) was injected through the desired path for Nexplanon. The area was tested for numbness and then the Nexplanon was inserted at the 8 cm mark and the 14 cm mark used to guide direction of insertion. Bleeding was scant. Steri strips were placed at the insertion site and bandage was placed. The arm was wrapped with Coban and patient was instructed to keep site dry and wrapped overnight.  Salisa Broz Genene Churn, MD PGY-2 Resident Family Medicine 02/24/2020, 9:33 AM

## 2020-02-24 NOTE — Progress Notes (Signed)
Pt was in nicu with baby from 2200 until 0050.

## 2020-02-24 NOTE — Lactation Note (Signed)
This note was copied from a baby's chart. Lactation Consultation Note  Patient Name: Judith Terry XFGHW'E Date: 02/24/2020 Reason for consult: Follow-up assessment;NICU baby;Early term 37-38.6wks;Maternal endocrine disorder Type of Endocrine Disorder?: Diabetes  LC assisted Mom in positioning baby in cross cradle hold.  Baby 47 hrs old and showing cues.  Called to 5th floor RN to notify Mom that baby was ready to eat.  Baby able to breastfeed ad lib.  Mom desires to exclusively breastfeed.  She has been pumping frequently when not breastfeeding baby, and brought some EBM to NICU.  Baby STS and latches easily and deeply with nutritive sucks and swallowing noted.  Mom needing some guidance to support her breast and use of pillows for baby to be at the height of her breast.  Showed Mom how to compress breast during sucking bursts to increase milk transfer during feeding.  Swallows identified for Mom.    Encouraged Mom to feed baby until he is finished, burping and offering the 2nd breast with cues.   Mom to pump if baby receives supplement for any reason.  Mom has pump in her room, but she will be discharged today.  Mom states she plans to come directly to baby's room.  Showed Mom the [employer] DEBP in the corner.  Mom knows to take all her pump parts with her.  But the desire is to exclusively breastfeed.  Feeding Feeding Type: Breast Fed Nipple Type: Slow - flow  LATCH Score Latch: Grasps breast easily, tongue down, lips flanged, rhythmical sucking.  Audible Swallowing: Spontaneous and intermittent  Type of Nipple: Everted at rest and after stimulation  Comfort (Breast/Nipple): Soft / non-tender  Hold (Positioning): Assistance needed to correctly position infant at breast and maintain latch.  LATCH Score: 9  Interventions Interventions: Breast feeding basics reviewed;Assisted with latch;Skin to skin;Breast massage;Hand express;Breast compression;Adjust position;Support  pillows;Position options;Expressed milk;DEBP  Lactation Tools Discussed/Used Tools: Pump Breast pump type: Double-Electric Breast Pump   Consult Status Consult Status: Follow-up Date: 02/25/20 Follow-up type: In-patient    Judith Terry 02/24/2020, 10:53 AM

## 2020-02-24 NOTE — Lactation Note (Signed)
This note was copied from a baby's chart. Lactation Consultation Note  Patient Name: Judith Terry Today's Date: 02/24/2020  Attempted to see mom in room.  Already d/c.  Went to NICU to see if she had any questions regarding breastfeeding or pumping.   Trying to breastfeed on arrival.  Mom reports took left breast easily but always struggles on the right.  Infant fussy and frantic on right.  Mom calmed him with STS and we attempted again.  Did this for a few times and he latched and breastfed with a few audible swallows noted.  Fell asleep quickly.  Left him STS with mom.  Urged her to follow up with lactation as needed.  Maternal Data    Feeding Feeding Type: Breast Milk Nipple Type: Nfant Standard Flow (white)  LATCH Score                   Interventions    Lactation Tools Discussed/Used     Consult Status      Alene Bergerson Michaelle Copas 02/24/2020, 11:10 PM

## 2020-02-25 ENCOUNTER — Ambulatory Visit: Payer: Self-pay

## 2020-02-25 NOTE — Lactation Note (Signed)
This note was copied from a baby's chart. Lactation Consultation Note  Patient Name: Judith Terry PNDLO'P Date: 02/25/2020 Reason for consult: Follow-up assessment;NICU baby;Early term 37-38.6wks  0942 - 0957 - I followed up with Ms. Hoeg. She was pumping her right breast upon entry. I noted about 30 mls of pumped milk beside her, and she states that she pumped this from the left. She prefers to pump one breast at a time so she can have a hand free.  I brought her a belly band for the next session. I also recommended that she change to the expression phase now that she is pumping 20+ mls. I noted her flange was a bit snug, and I recommended trying the size 27 flange next time. Recommended double pumping with belly band next time.  Baby [name] was cueing while in room. Bottle feeding her EBM this session, but I offered to return to help with latch at the next feeding around 12-1. Asked the RN, Lequita Halt, to call, if possible.   Feeding Feeding Type: Breast Milk Nipple Type: Nfant Standard Flow (white)   Interventions Interventions: Breast feeding basics reviewed;DEBP  Lactation Tools Discussed/Used Flange Size: 24;27;Other (comment) (belly bands) Breast pump type: Double-Electric Breast Pump Pump Review: Setup, frequency, and cleaning   Consult Status Consult Status: Follow-up Date: 02/25/20 Follow-up type: In-patient    Walker Shadow 02/25/2020, 10:00 AM

## 2020-02-25 NOTE — Lactation Note (Signed)
This note was copied from a baby's chart. Lactation Consultation Note  Patient Name: Judith Terry XBWIO'M Date: 02/25/2020 Reason for consult: Follow-up assessment;Mother's request;NICU baby  I followed up with Ms. Vallee to assist with feeding her son, [name]. I assisted with latching him in cross cradle hold on the left breast. I showed Ms. Jago how to make a U hold and how to compress breast to assist with latching. Baby latched and breast fed for several minutes and then fell asleep.  Baby was not roused by gentle "pestering." I recommended Ms. Gayton post-pump.   Possible discharge tomorrow. I recommended that LC follow up in the morning.   Ms. Backer will be going to [facility] for follow up. I offered to put in a request for a follow up OP appointment there, and she agreed. I emailed a request for lactation OP.   Feeding Feeding Type: Breast Fed  LATCH Score Latch: Grasps breast easily, tongue down, lips flanged, rhythmical sucking.  Audible Swallowing: A few with stimulation  Type of Nipple: Everted at rest and after stimulation  Comfort (Breast/Nipple): Soft / non-tender  Hold (Positioning): Assistance needed to correctly position infant at breast and maintain latch.  LATCH Score: 8  Interventions Interventions: Breast feeding basics reviewed;Assisted with latch;Skin to skin;Hand express;Breast compression;Adjust position;Support pillows  Lactation Tools Discussed/Used     Consult Status Consult Status: Follow-up Date: 02/26/20 Follow-up type: In-patient    Walker Shadow 02/25/2020, 2:30 PM

## 2020-02-27 ENCOUNTER — Ambulatory Visit: Payer: Self-pay

## 2020-02-27 NOTE — Lactation Note (Signed)
This note was copied from a baby's chart. Lactation Consultation Note  Patient Name: Judith Terry GBTDV'V Date: 02/27/2020 Reason for consult: Follow-up assessment;Hyperbilirubinemia;NICU baby  [phone] -  Judith Terry to be discharged today. Has follow up at [facility] tomorrow (6/25). I put in a request for Judith Terry for lactation follow up. Baby has begun to gain well. Has hyperbilirubinemia, but levels are improving today.   Judith Terry states that baby has recently become fussy at the breast. He was latching well initially.  She has been providing bottles of EBM to baby instead but wants to get him back on the breast. She will try to get her WIC pump today. She is pumping good volume (120 mls/breast) and seeing mature milk.   I recommended breast feeding on demand 8-12 times a day. I recommended pumping anytime baby receives a bottle in lieu of breast. She prefers to single pump 10 minutes each side. She was able to review plan to pump every 2-3 hours during the day and every 3-4 hours at night. Milk storage guidelines reviewed.     Storage bottle provided. Judith Terry states that she is having difficulty reaching [facility] to obtain her DEBP upon discharge. I offered to help contact them.   Maternal Data Does the patient have breastfeeding experience prior to this delivery?: Yes  Feeding Feeding Type: Breast Milk Nipple Type: Nfant Standard Flow (white)   Interventions Interventions: Breast feeding basics reviewed  Lactation Tools Discussed/Used WIC Program: Yes Pump Review: Setup, frequency, and cleaning;Milk Storage   Consult Status Consult Status: Complete Date: 02/27/20 Follow-up type: In-patient    Judith Terry 02/27/2020, 9:28 AM

## 2020-02-27 NOTE — Lactation Note (Signed)
This note was copied from a baby's chart. Lactation Consultation Note  Patient Name: Judith Terry TOIZT'I Date: 02/27/2020 Reason for consult: Follow-up assessment  1015 - 1025 - I returned to room to assist with [facility] appointment. I called the [facility] hotline in [facility] and was able to put Ms. Levier in touch with them to help them make an appointment for discharge today. I also helped Ms. Smeltzer retrofit a belly band into a hands-free pumping bra and assisted her with pumping. Her milk has transitioned.  Maternal Data Does the patient have breastfeeding experience prior to this delivery?: Yes  Feeding Feeding Type: Breast Milk Nipple Type: Nfant Standard Flow (white)   Interventions Interventions: Breast feeding basics reviewed  Lactation Tools Discussed/Used WIC Program: Yes Pump Review: Setup, frequency, and cleaning;Milk Storage   Consult Status Consult Status: Complete Date: 02/27/20 Follow-up type: In-patient    [name] 02/27/2020, 11:26 AM

## 2020-02-28 ENCOUNTER — Encounter: Payer: Medicaid Other | Admitting: Obstetrics & Gynecology

## 2020-03-03 ENCOUNTER — Ambulatory Visit: Payer: Medicaid Other

## 2020-03-04 ENCOUNTER — Inpatient Hospital Stay (HOSPITAL_COMMUNITY): Admission: RE | Admit: 2020-03-04 | Payer: Medicaid Other | Source: Home / Self Care

## 2020-03-04 ENCOUNTER — Ambulatory Visit: Payer: Medicaid Other

## 2020-03-05 ENCOUNTER — Other Ambulatory Visit: Payer: Self-pay

## 2020-03-05 ENCOUNTER — Encounter: Payer: Self-pay | Admitting: *Deleted

## 2020-03-05 ENCOUNTER — Ambulatory Visit (INDEPENDENT_AMBULATORY_CARE_PROVIDER_SITE_OTHER): Payer: Medicaid Other | Admitting: *Deleted

## 2020-03-05 VITALS — BP 123/77 | HR 75 | Ht 68.0 in | Wt 248.0 lb

## 2020-03-05 DIAGNOSIS — Z0130 Encounter for examination of blood pressure without abnormal findings: Secondary | ICD-10-CM

## 2020-03-05 NOTE — Progress Notes (Signed)
Pt here for PP BP check - 123/77, P- 75. She denies H/A or visual disturbances. Pt has not been checking BP @ home due to her machine was found to not be functioning properly during office visit on 02/04/20. Pt was advised to notify is if she develops severe H/A or visual disturbances. If the office is closed, she should go to [facility] for evaluation. She voiced understanding of information and instructions given.

## 2020-03-06 ENCOUNTER — Encounter: Payer: Medicaid Other | Admitting: Family Medicine

## 2020-03-06 NOTE — Progress Notes (Signed)
Patient seen and assessed by nursing staff.  Agree with documentation and plan.  

## 2020-03-13 ENCOUNTER — Encounter: Payer: Medicaid Other | Admitting: Obstetrics & Gynecology

## 2020-03-26 ENCOUNTER — Other Ambulatory Visit: Payer: Self-pay

## 2020-03-26 ENCOUNTER — Ambulatory Visit (INDEPENDENT_AMBULATORY_CARE_PROVIDER_SITE_OTHER): Payer: Medicaid Other | Admitting: Obstetrics and Gynecology

## 2020-03-26 ENCOUNTER — Encounter: Payer: Self-pay | Admitting: Obstetrics and Gynecology

## 2020-03-26 DIAGNOSIS — O24439 Gestational diabetes mellitus in the puerperium, unspecified control: Secondary | ICD-10-CM

## 2020-03-26 NOTE — Progress Notes (Signed)
    Post Partum Visit Note  Judith Terry is a 24 y.o. G87P2002 female who presents for a postpartum visit. She is 4 weeks postpartum following a normal spontaneous vaginal delivery.  I have fully reviewed the prenatal and intrapartum course. The delivery was at 37/2 gestational weeks.  Anesthesia: IV sedation. Postpartum course has been uncomplicated. Baby is doing well. Baby is feeding by bottle - Carnation Good Start. Bleeding thin lochia. Bowel function is normal. Bladder function is normal. Patient is not sexually active. Contraception method is Nexplanon. Postpartum depression screening: negative.     The following portions of the patient's history were reviewed and updated as appropriate: allergies, current medications, past family history, past medical history, past social history, past surgical history and problem list.  Review of Systems A comprehensive review of systems was negative.    Objective:  unknown if currently breastfeeding.  General:  alert, cooperative and appears stated age   Breasts:  negative and not performed  Lungs: clear to auscultation bilaterally  Heart:  regular rate and rhythm  Abdomen: soft, non-tender; bowel sounds normal; no masses,  no organomegaly   Vulva:  not evaluated  Vagina: not evaluated  Cervix:  not evaluated  Corpus: not examined  Adnexa:  not evaluated  Rectal Exam: Not performed.        Assessment:    normal postpartum exam. Pap smear not done at today's visit.   Plan:   Essential components of care per ACOG recommendations:  1.  Mood and well being: Patient with positive depression screening today. Reviewed local resources for support.  - Patient does not use tobacco. If using tobacco we discussed reduction and for recently cessation risk of relapse - hx of drug use? No   If yes, discussed support systems  2. Infant care and feeding:  -Patient currently breastmilk feeding? No If breastmilk feeding discussed return to work and  pumping. If needed, patient was provided letter for work to allow for every 2-3 hr pumping breaks, and to be granted a private location to express breastmilk and refrigerated area to store breastmilk. Reviewed importance of draining breast regularly to support lactation. -Social determinants of health (SDOH) reviewed in EPIC. No concerns. 3. Sexuality, contraception and birth spacing - Patient does not want a pregnancy in the next year.  Desired family size is 4 children.  - Reviewed forms of contraception in tiered fashion. Patient desired nexplanon today.  Already placed in the hospital. - Discussed birth spacing of 18 months  4. Sleep and fatigue -Encouraged family/partner/community support of 4 hrs of uninterrupted sleep to help with mood and fatigue  5. Physical Recovery  - Discussed patients delivery and complications - Patient had no laceration, perineal healing reviewed. Patient expressed understanding - Patient has urinary incontinence? No - Patient is safe to resume physical and sexual activity  6.  Health Maintenance - Last pap smear done 08/2019 and was normal with negative HPV.   7. Gestational diabetes follow up Pt will get postpartum 2 hour GTT in 2 weeks - PCP follow up  Mariel Aloe, MD, [facility] for [facility], [facility]

## 2020-04-02 MED FILL — VALACYCLOVIR HCL 500 MG TAB: 500 | 30 days supply | Qty: 60 | Fill #1

## 2020-04-06 ENCOUNTER — Other Ambulatory Visit: Payer: Self-pay

## 2020-04-06 ENCOUNTER — Other Ambulatory Visit: Payer: Self-pay | Admitting: Podiatry

## 2020-04-06 ENCOUNTER — Ambulatory Visit (INDEPENDENT_AMBULATORY_CARE_PROVIDER_SITE_OTHER): Payer: Medicaid Other | Admitting: Podiatry

## 2020-04-06 DIAGNOSIS — M79674 Pain in right toe(s): Secondary | ICD-10-CM

## 2020-04-06 DIAGNOSIS — M79675 Pain in left toe(s): Secondary | ICD-10-CM

## 2020-04-06 DIAGNOSIS — B351 Tinea unguium: Secondary | ICD-10-CM

## 2020-04-06 MED ORDER — TERBINAFINE HCL 250 MG PO TABS
250.0000 mg | ORAL_TABLET | Freq: Every day | ORAL | 0 refills | Status: DC
Start: 2020-04-06 — End: 2020-04-06

## 2020-04-06 MED FILL — TERBINAFINE HCL 250 MG TABS: 250 | 30 days supply | Qty: 30 | Fill #0

## 2020-04-06 NOTE — Progress Notes (Signed)
  Subjective:  Patient ID: Judith Terry, female    DOB: [birth year],  MRN: [id number]  Chief Complaint  Patient presents with  . Nail Problem    Rt halux thick, discolored and loose -pt states nail never got bttter since she had it treated in 2017 -littl esorenss; 5/10 -no redness/welling/draiange Tx: none -pt also c/o blak spot on Lt halux x durring pregnancy course no injury /w redness per pt    24 y.o. female presents with the above complaint. History confirmed with patient.   Objective:  Physical Exam: warm, good capillary refill, no trophic changes or ulcerative lesions, normal DP and PT pulses and normal sensory exam. Left Foot: Hallux nail distal lateral superficial subungual onychomycosis Right Foot: Hallux nail completely mycotic with thickening onycholysis of the distal half, and severe nail dystrophy  Assessment:   1. Onychomycosis   2. Pain due to onychomycosis of toenails of both feet      Plan:  Patient was evaluated and treated and all questions answered.  Discussed the etiology and treatment options for the condition in detail with the patient. Educated patient on the topical and oral treatment options for mycotic nails. Recommended debridement of the nails today. Sharp and mechanical debridement performed of all painful and mycotic nails today. Nails debrided in length and thickness using a nail nipper and a mechanical burr to level of comfort. Discussed treatment options including appropriate shoe gear. Follow up as needed for painful nails.  She has a 4-month-old baby, however she is not currently breast-feeding.  We discussed multiple options for treatment of this including oral, topical, and laser therapy.  I recommended oral therapy.  Lamisil was prescribed to her pharmacy for 90-day course.  Return in about 3 months (around 07/07/2020) for nail re-check.

## 2020-04-09 ENCOUNTER — Other Ambulatory Visit: Payer: Medicaid Other

## 2020-04-09 ENCOUNTER — Other Ambulatory Visit: Payer: Self-pay

## 2020-04-09 DIAGNOSIS — O24415 Gestational diabetes mellitus in pregnancy, controlled by oral hypoglycemic drugs: Secondary | ICD-10-CM | POA: Diagnosis not present

## 2020-04-10 LAB — GLUCOSE TOLERANCE, 2 HOURS
Glucose, 2 hour: 81 mg/dL (ref 65–139)
Glucose, GTT - Fasting: 93 mg/dL (ref 65–99)

## 2020-04-29 MED FILL — busPIRone HCL 15 MG TABS: 15 | 30 days supply | Qty: 60 | Fill #1

## 2020-04-29 MED FILL — TERBINAFINE HCL 250 MG TABS: 250 | 30 days supply | Qty: 30 | Fill #1

## 2020-05-15 ENCOUNTER — Ambulatory Visit: Payer: Medicaid Other | Admitting: Family Medicine

## 2020-05-20 MED FILL — VALACYCLOVIR HCL 500 MG TAB: 500 | 30 days supply | Qty: 60 | Fill #2

## 2020-06-02 ENCOUNTER — Other Ambulatory Visit: Payer: Self-pay | Admitting: Family Medicine

## 2020-06-02 ENCOUNTER — Ambulatory Visit (INDEPENDENT_AMBULATORY_CARE_PROVIDER_SITE_OTHER): Payer: Medicaid Other | Admitting: Family Medicine

## 2020-06-02 ENCOUNTER — Other Ambulatory Visit: Payer: Self-pay

## 2020-06-02 ENCOUNTER — Encounter: Payer: Self-pay | Admitting: Family Medicine

## 2020-06-02 VITALS — BP 135/80 | HR 87 | Temp 97.9°F | Resp 17 | Ht 68.0 in | Wt 257.3 lb

## 2020-06-02 DIAGNOSIS — I10 Essential (primary) hypertension: Secondary | ICD-10-CM

## 2020-06-02 DIAGNOSIS — R519 Headache, unspecified: Secondary | ICD-10-CM | POA: Diagnosis not present

## 2020-06-02 DIAGNOSIS — Z09 Encounter for follow-up examination after completed treatment for conditions other than malignant neoplasm: Secondary | ICD-10-CM | POA: Diagnosis not present

## 2020-06-02 DIAGNOSIS — F411 Generalized anxiety disorder: Secondary | ICD-10-CM | POA: Diagnosis not present

## 2020-06-02 MED ORDER — BUSPIRONE HCL 15 MG PO TABS: 15.0000 mg | ORAL_TABLET | Freq: Two times a day (BID) | ORAL | 11 refills | Status: DC

## 2020-06-02 MED ORDER — LISINOPRIL 5 MG PO TABS: 5.0000 mg | ORAL_TABLET | Freq: Every day | ORAL | 3 refills | Status: DC

## 2020-06-02 MED FILL — busPIRone HCL 15 MG TABS: 15 | 30 days supply | Qty: 60 | Fill #0

## 2020-06-02 MED FILL — LISINOPRIL 5 MG TABLET: 5 | 90 days supply | Qty: 90 | Fill #0

## 2020-06-02 NOTE — Progress Notes (Signed)
[facility]   Established Patient Office Visit  Subjective:  Patient ID: Judith Terry, female    DOB: Aug 13, 1996  Age: 24 y.o. MRN: [id number]  CC:  Chief Complaint  Patient presents with  . Follow-up    Pt states no question or concerns to discuss today.    HPI Judith Terry is a 24 year old female who presents for Follow Up today.    Patient Active Problem List   Diagnosis Date Noted  . Encounter for routine postpartum follow-up 03/26/2020  . SVD (spontaneous vaginal delivery) 02/24/2020  . GBS (group B Streptococcus carrier), +RV culture, currently pregnant 02/21/2020  . Encounter for induction of labor 02/21/2020  . LGA (large for gestational age) fetus affecting management of mother 01/31/2020  . Migraine without aura and without status migrainosus, not intractable 01/31/2020  . Gestational diabetes mellitus (GDM) treated with oral hypoglycemic therapy 01/07/2020  . Supervision of high risk pregnancy, antepartum 08/27/2019  . History of pre-eclampsia 08/27/2019  . Obesity in pregnancy 08/27/2019  . Herpes 03/15/2019  . GERD (gastroesophageal reflux disease) 11/25/2014    Current Status: Since her last office visit, she is doing well with no complaints. She has recently given birth to her new son, [name] on 02/22/2020. She has had follow up with OB-GYN and baby has had follow up appointments as needed. She has had Nexplanon placed on 02/24/2020. She denies fevers, chills, fatigue, recent infections, weight loss, and night sweats. She has not had any headaches, visual changes, dizziness, and falls. No chest pain, heart palpitations, cough and shortness of breath reported. Denies GI problems such as nausea, vomiting, diarrhea, and constipation. She has no reports of blood in stools, dysuria and hematuria. No depression or anxiety, and denies suicidal ideations, homicidal ideations, or auditory hallucinations. She is taking all  medications as prescribed. She denies pain today.   Past Medical History:  Diagnosis Date  . Anxiety   . Chronic back pain   . GERD (gastroesophageal reflux disease)   . Headache   . Herpes   . Infection    UTI  . Muscle spasm   . Pregnancy induced hypertension   . Trichomoniasis     Past Surgical History:  Procedure Laterality Date  . NO PAST SURGERIES      Family History  Problem Relation Age of Onset  . Diabetes Mother   . Diabetes Father   . Heart disease Father   . Kidney failure Father     Social History   Socioeconomic History  . Marital status: Single    Spouse name: Not on file  . Number of children: Not on file  . Years of education: Not on file  . Highest education level: Not on file  Occupational History  . Not on file  Tobacco Use  . Smoking status: Never Smoker  . Smokeless tobacco: Never Used  Vaping Use  . Vaping Use: Never used  Substance and Sexual Activity  . Alcohol use: No  . Drug use: No  . Sexual activity: Yes    Birth control/protection: None  Other Topics Concern  . Not on file  Social History Narrative  . Not on file   Social Determinants of Health   Financial Resource Strain:   . Difficulty of Paying Living Expenses: Not on file  Food Insecurity: No Food Insecurity  . Worried About Programme researcher, broadcasting/film/video in the Last Year: Never true  . Ran Out of Food  in the Last Year: Never true  Transportation Needs: Unmet Transportation Needs  . Lack of Transportation (Medical): Yes  . Lack of Transportation (Non-Medical): No  Physical Activity:   . Days of Exercise per Week: Not on file  . Minutes of Exercise per Session: Not on file  Stress:   . Feeling of Stress : Not on file  Social Connections:   . Frequency of Communication with Friends and Family: Not on file  . Frequency of Social Gatherings with Friends and Family: Not on file  . Attends Religious Services: Not on file  . Active Member of Clubs or Organizations: Not on file    . Attends BankerClub or Organization Meetings: Not on file  . Marital Status: Not on file  Intimate Partner Violence:   . Fear of Current or Ex-Partner: Not on file  . Emotionally Abused: Not on file  . Physically Abused: Not on file  . Sexually Abused: Not on file    Outpatient Medications Prior to Visit  Medication Sig Dispense Refill  . acetaminophen (TYLENOL) 500 MG tablet Take 500 mg by mouth every 6 (six) hours as needed for mild pain or fever.     . Butalbital-APAP-Caffeine 50-325-40 MG capsule Take 1 capsule by mouth every 6 (six) hours as needed for headache. 30 capsule 0  . terbinafine (LAMISIL) 250 MG tablet Take 1 tablet (250 mg total) by mouth daily. 90 tablet 0  . valACYclovir (VALTREX) 500 MG tablet Take 1 tablet (500 mg total) by mouth 2 (two) times daily. 60 tablet 6  . busPIRone (BUSPAR) 15 MG tablet TAKE 1 TABLET (15 MG TOTAL) BY MOUTH 2 (TWO) TIMES DAILY. 60 tablet 3  . lisinopril (ZESTRIL) 5 MG tablet Take 1 tablet (5 mg total) by mouth daily. 90 tablet 3  . prenatal vitamin w/FE, FA (PRENATAL 1 + 1) 27-1 MG TABS tablet Take 1 tablet by mouth daily at 12 noon. (Patient not taking: Reported on 06/02/2020) 30 tablet 11   No facility-administered medications prior to visit.    No Known Allergies  ROS Review of Systems  Constitutional: Negative.   HENT: Negative.   Eyes: Negative.   Respiratory: Negative.   Cardiovascular: Negative.   Gastrointestinal: Positive for abdominal distention (post delivery ).  Endocrine: Negative.   Genitourinary: Negative.   Musculoskeletal: Negative.   Skin: Negative.   Allergic/Immunologic: Negative.   Neurological: Positive for headaches (occasional).  Hematological: Negative.   Psychiatric/Behavioral: Negative.       Objective:    Physical Exam Vitals and nursing note reviewed.  Constitutional:      Appearance: Normal appearance.  HENT:     Head: Normocephalic and atraumatic.     Nose: Nose normal.     Mouth/Throat:      Mouth: Mucous membranes are moist.     Pharynx: Oropharynx is clear.  Cardiovascular:     Rate and Rhythm: Normal rate.     Pulses: Normal pulses.     Heart sounds: Normal heart sounds.  Pulmonary:     Effort: Pulmonary effort is normal.     Breath sounds: Normal breath sounds.  Abdominal:     General: Bowel sounds are normal. There is distension (obese).     Palpations: Abdomen is soft.  Musculoskeletal:        General: Normal range of motion.     Cervical back: Normal range of motion and neck supple.  Skin:    General: Skin is warm and dry.  Neurological:  General: No focal deficit present.     Mental Status: She is alert and oriented to person, place, and time.  Psychiatric:        Mood and Affect: Mood normal.        Behavior: Behavior normal.        Thought Content: Thought content normal.        Judgment: Judgment normal.     BP 135/80 (BP Location: Left Arm, Patient Position: Sitting, Cuff Size: Large)   Pulse 87   Temp 97.9 F (36.6 C)   Resp 17   Ht 5\' 8"  (1.727 m)   Wt 257 lb 5.4 oz (116.7 kg)   LMP 04/27/2020 (Approximate)   SpO2 100%   BMI 39.13 kg/m  Wt Readings from Last 3 Encounters:  06/02/20 257 lb 5.4 oz (116.7 kg)  03/26/20 246 lb 6.4 oz (111.8 kg)  03/05/20 248 lb (112.5 kg)     Health Maintenance Due  Topic Date Due  . COVID-19 Vaccine (1) Never done  . INFLUENZA VACCINE  04/05/2020    There are no preventive care reminders to display for this patient.  Lab Results  Component Value Date   TSH 2.150 12/27/2017   Lab Results  Component Value Date   WBC 6.8 02/21/2020   HGB 12.2 02/21/2020   HCT 38.4 02/21/2020   MCV 101.6 (H) 02/21/2020   PLT 156 02/21/2020   Lab Results  Component Value Date   NA 138 02/21/2020   K 4.9 02/21/2020   CO2 19 (L) 02/21/2020   GLUCOSE 75 02/21/2020   BUN 11 02/21/2020   CREATININE 0.61 02/21/2020   BILITOT 0.7 02/21/2020   ALKPHOS 106 02/21/2020   AST 34 02/21/2020   ALT 19 02/21/2020    PROT 5.8 (L) 02/21/2020   ALBUMIN 2.5 (L) 02/21/2020   CALCIUM 9.0 02/21/2020   ANIONGAP 9 02/21/2020   No results found for: CHOL No results found for: HDL No results found for: LDLCALC No results found for: TRIG No results found for: [facility] Lab Results  Component Value Date   HGBA1C 5.0 11/13/2019      Assessment & Plan:   1. Hypertension, unspecified type The current medical regimen is effective; blood pressure is stable at 135/80 today; continue present plan and medications as prescribed. She will continue to take medications as prescribed, to decrease high sodium intake, excessive alcohol intake, increase potassium intake, smoking cessation, and increase physical activity of at least 30 minutes of cardio activity daily. She will continue to follow Heart Healthy or DASH diet. - lisinopril (ZESTRIL) 5 MG tablet; Take 1 tablet (5 mg total) by mouth daily.  Dispense: 90 tablet; Refill: 3  2. Generalized anxiety disorder - busPIRone (BUSPAR) 15 MG tablet; Take 1 tablet (15 mg total) by mouth 2 (two) times daily.  Dispense: 60 tablet; Refill: 11  3. Nonintractable headache, unspecified chronicity pattern, unspecified headache type  4. Follow up She will up in 6 months for Annual Physical.   Meds ordered this encounter  Medications  . busPIRone (BUSPAR) 15 MG tablet    Sig: Take 1 tablet (15 mg total) by mouth 2 (two) times daily.    Dispense:  60 tablet    Refill:  11  . lisinopril (ZESTRIL) 5 MG tablet    Sig: Take 1 tablet (5 mg total) by mouth daily.    Dispense:  90 tablet    Refill:  3    No orders of the defined types were placed  in this encounter.   Referral Orders  No referral(s) requested today    Raliegh Ip,  MSN, FNP-BC [facility] [address] [place], [place] [address] [phone] fax  Problem List Items Addressed This Visit    None      Visit Diagnoses    Hypertension, unspecified type    -  Primary   Relevant Medications   lisinopril (ZESTRIL) 5 MG tablet   Generalized anxiety disorder       Relevant Medications   busPIRone (BUSPAR) 15 MG tablet   Nonintractable headache, unspecified chronicity pattern, unspecified headache type       Follow up          Meds ordered this encounter  Medications  . busPIRone (BUSPAR) 15 MG tablet    Sig: Take 1 tablet (15 mg total) by mouth 2 (two) times daily.    Dispense:  60 tablet    Refill:  11  . lisinopril (ZESTRIL) 5 MG tablet    Sig: Take 1 tablet (5 mg total) by mouth daily.    Dispense:  90 tablet    Refill:  3    Follow-up: Return for Labs/OV.    Kallie Locks, FNP

## 2020-06-05 MED FILL — TERBINAFINE HCL 250 MG TABS: 250 | 30 days supply | Qty: 30 | Fill #2

## 2020-07-03 ENCOUNTER — Other Ambulatory Visit: Payer: Self-pay | Admitting: Physician Assistant

## 2020-07-03 DIAGNOSIS — Z3480 Encounter for supervision of other normal pregnancy, unspecified trimester: Secondary | ICD-10-CM

## 2020-07-07 MED FILL — VALACYCLOVIR HCL 500 MG TAB: 500 | 30 days supply | Qty: 60 | Fill #3

## 2020-07-08 ENCOUNTER — Encounter: Payer: Self-pay | Admitting: Family Medicine

## 2020-07-08 ENCOUNTER — Other Ambulatory Visit: Payer: Self-pay | Admitting: Physician Assistant

## 2020-07-08 ENCOUNTER — Other Ambulatory Visit: Payer: Self-pay

## 2020-07-08 ENCOUNTER — Telehealth: Payer: Self-pay | Admitting: Family Medicine

## 2020-07-08 DIAGNOSIS — Z3480 Encounter for supervision of other normal pregnancy, unspecified trimester: Secondary | ICD-10-CM

## 2020-07-08 MED ORDER — BUTALBITAL-APAP-CAFFEINE 50-325-40 MG PO CAPS: 1.0000 | ORAL_CAPSULE | Freq: Four times a day (QID) | ORAL | 0 refills | Status: DC | PRN

## 2020-07-08 MED FILL — BUTALB-ACETAMIN-CAFF 50-325: 50-325-40 | 8 days supply | Qty: 30 | Fill #0

## 2020-07-09 ENCOUNTER — Ambulatory Visit: Payer: Medicaid Other | Admitting: Podiatry

## 2020-07-09 NOTE — Telephone Encounter (Signed)
Done

## 2020-08-10 MED FILL — busPIRone HCL 15 MG TABS: 15 | 30 days supply | Qty: 60 | Fill #1

## 2020-08-17 ENCOUNTER — Other Ambulatory Visit: Payer: Self-pay

## 2020-08-17 ENCOUNTER — Other Ambulatory Visit: Payer: Self-pay | Admitting: Podiatry

## 2020-08-17 ENCOUNTER — Ambulatory Visit (INDEPENDENT_AMBULATORY_CARE_PROVIDER_SITE_OTHER): Payer: Medicaid Other | Admitting: Podiatry

## 2020-08-17 DIAGNOSIS — B351 Tinea unguium: Secondary | ICD-10-CM | POA: Diagnosis not present

## 2020-08-17 MED ORDER — FLUCONAZOLE 150 MG PO TABS: 150.0000 mg | ORAL_TABLET | ORAL | 2 refills | Status: DC

## 2020-08-17 MED FILL — FLUCONAZOLE 150 MG TABLET: 150 | 28 days supply | Qty: 4 | Fill #0

## 2020-08-17 NOTE — Addendum Note (Signed)
Addended by: Dinah Beers on: 08/17/2020 10:31 AM   Modules accepted: Orders

## 2020-08-17 NOTE — Progress Notes (Signed)
  Subjective:  Patient ID: Judith Terry, female    DOB: 14-Mar-1996,  MRN: [id number]  Chief Complaint  Patient presents with  . Nail Problem    F/U BL nail fungus Pt states," little improvement." - w/ minor pain at Rt hallux -no redness/swelling Tx: lamisil (complete and no reaction to Rx)    24 y.o. female presents with the above complaint. History confirmed with patient.   Objective:  Physical Exam: warm, good capillary refill, no trophic changes or ulcerative lesions, normal DP and PT pulses and normal sensory exam. Left Foot: Hallux nail distal lateral superficial subungual onychomycosis Right Foot: Hallux nail completely mycotic with thickening onycholysis of the distal half, and severe nail dystrophy  Assessment:   1. Onychomycosis      Plan:  Patient was evaluated and treated and all questions answered.  Onychomycosis -Educated on etiology of nail fungus. -Nail sample taken for microbiology -Rx fluconazole weekly -Discussed right hallux may never return to normal.  Return in about 3 months (around 11/15/2020) for Nail Fungus.

## 2020-08-20 MED FILL — CYCLOBENZAPRINE 10 MG TAB: 10 | 10 days supply | Qty: 30 | Fill #1

## 2020-09-03 MED FILL — CYCLOBENZAPRINE 5 MG TABLET: 5 | 10 days supply | Qty: 30 | Fill #0

## 2020-09-09 MED FILL — FLUCONAZOLE 150 MG TABLET: 150 | 28 days supply | Qty: 4 | Fill #1

## 2020-09-18 ENCOUNTER — Telehealth: Payer: Self-pay

## 2020-09-18 LAB — HOUSE ACCOUNT TRACKING

## 2020-09-18 LAB — CULTURE, FUNGUS WITHOUT SMEAR
MICRO NUMBER:: 11323104
SPECIMEN QUALITY:: ADEQUATE

## 2020-09-18 NOTE — Addendum Note (Signed)
Addended by: Dinah Beers on: 09/18/2020 11:18 AM   Modules accepted: Orders

## 2020-09-18 NOTE — Telephone Encounter (Signed)
Contacted [facility] and spoke with Sherrilyn Rist and gave out account number with Doctor's NPI in order to process nail culture

## 2020-10-16 MED FILL — BUTALB-ACETAMIN-CAFF 50-325: 50-325-40 | 4 days supply | Qty: 30 | Fill #0

## 2020-10-16 MED FILL — FLUCONAZOLE 150 MG TABLET: 150 | 28 days supply | Qty: 4 | Fill #2

## 2020-10-26 MED FILL — VALACYCLOVIR HCL 500 MG TAB: 500 | 30 days supply | Qty: 60 | Fill #4

## 2020-10-26 MED FILL — busPIRone HCL 15 MG TABS: 15 | 30 days supply | Qty: 60 | Fill #2

## 2020-10-26 MED FILL — LISINOPRIL 5 MG TABLET: 5 | 90 days supply | Qty: 90 | Fill #1

## 2020-11-16 ENCOUNTER — Other Ambulatory Visit: Payer: Self-pay

## 2020-11-16 ENCOUNTER — Ambulatory Visit: Payer: Medicaid Other | Admitting: Podiatry

## 2020-11-16 DIAGNOSIS — L603 Nail dystrophy: Secondary | ICD-10-CM

## 2020-11-16 DIAGNOSIS — B351 Tinea unguium: Secondary | ICD-10-CM

## 2020-11-16 NOTE — Progress Notes (Signed)
  Subjective:  Patient ID: Judith Terry, female    DOB: Sep 13, 1995,  MRN: [id number]  Chief Complaint  Patient presents with  . Results    Review nail cx results  . Nail Problem    F/U Nail fungus -pt states," it looks like the nails is growing back a little (rt) ." - no pain Tx: fluconazole (no reaction)     25 y.o. female presents with the above complaint. History confirmed with patient.   Objective:  Physical Exam: warm, good capillary refill, no trophic changes or ulcerative lesions, normal DP and PT pulses and normal sensory exam. Left Foot: Hallux nail distal lateral superficial subungual onychomycosis Right Foot: Nail appears improved with proximal clearing, distal mycosis  Assessment:   1. Onychomycosis   2. Nail dystrophy    Plan:  Patient was evaluated and treated and all questions answered.  Onychomycosis -Reviewed nail culture - Penicillium species. Unlikely pathologic. No further therapy indicated.  -F/u PRN. Would consider nail excision if unhappy with appearance.  Return if symptoms worsen or fail to improve.

## 2020-11-30 ENCOUNTER — Encounter: Payer: Self-pay | Admitting: Family Medicine

## 2020-11-30 ENCOUNTER — Other Ambulatory Visit: Payer: Self-pay

## 2020-11-30 ENCOUNTER — Ambulatory Visit (INDEPENDENT_AMBULATORY_CARE_PROVIDER_SITE_OTHER): Payer: Medicaid Other | Admitting: Family Medicine

## 2020-11-30 VITALS — BP 132/83 | HR 85 | Temp 97.8°F | Ht 68.0 in | Wt 284.0 lb

## 2020-11-30 DIAGNOSIS — Z0000 Encounter for general adult medical examination without abnormal findings: Secondary | ICD-10-CM | POA: Diagnosis not present

## 2020-11-30 DIAGNOSIS — I10 Essential (primary) hypertension: Secondary | ICD-10-CM

## 2020-11-30 DIAGNOSIS — R234 Changes in skin texture: Secondary | ICD-10-CM

## 2020-11-30 DIAGNOSIS — R239 Unspecified skin changes: Secondary | ICD-10-CM

## 2020-11-30 DIAGNOSIS — Z3200 Encounter for pregnancy test, result unknown: Secondary | ICD-10-CM

## 2020-11-30 DIAGNOSIS — Z09 Encounter for follow-up examination after completed treatment for conditions other than malignant neoplasm: Secondary | ICD-10-CM | POA: Diagnosis not present

## 2020-11-30 LAB — POCT URINALYSIS DIPSTICK
Bilirubin, UA: NEGATIVE
Blood, UA: NEGATIVE
Glucose, UA: NEGATIVE
Ketones, UA: NEGATIVE
Nitrite, UA: NEGATIVE
Protein, UA: NEGATIVE
Spec Grav, UA: 1.01 (ref 1.010–1.025)
Urobilinogen, UA: 0.2 E.U./dL
pH, UA: 5.5 (ref 5.0–8.0)

## 2020-11-30 LAB — POCT URINE PREGNANCY: Preg Test, Ur: NEGATIVE

## 2020-11-30 NOTE — Progress Notes (Signed)
[facility]    Annual Physical   Subjective:  [facility]    DOB: 08/11/1996  Age: 25 y.o. MRN: [id number]  CC:  Chief Complaint  Patient presents with  . Annual Exam    Physical, nausea  in morning  , wants if has anything to do with her dm     HPI Judith Terry is a 25 year old female who presents for Annual Physical today.    Patient Active Problem List   Diagnosis Date Noted  . Encounter for routine postpartum follow-up 03/26/2020  . SVD (spontaneous vaginal delivery) 02/24/2020  . GBS (group B Streptococcus carrier), +RV culture, currently pregnant 02/21/2020  . Encounter for induction of labor 02/21/2020  . LGA (large for gestational age) fetus affecting management of mother 01/31/2020  . Migraine without aura and without status migrainosus, not intractable 01/31/2020  . Gestational diabetes mellitus (GDM) treated with oral hypoglycemic therapy 01/07/2020  . Supervision of high risk pregnancy, antepartum 08/27/2019  . History of pre-eclampsia 08/27/2019  . Obesity in pregnancy 08/27/2019  . Herpes 03/15/2019  . GERD (gastroesophageal reflux disease) 11/25/2014    Current Status: Since her last office visit, Judith is doing well with no complaints. Judith denies fevers, chills, fatigue, recent infections, weight loss, and night sweats. Judith has not had any headaches, visual changes, dizziness, and falls. No chest pain, heart palpitations, cough and shortness of breath reported. Denies GI problems such as nausea, vomiting, diarrhea, and constipation. He has no reports of blood in stools, dysuria and hematuria. No depression or anxiety, and denies suicidal ideations, homicidal ideations, or auditory hallucinations. Judith is taking all medications as prescribed. Judith denies pain today.   Past Medical History:  Diagnosis Date  . Anxiety   . Chronic back pain   . GERD (gastroesophageal reflux disease)   .  Headache   . Herpes   . Infection    UTI  . Muscle spasm   . Pregnancy induced hypertension   . Trichomoniasis     Past Surgical History:  Procedure Laterality Date  . NO PAST SURGERIES      Family History  Problem Relation Age of Onset  . Diabetes Mother   . Diabetes Father   . Heart disease Father   . Kidney failure Father     Social History   Socioeconomic History  . Marital status: Single    Spouse name: Not on file  . Number of children: Not on file  . Years of education: Not on file  . Highest education level: Not on file  Occupational History  . Not on file  Tobacco Use  . Smoking status: Never Smoker  . Smokeless tobacco: Never Used  Vaping Use  . Vaping Use: Never used  Substance and Sexual Activity  . Alcohol use: No  . Drug use: No  . Sexual activity: Yes    Birth control/protection: None  Other Topics Concern  . Not on file  Social History Narrative  . Not on file   Social Determinants of Health   Financial Resource Strain: Not on file  Food Insecurity: No Food Insecurity  . Worried About Programme researcher, broadcasting/film/videounning Out of Food in the Last Year: Never true  . Ran Out of Food in the Last Year: Never true  Transportation Needs: Unmet Transportation Needs  . Lack of Transportation (Medical): Yes  . Lack of Transportation (Non-Medical): No  Physical Activity: Not on file  Stress:  Not on file  Social Connections: Not on file  Intimate Partner Violence: Not on file    Outpatient Medications Prior to Visit  Medication Sig Dispense Refill  . acetaminophen (TYLENOL) 500 MG tablet Take 500 mg by mouth every 6 (six) hours as Terry for mild pain or fever.     . busPIRone (BUSPAR) 15 MG tablet Take 1 tablet (15 mg total) by mouth 2 (two) times daily. 60 tablet 11  . Butalbital-APAP-Caffeine 50-325-40 MG capsule Take 1 capsule by mouth every 6 (six) hours as Terry for headache. 30 capsule 0  . lisinopril (ZESTRIL) 5 MG tablet Take 1 tablet (5 mg total) by mouth daily.  90 tablet 3  . valACYclovir (VALTREX) 500 MG tablet Take 1 tablet (500 mg total) by mouth 2 (two) times daily. 60 tablet 6  . prenatal vitamin w/FE, FA (PRENATAL 1 + 1) 27-1 MG TABS tablet Take 1 tablet by mouth daily at 12 noon. 30 tablet 11  . fluconazole (DIFLUCAN) 150 MG tablet Take 1 tablet (150 mg total) by mouth once a week. 4 tablet 2   No facility-administered medications prior to visit.    No Known Allergies  ROS Review of Systems  Constitutional: Negative.   HENT: Negative.   Eyes: Negative.   Respiratory: Negative.   Cardiovascular: Negative.   Gastrointestinal: Positive for abdominal distention.  Endocrine: Negative.   Genitourinary: Negative.   Musculoskeletal: Negative.   Skin: Negative.   Allergic/Immunologic: Negative.   Neurological: Negative.   Hematological: Negative.   Psychiatric/Behavioral: Negative.       Objective:    Physical Exam Vitals and nursing note reviewed.  Constitutional:      Appearance: Normal appearance.  HENT:     Head: Normocephalic and atraumatic.     Nose: Nose normal.     Mouth/Throat:     Mouth: Mucous membranes are moist.     Pharynx: Oropharynx is clear.  Cardiovascular:     Rate and Rhythm: Normal rate and regular rhythm.     Pulses: Normal pulses.     Heart sounds: Normal heart sounds.  Pulmonary:     Effort: Pulmonary effort is normal.     Breath sounds: Normal breath sounds.  Abdominal:     General: Bowel sounds are normal.     Palpations: Abdomen is soft.  Musculoskeletal:        General: Normal range of motion.     Cervical back: Normal range of motion and neck supple.  Skin:    General: Skin is warm and dry.  Neurological:     General: No focal deficit present.     Mental Status: Judith is alert and oriented to person, place, and time.  Psychiatric:        Mood and Affect: Mood normal.        Behavior: Behavior normal.        Thought Content: Thought content normal.        Judgment: Judgment normal.     BP 132/83 (BP Location: Left Arm, Patient Position: Sitting, Cuff Size: Large)   Pulse 85   Temp 97.8 F (36.6 C) (Temporal)   Ht 5\' 8"  (1.727 m)   Wt 284 lb (128.8 kg)   SpO2 98%   BMI 43.18 kg/m  Wt Readings from Last 3 Encounters:  11/30/20 284 lb (128.8 kg)  06/02/20 257 lb 5.4 oz (116.7 kg)  03/26/20 246 lb 6.4 oz (111.8 kg)     Health Maintenance Due  Topic Date  Due  . COVID-19 Vaccine (1) Never done  . HPV VACCINES (2 - 3-dose series) 08/07/2015  . INFLUENZA VACCINE  04/05/2020       Topic Date Due  . HPV VACCINES (2 - 3-dose series) 08/07/2015    Lab Results  Component Value Date   TSH 1.500 11/30/2020   Lab Results  Component Value Date   WBC 8.7 11/30/2020   HGB 13.7 11/30/2020   HCT 41.4 11/30/2020   MCV 96 11/30/2020   PLT 241 11/30/2020   Lab Results  Component Value Date   NA 138 02/21/2020   K 4.9 02/21/2020   CO2 19 (L) 02/21/2020   GLUCOSE 75 02/21/2020   BUN 11 02/21/2020   CREATININE 0.61 02/21/2020   BILITOT 0.7 02/21/2020   ALKPHOS 106 02/21/2020   AST 34 02/21/2020   ALT 19 02/21/2020   PROT 5.8 (L) 02/21/2020   ALBUMIN 2.5 (L) 02/21/2020   CALCIUM 9.0 02/21/2020   ANIONGAP 9 02/21/2020   No results found for: CHOL No results found for: HDL No results found for: LDLCALC No results found for: TRIG No results found for: [facility] Lab Results  Component Value Date   HGBA1C 5.0 11/13/2019      Assessment & Plan:   1. Annual physical exam Physical assessment within normal for age. Basic Neurology assessment normal.   Recommend monthly self breast exam Recommend daily multivitamin for women Recommend strength training in 150 minutes of cardiovascular exercise per week  2. Hypertension, unspecified type The current medical regimen is effective; blood pressure is stable at 132/83 today; continue present plan and medications as prescribed. He will continue to take medications as prescribed, to decrease high sodium intake,  excessive alcohol intake, increase potassium intake, smoking cessation, and increase physical activity of at least 30 minutes of cardio activity daily. He will continue to follow Heart Healthy or DASH diet. - POCT Urinalysis Dipstick  3. Possible pregnancy Negative pregnancy test.  - POCT urine pregnancy  4. Breast skin changes - Ambulatory referral to Dermatology  5. Skin change - Ambulatory referral to Dermatology  6. Healthcare maintenance - CBC with Differential - Comprehensive metabolic panel; Future - TSH - Comprehensive metabolic panel  7. Follow up Judith Terry.   No orders of the defined types were placed in this encounter.   Orders Placed This Encounter  Procedures  . CBC with Differential  . Comprehensive metabolic panel  . TSH  . Ambulatory referral to Dermatology  . POCT Urinalysis Dipstick  . POCT urine pregnancy     Referral Orders     Ambulatory referral to Dermatology   Raliegh Ip, MSN, ANE, FNP-BC [facility] [address] [place], [place] [address] [phone] fax   Problem List Items Addressed This Visit   None   Visit Diagnoses    Annual physical exam    -  Primary   Hypertension, unspecified type       Relevant Orders   POCT Urinalysis Dipstick (Completed)   Possible pregnancy       Relevant Orders   POCT urine pregnancy (Completed)   Breast skin changes       Relevant Orders   Ambulatory referral to Dermatology   Skin change       Relevant Orders   Ambulatory referral to Dermatology   Healthcare maintenance       Relevant Orders   CBC with Differential (  Completed)   Comprehensive metabolic panel   TSH (Completed)   Follow up          No orders of the defined types were placed in this encounter.   Follow-up: No follow-ups on file.    Judith Locks, FNP

## 2020-12-01 LAB — CBC WITH DIFFERENTIAL/PLATELET
Basophils Absolute: 0 10*3/uL (ref 0.0–0.2)
Basos: 0 %
EOS (ABSOLUTE): 0.2 10*3/uL (ref 0.0–0.4)
Eos: 2 %
Hematocrit: 41.4 % (ref 34.0–46.6)
Hemoglobin: 13.7 g/dL (ref 11.1–15.9)
Immature Grans (Abs): 0 10*3/uL (ref 0.0–0.1)
Immature Granulocytes: 0 %
Lymphocytes Absolute: 2.5 10*3/uL (ref 0.7–3.1)
Lymphs: 29 %
MCH: 31.6 pg (ref 26.6–33.0)
MCHC: 33.1 g/dL (ref 31.5–35.7)
MCV: 96 fL (ref 79–97)
Monocytes Absolute: 0.5 10*3/uL (ref 0.1–0.9)
Monocytes: 5 %
Neutrophils Absolute: 5.4 10*3/uL (ref 1.4–7.0)
Neutrophils: 64 %
Platelets: 241 10*3/uL (ref 150–450)
RBC: 4.33 x10E6/uL (ref 3.77–5.28)
RDW: 12 % (ref 11.7–15.4)
WBC: 8.7 10*3/uL (ref 3.4–10.8)

## 2020-12-01 LAB — TSH: TSH: 1.5 u[IU]/mL (ref 0.450–4.500)

## 2020-12-02 ENCOUNTER — Encounter: Payer: Self-pay | Admitting: Family Medicine

## 2020-12-05 ENCOUNTER — Other Ambulatory Visit: Payer: Self-pay

## 2020-12-09 ENCOUNTER — Encounter: Payer: Self-pay | Admitting: Family Medicine

## 2020-12-09 ENCOUNTER — Other Ambulatory Visit: Payer: Self-pay

## 2020-12-09 ENCOUNTER — Telehealth (INDEPENDENT_AMBULATORY_CARE_PROVIDER_SITE_OTHER): Payer: Medicaid Other | Admitting: Family Medicine

## 2020-12-09 DIAGNOSIS — K219 Gastro-esophageal reflux disease without esophagitis: Secondary | ICD-10-CM | POA: Diagnosis not present

## 2020-12-09 DIAGNOSIS — Z09 Encounter for follow-up examination after completed treatment for conditions other than malignant neoplasm: Secondary | ICD-10-CM | POA: Diagnosis not present

## 2020-12-09 DIAGNOSIS — R059 Cough, unspecified: Secondary | ICD-10-CM

## 2020-12-09 LAB — COMPREHENSIVE METABOLIC PANEL

## 2020-12-09 MED ORDER — OMEPRAZOLE 40 MG PO CPDR
40.0000 mg | DELAYED_RELEASE_CAPSULE | ORAL | 6 refills | Status: DC | PRN
  Filled 2020-12-09: qty 30, 30d supply, fill #0

## 2020-12-09 NOTE — Progress Notes (Signed)
Virtual Visit via Telephone Note  I connected with [facility] on 12/15/20 at  3:00 PM EDT by telephone and verified that I am speaking with the correct person using two identifiers.  Location: Patient: Home Provider: Office   I discussed the limitations, risks, security and privacy concerns of performing an evaluation and management service by telephone and the availability of in person appointments. I also discussed with the patient that there may be a patient responsible charge related to this service. The patient expressed understanding and agreed to proceed.   History of Present Illness:  Patient Active Problem List   Diagnosis Date Noted  . Encounter for routine postpartum follow-up 03/26/2020  . SVD (spontaneous vaginal delivery) 02/24/2020  . GBS (group B Streptococcus carrier), +RV culture, currently pregnant 02/21/2020  . Encounter for induction of labor 02/21/2020  . LGA (large for gestational age) fetus affecting management of mother 01/31/2020  . Migraine without aura and without status migrainosus, not intractable 01/31/2020  . Gestational diabetes mellitus (GDM) treated with oral hypoglycemic therapy 01/07/2020  . Supervision of high risk pregnancy, antepartum 08/27/2019  . History of pre-eclampsia 08/27/2019  . Obesity in pregnancy 08/27/2019  . Herpes 03/15/2019  . GERD (gastroesophageal reflux disease) 11/25/2014   Current Status: Since her last office visit, she has c/o nausea/vomiting and cough X 1 week now. She denies fevers, chills, fatigue, recent infections, weight loss, and night sweats. She has not had any headaches, visual changes, dizziness, and falls. No chest pain, heart palpitations, and shortness of breath reported. Denies GI problems such as diarrhea, and constipation. She has no reports of blood in stools, dysuria and hematuria. No depression or anxiety reported today. She is taking all medications as prescribed. She denies pain today.      Observations/Objective:  Telephone Virtual Visit   Assessment and Plan:  1. Cough - omeprazole (PRILOSEC) 40 MG capsule; Take 1 capsule (40 mg total) by mouth as needed.  Dispense: 30 capsule; Refill: 6  2. Gastroesophageal reflux disease without esophagitis - omeprazole (PRILOSEC) 40 MG capsule; Take 1 capsule (40 mg total) by mouth as needed.  Dispense: 30 capsule; Refill: 6  3. Follow up She will keep follow up appointment.   Meds ordered this encounter  Medications  . omeprazole (PRILOSEC) 40 MG capsule    Sig: Take 1 capsule (40 mg total) by mouth as needed.    Dispense:  30 capsule    Refill:  6    No orders of the defined types were placed in this encounter.   Referral Orders  No referral(s) requested today   Raliegh Ip, MSN, ANE, FNP-BC [facility] [address] [place], [place] [address] [phone] fax    I discussed the assessment and treatment plan with the patient. The patient was provided an opportunity to ask questions and all were answered. The patient agreed with the plan and demonstrated an understanding of the instructions.   The patient was advised to call back or seek an in-person evaluation if the symptoms worsen or if the condition fails to improve as anticipated.  I provided 20 minutes of non-face-to-face time during this encounter.   Kallie Locks, FNP

## 2020-12-10 ENCOUNTER — Other Ambulatory Visit: Payer: Self-pay

## 2020-12-13 ENCOUNTER — Encounter: Payer: Self-pay | Admitting: Family Medicine

## 2020-12-15 ENCOUNTER — Encounter: Payer: Self-pay | Admitting: Family Medicine

## 2020-12-31 IMAGING — US US MFM OB FOLLOW-UP
1 series · 14 of 28 positions shown · non-contrast
Comparison: none

[Series 1: us mfm ob follow-up · 35 acquisitions, 14 frames shown]
[im 2/35]
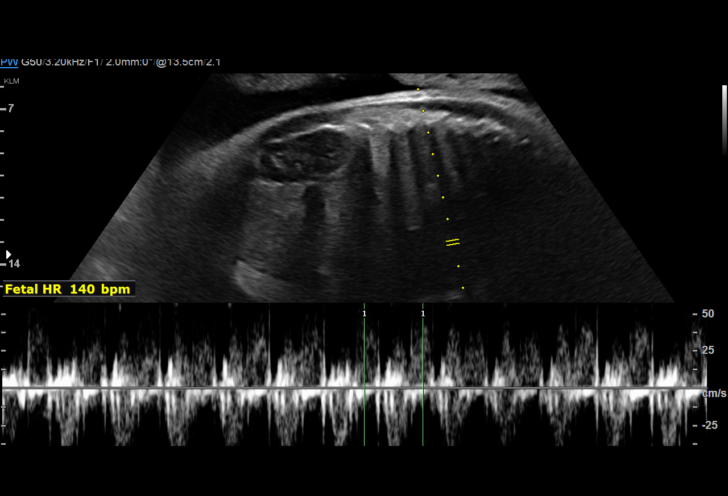
[im 4/35]
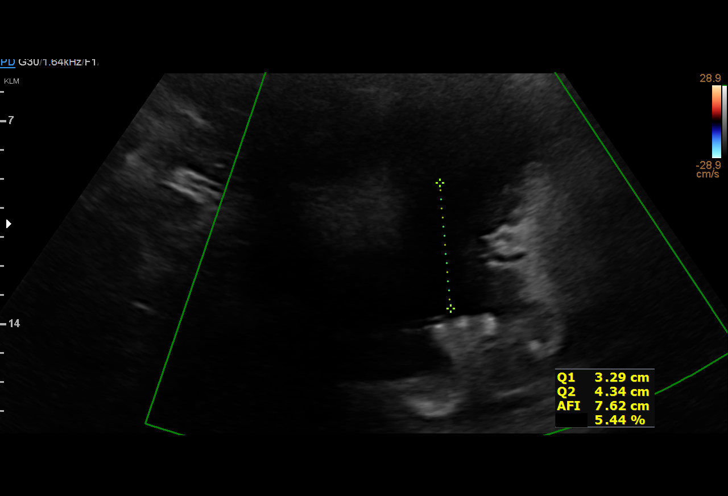
[im 7/35]
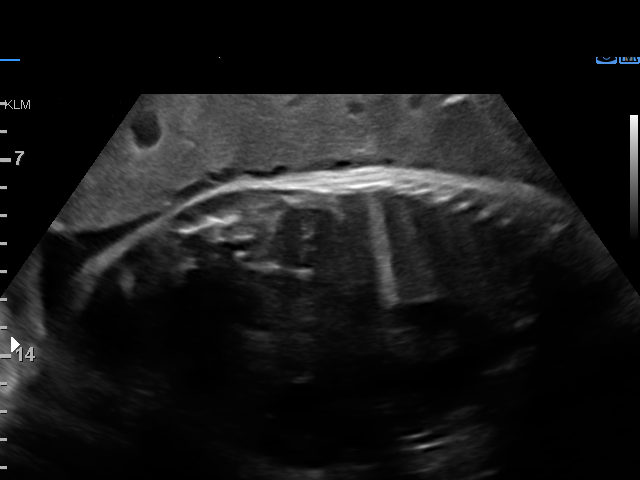
[im 9/35]
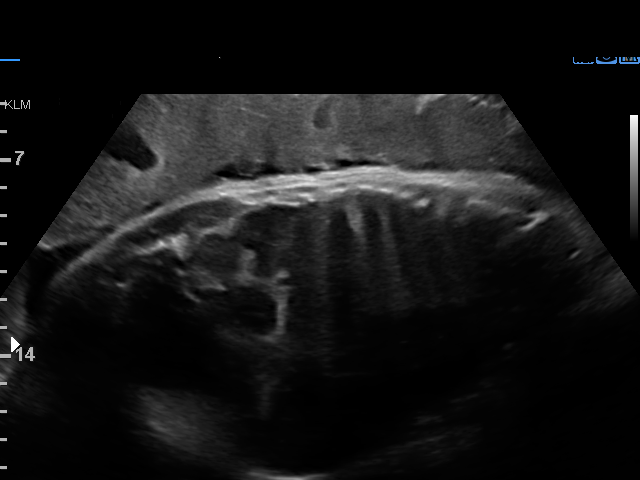
[im 12/35]
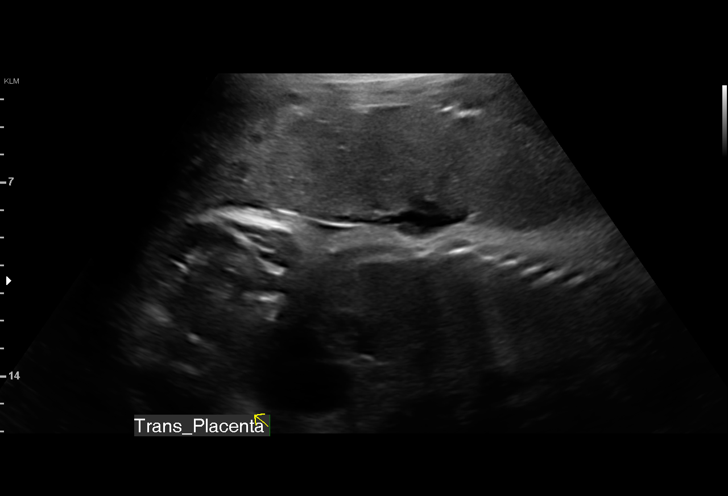
[im 14/35]
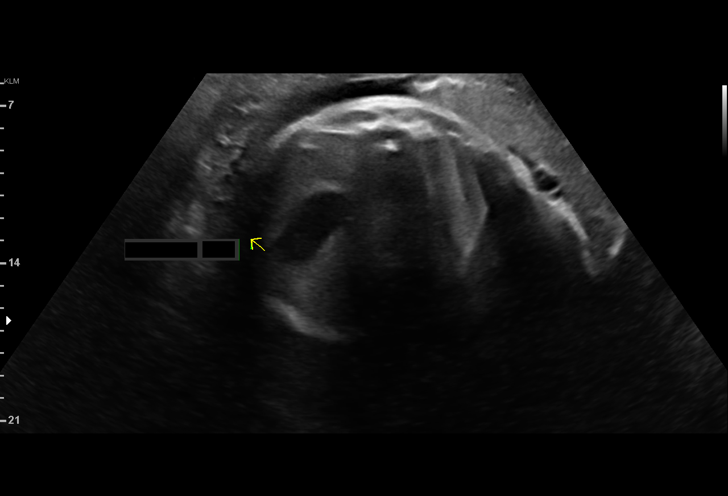
[im 17/35]
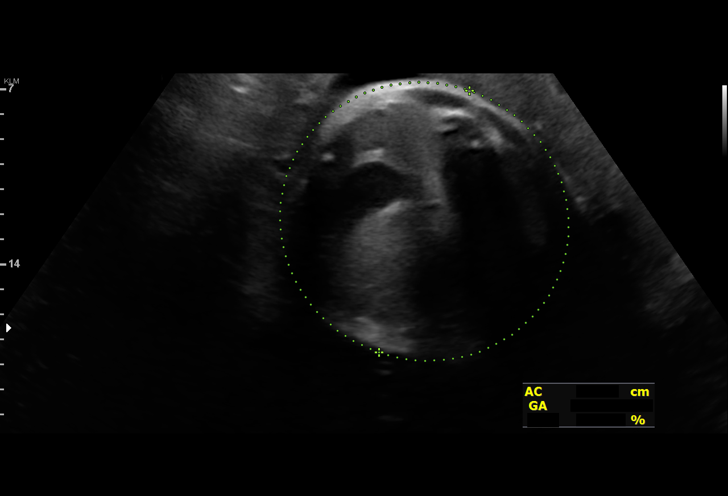
[im 19/35]
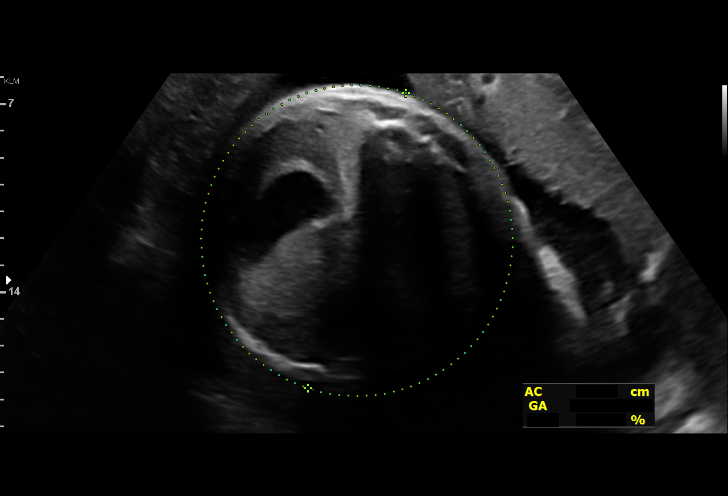
[im 22/35]
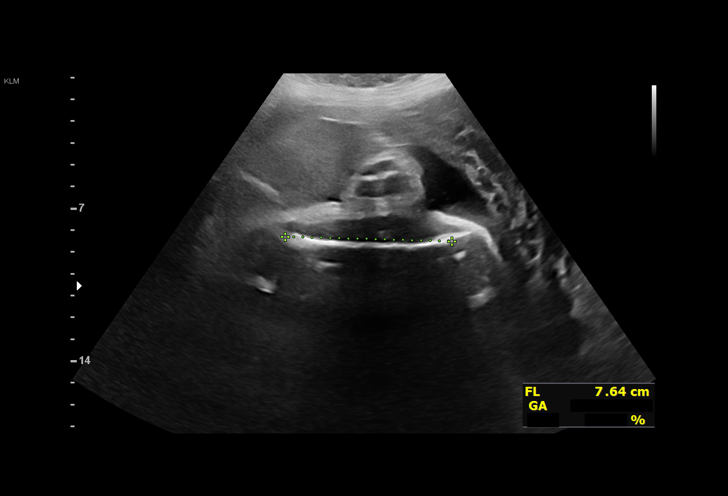
[im 24/35]
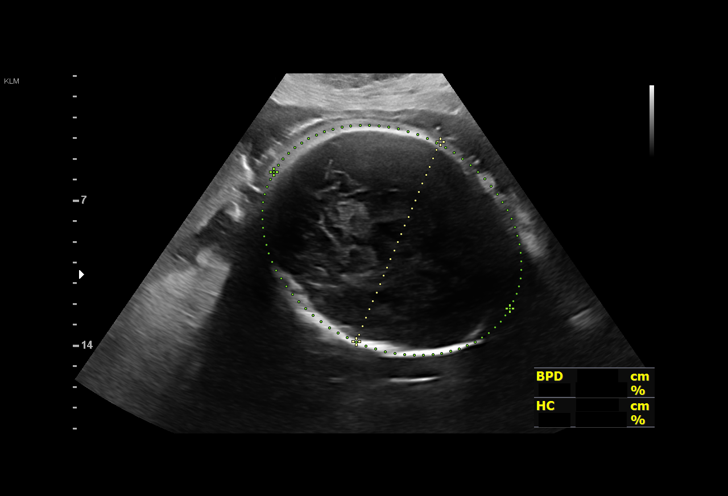
[im 27/35]
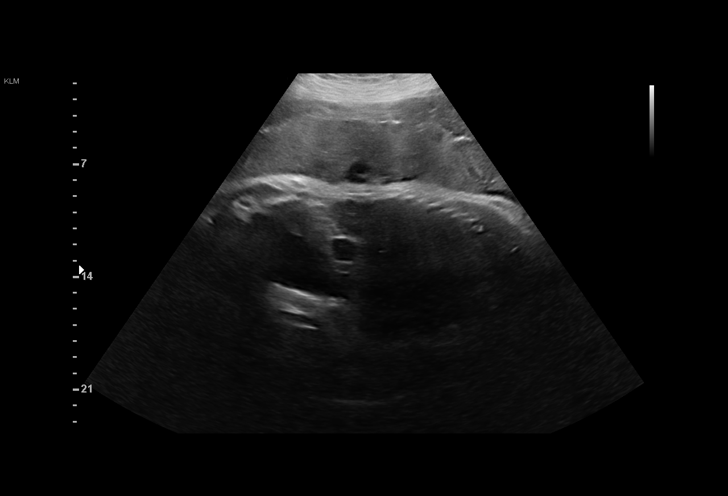
[im 29/35]
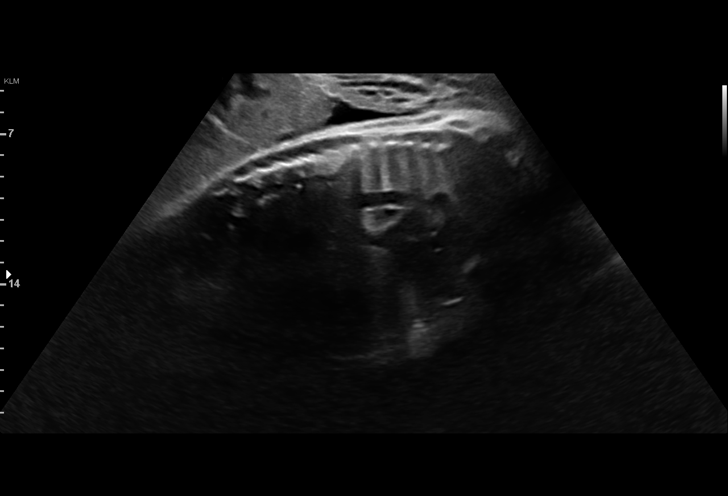
[im 32/35]
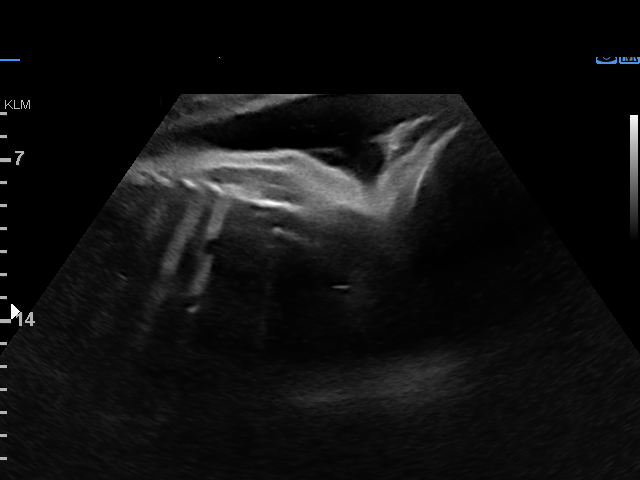
[im 35/35]
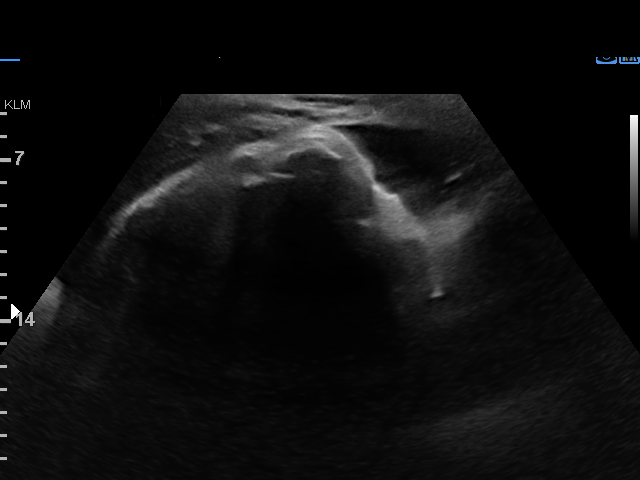

[14 of 28 positions shown; findings below may reference images not displayed]

[id number]

Indications

 37 weeks gestation of pregnancy
 Maternal morbid obesity
 Low risk NIPS
 Poor obstetric history: Previous
 preeclampsia / eclampsia/gestational HTN
 Genetic carrier (SMA carrier)
 Gestational diabetes in pregnancy,
 controlled by oral hypoglycemic drugs
 (metformin)
Vital Signs

                                                Height:        5'9"
Fetal Evaluation

 Num Of Fetuses:         1
 Fetal Heart Rate(bpm):  140
 Cardiac Activity:       Observed
 Presentation:           Cephalic
 Placenta:               Anterior
 P. Cord Insertion:      Previously Visualized

 Amniotic Fluid
 AFI FV:      Within normal limits

 AFI Sum(cm)     %Tile       Largest Pocket(cm)
 15.6            59
 RUQ(cm)       RLQ(cm)       LUQ(cm)        LLQ(cm)

Biophysical Evaluation

 Amniotic F.V:   Within normal limits       F. Tone:        Observed
 F. Movement:    Observed                   Score:          [DATE]
 F. Breathing:   Observed
Biometry

 BPD:     103.5  mm     G. Age:  42w 6d       > 99  %    CI:        74.98   %    70 - 86
                                                         FL/HC:      21.1   %    20.8 -
 HC:      379.2  mm     G. Age:  45w 6d       > 99  %    HC/AC:      1.07        0.92 -
 AC:      355.3  mm     G. Age:  39w 3d         98  %    FL/BPD:     77.5   %    71 - 87
 FL:       80.2  mm     G. Age:  41w 0d       > 99  %    FL/AC:      22.6   %    20 - 24

 Est. FW:    0509  gm      9 lb 6 oz   > 99  %
OB History

 Gravidity:    2         Term:   1
 Living:       1
Gestational Age

 Clinical EDD:  37w 1d                                        EDD:   03/12/20
 U/S Today:     42w 2d                                        EDD:   02/05/20
 Best:          37w 1d     Det. By:  Clinical EDD             EDD:   03/12/20
Anatomy

 Cranium:               Appears normal         LVOT:                   Previously seen
 Cavum:                 Previously seen        Aortic Arch:            Previously seen
 Ventricles:            Appears normal         Ductal Arch:            Previously seen
 Choroid Plexus:        Previously seen        Diaphragm:              Appears normal
 Cerebellum:            Previously seen        Stomach:                Appears normal, left
                                                                       sided
 Posterior Fossa:       Previously seen        Abdomen:                Appears normal
 Nuchal Fold:           Previously seen        Abdominal Wall:         Previously seen
 Face:                  Orbits and profile     Cord Vessels:           Previously seen
                        previously seen
 Lips:                  Appears normal         Kidneys:                Appear normal
 Palate:                Not well visualized    Bladder:                Appears normal
 Thoracic:              Appears normal         Spine:                  Previously seen
 Heart:                 Previously seen        Upper Extremities:      Previously seen
 RVOT:                  Previously seen        Lower Extremities:      Previously seen

 Other:  Nasal bone, Heels and 5th digit previously visualized. Technically
         difficult due to maternal habitus and fetal position.
Cervix Uterus Adnexa

 Cervix
 Not visualized (advanced GA >83wks)
Impression

 Gestational diabetes.  Reportedly well controlled on
 Metformin.  Patient return for fetal growth assessment and
 antenatal testing.
 The estimated fetal weight is at the 99th percentile.  Amniotic
 fluid is normal and good fetal activity seen.  Antenatal testing
 is reassuring.  BPP [DATE].
 Her blood pressures today at our office or 144/80 mmHg and
 139/94 mmHg.  Patient has edema of feet and hands.  She
 does not have symptoms of severe features of preeclampsia.
 I explained the findings and our concern regarding
 gestational hypertension/preeclampsia.  I have recommended
 [facility] evaluation to rule out gestational
 hypertension/preeclampsia.
 Patient agreed to go to the [facility].  I informed the [facility] team.
 If patient has gestational hypertension/preeclampsia, she
 should be delivered.
                 Urquilla, Callie

## 2021-01-01 ENCOUNTER — Telehealth: Payer: Self-pay | Admitting: Nurse Practitioner

## 2021-01-01 ENCOUNTER — Other Ambulatory Visit: Payer: Self-pay

## 2021-01-01 ENCOUNTER — Other Ambulatory Visit: Payer: Self-pay | Admitting: Nurse Practitioner

## 2021-01-01 MED ORDER — CYCLOBENZAPRINE HCL 5 MG PO TABS
5.0000 mg | ORAL_TABLET | Freq: Every day | ORAL | 0 refills | Status: DC
Start: 2021-01-01 — End: 2021-02-17
  Filled 2021-01-01: qty 5, 5d supply, fill #0

## 2021-01-01 NOTE — Telephone Encounter (Signed)
Patient was provided with cyclobenzaprine 5 mg #5.  She has an appointment on 01/05/2019

## 2021-01-01 NOTE — Telephone Encounter (Signed)
Patient requests refill on flexeril for lower back pain that has been prescribed previously. Back pain started again a few days ago.

## 2021-01-01 NOTE — Telephone Encounter (Signed)
Called and spoke with patient she was unaware that  Dorene Grebe was no longer with practice, I explain to her last day was on the 13th , pt was having back pain . I told her that we  could [facility] at [facility] on Monday 820am, she follow up her care at patient care with Crystal until another provider comes. Pt  understood and agreed to appt on Monday at 820 am  gave patient the address to post covid clinic for her appt.

## 2021-01-04 ENCOUNTER — Ambulatory Visit: Payer: Medicaid Other

## 2021-01-04 ENCOUNTER — Other Ambulatory Visit: Payer: Self-pay

## 2021-01-23 MED FILL — Valacyclovir HCl Tab 500 MG: ORAL | 30 days supply | Qty: 60 | Fill #0 | Status: AC

## 2021-01-25 ENCOUNTER — Other Ambulatory Visit: Payer: Self-pay

## 2021-02-17 ENCOUNTER — Other Ambulatory Visit: Payer: Self-pay | Admitting: Nurse Practitioner

## 2021-02-18 ENCOUNTER — Other Ambulatory Visit: Payer: Self-pay

## 2021-02-18 MED ORDER — CYCLOBENZAPRINE HCL 5 MG PO TABS
5.0000 mg | ORAL_TABLET | Freq: Every day | ORAL | 0 refills | Status: AC
  Filled 2021-02-18: qty 5, 5d supply, fill #0

## 2021-03-05 ENCOUNTER — Other Ambulatory Visit: Payer: Self-pay | Admitting: Physician Assistant

## 2021-03-14 ENCOUNTER — Other Ambulatory Visit: Payer: Self-pay

## 2021-03-14 DIAGNOSIS — Z3480 Encounter for supervision of other normal pregnancy, unspecified trimester: Secondary | ICD-10-CM

## 2021-03-14 MED FILL — Buspirone HCl Tab 15 MG: ORAL | 30 days supply | Qty: 60 | Fill #0 | Status: AC

## 2021-03-15 ENCOUNTER — Other Ambulatory Visit: Payer: Self-pay

## 2021-03-15 ENCOUNTER — Telehealth: Payer: Self-pay

## 2021-03-15 MED ORDER — BUTALBITAL-APAP-CAFFEINE 50-325-40 MG PO CAPS: 1.0000 | ORAL_CAPSULE | Freq: Four times a day (QID) | ORAL | 0 refills | Status: DC | PRN

## 2021-03-15 NOTE — Telephone Encounter (Signed)
Med refill  Butalbital  [facility]

## 2021-03-15 NOTE — Telephone Encounter (Signed)
This medication was refilled by Chad Cordial at [facility] for [employer] at [facility]. Patient aware

## 2021-03-16 ENCOUNTER — Other Ambulatory Visit: Payer: Self-pay

## 2021-03-22 ENCOUNTER — Other Ambulatory Visit (HOSPITAL_COMMUNITY): Payer: Self-pay

## 2021-03-22 ENCOUNTER — Other Ambulatory Visit: Payer: Self-pay

## 2021-03-23 ENCOUNTER — Other Ambulatory Visit: Payer: Self-pay

## 2021-03-23 MED ORDER — BUTALBITAL-APAP-CAFFEINE 50-325-40 MG PO TABS: ORAL_TABLET | ORAL | 0 refills | Status: DC

## 2021-03-23 MED ORDER — BUTALBITAL-APAP-CAFFEINE 50-325-40 MG PO TABS: ORAL_TABLET | ORAL | 3 refills | Status: DC

## 2021-03-23 NOTE — Addendum Note (Signed)
Addended by: Eduard Clos on: 03/23/2021 08:29 AM   Modules accepted: Orders

## 2021-03-25 ENCOUNTER — Other Ambulatory Visit: Payer: Self-pay | Admitting: Nurse Practitioner

## 2021-03-25 ENCOUNTER — Other Ambulatory Visit: Payer: Self-pay

## 2021-03-29 ENCOUNTER — Other Ambulatory Visit: Payer: Self-pay

## 2021-03-29 ENCOUNTER — Other Ambulatory Visit (HOSPITAL_COMMUNITY): Payer: Self-pay | Admitting: Nurse Practitioner

## 2021-03-29 MED ORDER — BUTALBITAL-APAP-CAFFEINE 50-325-40 MG PO TABS: ORAL_TABLET | ORAL | 0 refills | Status: DC

## 2021-04-06 ENCOUNTER — Other Ambulatory Visit: Payer: Self-pay

## 2021-04-06 ENCOUNTER — Encounter: Payer: Self-pay | Admitting: Physician Assistant

## 2021-04-06 ENCOUNTER — Ambulatory Visit (INDEPENDENT_AMBULATORY_CARE_PROVIDER_SITE_OTHER): Payer: Medicaid Other | Admitting: Physician Assistant

## 2021-04-06 VITALS — BP 134/85 | HR 84 | Temp 98.3°F | Resp 18 | Ht 69.0 in | Wt 294.0 lb

## 2021-04-06 DIAGNOSIS — F411 Generalized anxiety disorder: Secondary | ICD-10-CM | POA: Diagnosis not present

## 2021-04-06 DIAGNOSIS — G43009 Migraine without aura, not intractable, without status migrainosus: Secondary | ICD-10-CM

## 2021-04-06 MED ORDER — AMITRIPTYLINE HCL 25 MG PO TABS
25.0000 mg | ORAL_TABLET | Freq: Every day | ORAL | 1 refills | Status: DC
  Filled 2021-04-06: qty 30, 30d supply, fill #0

## 2021-04-06 MED ORDER — BUTALBITAL-APAP-CAFFEINE 50-325-40 MG PO TABS
ORAL_TABLET | ORAL | 0 refills | Status: DC
  Filled 2021-04-06: qty 30, 7d supply, fill #0

## 2021-04-06 NOTE — Progress Notes (Signed)
Established Patient Office Visit  Subjective:  [name], female    DOB: 07/11/1996  Age: 25 y.o. MRN: [id number]  CC:  Chief Complaint  Patient presents with   Migraine    HPI [facility] states that she has been suffering migraines, states that they started when she was pregnant with her son over a year ago, states that they have continued and she feels that they are starting to increase in nature.  Reports that she has them on an almost daily basis, will experience 2 or 3 migraine headaches in 1 day.  States that she will experience nausea and photophobia.  Reports that she will use over-the-counter pain medication as well as Fioricet without much relief.  Reports that she is drinking between 3 and 4 bottles of water a day, states that she has never had her vision checked.  Also endorses that she works from home from 330 to midnight and stares at a computer screen the whole time.  Reports that she sleeps approximately 4 hours, does have children in the house making it more difficult for her to get proper rest.  Reports that she is only taking her BuSpar as needed, states that she will use it approximately every other day if she feels her stress level is elevated.     Past Medical History:  Diagnosis Date   Anxiety    Chronic back pain    GERD (gastroesophageal reflux disease)    Headache    Herpes    Infection    UTI   Muscle spasm    Pregnancy induced hypertension    Trichomoniasis     Past Surgical History:  Procedure Laterality Date   NO PAST SURGERIES      Family History  Problem Relation Age of Onset   Diabetes Mother    Diabetes Father    Heart disease Father    Kidney failure Father     Social History   Socioeconomic History   Marital status: Single    Spouse name: Not on file   Number of children: Not on file   Years of education: Not on file   Highest education level: Not on file  Occupational History   Not on file  Tobacco  Use   Smoking status: Never   Smokeless tobacco: Never  Vaping Use   Vaping Use: Never used  Substance and Sexual Activity   Alcohol use: No   Drug use: No   Sexual activity: Yes    Birth control/protection: None  Other Topics Concern   Not on file  Social History Narrative   Not on file   Social Determinants of Health   Financial Resource Strain: Not on file  Food Insecurity: No Food Insecurity   Worried About Running Out of Food in the Last Year: Never true   Ran Out of Food in the Last Year: Never true  Transportation Needs: Unmet Transportation Needs   Lack of Transportation (Medical): Yes   Lack of Transportation (Non-Medical): No  Physical Activity: Not on file  Stress: Not on file  Social Connections: Not on file  Intimate Partner Violence: Not on file    Outpatient Medications Prior to Visit  Medication Sig Dispense Refill   acetaminophen (TYLENOL) 500 MG tablet Take 500 mg by mouth every 6 (six) hours as needed for mild pain or fever.      lisinopril (ZESTRIL) 5 MG tablet TAKE 1 TABLET (5 MG TOTAL) BY MOUTH DAILY. 90 tablet 3  omeprazole (PRILOSEC) 40 MG capsule Take 1 capsule (40 mg total) by mouth as needed. 30 capsule 6   busPIRone (BUSPAR) 15 MG tablet TAKE 1 TABLET (15 MG TOTAL) BY MOUTH 2 (TWO) TIMES DAILY. (Patient not taking: Reported on 04/06/2021) 60 tablet 11   terbinafine (LAMISIL) 250 MG tablet TAKE 1 TABLET (250 MG TOTAL) BY MOUTH DAILY. (Patient not taking: Reported on 04/06/2021) 90 tablet 0   butalbital-acetaminophen-caffeine (FIORICET) 50-325-40 MG tablet TAKE 1 TABLET BY MOUTH EVERY 6 (SIX) HOURS AS NEEDED FOR HEADACHE. (Patient not taking: Reported on 04/06/2021) 30 tablet 0   No facility-administered medications prior to visit.    No Known Allergies  ROS Review of Systems  Constitutional: Negative.   HENT: Negative.    Eyes:  Positive for photophobia.  Respiratory:  Negative for shortness of breath.   Cardiovascular:  Negative for chest pain.   Gastrointestinal:  Positive for nausea. Negative for vomiting.  Endocrine: Negative.   Genitourinary: Negative.   Musculoskeletal: Negative.   Skin: Negative.   Allergic/Immunologic: Negative.   Neurological:  Positive for headaches. Negative for dizziness, speech difficulty and weakness.  Psychiatric/Behavioral:  Positive for sleep disturbance. Negative for self-injury and suicidal ideas. The patient is nervous/anxious.      Objective:    Physical Exam Vitals and nursing note reviewed.  Constitutional:      Appearance: Normal appearance. She is obese.  HENT:     Head: Normocephalic and atraumatic.     Right Ear: External ear normal.     Left Ear: External ear normal.     Nose: Nose normal.     Mouth/Throat:     Mouth: Mucous membranes are moist.     Pharynx: Oropharynx is clear.  Eyes:     Extraocular Movements: Extraocular movements intact.     Conjunctiva/sclera: Conjunctivae normal.     Pupils: Pupils are equal, round, and reactive to light.  Cardiovascular:     Rate and Rhythm: Normal rate and regular rhythm.     Pulses: Normal pulses.     Heart sounds: Normal heart sounds.  Pulmonary:     Effort: Pulmonary effort is normal.     Breath sounds: Normal breath sounds.  Musculoskeletal:        General: Normal range of motion.     Cervical back: Normal range of motion and neck supple.  Skin:    General: Skin is warm and dry.  Neurological:     General: No focal deficit present.     Mental Status: She is alert and oriented to person, place, and time.     Cranial Nerves: No cranial nerve deficit.  Psychiatric:        Mood and Affect: Mood normal.        Behavior: Behavior normal.        Thought Content: Thought content normal.        Judgment: Judgment normal.    BP 134/85 (BP Location: Right Arm, Patient Position: Sitting, Cuff Size: Large)   Pulse 84   Temp 98.3 F (36.8 C) (Temporal)   Resp 18   Ht 5\' 9"  (1.753 m)   Wt 294 lb (133.4 kg)   LMP 03/29/2021    SpO2 99%   BMI 43.42 kg/m  Wt Readings from Last 3 Encounters:  04/06/21 294 lb (133.4 kg)  11/30/20 284 lb (128.8 kg)  06/02/20 257 lb 5.4 oz (116.7 kg)     Health Maintenance Due  Topic Date Due   COVID-19 Vaccine (1)  Never done   HPV VACCINES (2 - 3-dose series) 08/07/2015   CHLAMYDIA SCREENING  02/17/2021   INFLUENZA VACCINE  04/05/2021       Topic Date Due   HPV VACCINES (2 - 3-dose series) 08/07/2015    Lab Results  Component Value Date   TSH 1.500 11/30/2020   Lab Results  Component Value Date   WBC 8.7 11/30/2020   HGB 13.7 11/30/2020   HCT 41.4 11/30/2020   MCV 96 11/30/2020   PLT 241 11/30/2020   Lab Results  Component Value Date   NA CANCELED 11/30/2020   K CANCELED 11/30/2020   CO2 CANCELED 11/30/2020   GLUCOSE CANCELED 11/30/2020   BUN CANCELED 11/30/2020   CREATININE CANCELED 11/30/2020   BILITOT CANCELED 11/30/2020   ALKPHOS CANCELED 11/30/2020   AST CANCELED 11/30/2020   ALT CANCELED 11/30/2020   PROT CANCELED 11/30/2020   ALBUMIN CANCELED 11/30/2020   CALCIUM CANCELED 11/30/2020   ANIONGAP 9 02/21/2020   No results found for: CHOL No results found for: HDL No results found for: LDLCALC No results found for: TRIG No results found for: CHOLHDL Lab Results  Component Value Date   HGBA1C 5.0 11/13/2019      Assessment & Plan:   Problem List Items Addressed This Visit       Cardiovascular and Mediastinum   Migraine without aura and without status migrainosus, not intractable - Primary   Relevant Medications   amitriptyline (ELAVIL) 25 MG tablet   butalbital-acetaminophen-caffeine (FIORICET) 50-325-40 MG tablet   Other Relevant Orders   Ambulatory referral to Neurology   Other Visit Diagnoses     Anxiety state       Relevant Medications   amitriptyline (ELAVIL) 25 MG tablet       Meds ordered this encounter  Medications   amitriptyline (ELAVIL) 25 MG tablet    Sig: Take 1 tablet (25 mg total) by mouth at bedtime.     Dispense:  30 tablet    Refill:  1    Order Specific Question:   Supervising Provider    Answer:   Storm Frisk [[id number]]   butalbital-acetaminophen-caffeine (FIORICET) 50-325-40 MG tablet    Sig: TAKE 1 TABLET BY MOUTH EVERY 6 (SIX) HOURS AS NEEDED FOR HEADACHE.    Dispense:  30 tablet    Refill:  0    Order Specific Question:   Supervising Provider    Answer:   Delford Field, PATRICK E [[id number]]  1. Migraine without aura and without status migrainosus, not intractable Trial Elavil, refill Fioricet.  Patient education given on supportive care, patient encouraged to increase water intake, have a vision exam, improve sleep hygiene. - Ambulatory referral to Neurology - amitriptyline (ELAVIL) 25 MG tablet; Take 1 tablet (25 mg total) by mouth at bedtime.  Dispense: 30 tablet; Refill: 1 - butalbital-acetaminophen-caffeine (FIORICET) 50-325-40 MG tablet; TAKE 1 TABLET BY MOUTH EVERY 6 (SIX) HOURS AS NEEDED FOR HEADACHE.  Dispense: 30 tablet; Refill: 0  2. Anxiety state Patient encouraged to take BuSpar on a daily basis as directed.  Red flags given for prompt reevaluation.   I have reviewed the patient's medical history (PMH, PSH, Social History, Family History, Medications, and allergies) , and have been updated if relevant. I spent 30 minutes reviewing chart and  face to face time with patient.    Follow-up: Return for To establish PCP.    Judith Knudsen Mayers, PA-C

## 2021-04-06 NOTE — Patient Instructions (Signed)
You will start taking amitriptyline once daily at bedtime.  I have started a referral for you to be seen by neurology for further evaluation of your chronic migraines.  I do encourage you to increase your water intake, and to have your vision checked.  I also encourage you to take your BuSpar on a daily basis to help control your overall stress and anxiety.  Roney Jaffe, PA-C Physician Assistant [facility] [email or web address]   Chronic Migraine Headache A migraine is a type of headache that is usually stronger and more sudden than other headaches. Migraines are characterized by an intense pulsing, throbbing pain that is usually only present on one side of the head. Migraine painusually gets worse with activity. Migraines can cause nausea, vomiting, sensitivity to light and sound, and vision changes. Migraines that keep coming back are called recurrent migraines. A migraine is called a chronic migraine if it happens at least 15 days in a month for more than 3 months. Talk with your health care provider about what things may bring on (trigger) your migraines. What are the causes? The exact cause of this condition is not known. However, a migraine may be caused when nerves in the brain become irritated and release chemicals that cause inflammation of blood vessels. The inflammation of the blood vesselscauses pain. Migraines may be triggered or caused by: Smoking. Certain foods and drinks, such as: Aged cheese. Chocolate. Alcohol. Caffeine. Foods or drinks that contain nitrates, glutamate, aspartame, MSG, or tyramine. Medicines, such as birth control pills or some blood pressure medicines. Other things that may trigger a migraine include: Menstruation. Emotional stress. Lack of sleep or too much sleep. Tiredness (fatigue). Bright lights or loud noises. Odors. Weather changes and high altitude. What increases the risk? The  following factors may make you more likely to experience chronic migraine: Having migraines or a family history of migraines. Having a mental health condition, such as depression or anxiety. Having to take a lot of pain medicine. Having sleep problems. Having heart disease, diabetes, or obesity. What are the signs or symptoms? Symptoms of a migraine vary for each person and may include: Pulsating or throbbing pain. Pain that is usually only present on one side of the head. In some cases, the pain may be on both sides of the head or around the head or neck. Severe pain that prevents you from doing daily activities. Pain that gets worse with physical activity. Nausea, vomiting, or both. Pain with exposure to bright lights, loud noises, or activity. General sensitivity to bright lights, loud noises, or smells. Dizziness. A sign that a migraine is becoming chronic is an increasing number of migraine episodes. It is considered chronic if the migraine happens at least 15 days ina month for more than 3 months. How is this diagnosed? This condition is often diagnosed based on: Your symptoms and medical history. A physical exam. You may also have tests, including: A CT scan or an MRI of your brain. These imaging tests cannot diagnose migraines, but they can help to rule out other causes of headaches. Taking fluid from the spine (lumbar puncture) and analyzing it (cerebrospinal fluid analysis, or CSF analysis). Blood tests. How is this treated? This condition is treated with: Medicines. These help to: Lessen pain and nausea. Prevent migraines. Lifestyle changes, such as changes to your diet or sleeping patterns. Behavior therapy. This may include: Relaxation training. Biofeedback. This is a treatment that teaches you to relax and use your brain to lower your  heart rate and control your breathing. Cognitive behavioral therapy (CBT). This is a form of talk therapy. This therapy helps you set  goals and follow up on the changes that you make. Acupuncture. Using a device that provides electrical stimulation to your nerves, which can relieve pain (neuromodulation therapy). Surgery, if the other treatments are not working. Follow these instructions at home: Medicines Take over-the-counter and prescription medicines only as told by your health care provider. Ask your health care provider if the medicine prescribed to you requires you to avoid driving or using machinery. Lifestyle  Do not use any products that contain nicotine or tobacco, such as cigarettes, e-cigarettes, and chewing tobacco. If you need help quitting, ask your health care provider. Do not drink alcohol. Get 7-9 hours of sleep each night, or the amount of sleep recommended by your health care provider. Find ways to manage stress, such as meditation, deep breathing, or yoga. Maintain a healthy weight. If you need help losing weight, ask your health care provider. Exercise regularly. Aim for 150 minutes of moderate-intensity exercise, such as walking, biking, or yoga, or 75 minutes of vigorous exercise each week. Vigorous exercise includes running, circuit training, and swimming.  General instructions  Keep a journal to find out what triggers your migraines so you can avoid these triggers. For example, write down: What you eat and drink. How much sleep you get. Any change to your diet or medicines. Lie down in a dark, quiet room when you have a migraine. Try placing a cool towel over your head when you have a migraine. Keep lights dim, if bright lights bother you or make your migraines worse. Keep all follow-up visits as told by your health care provider. This is important.  Where to find more information Coalition for Headache and Migraine Patients (CHAMP): headachemigraine.org American Migraine Foundation: americanmigrainefoundation.org National Headache Foundation: headaches.org Contact a health care provider  if: Your pain does not improve, even with medicine. Your migraines continue to return, even with medicine. Get help right away if: Your migraine becomes severe and medicine does not help. You have a stiff neck and fever. You have a loss of vision. You have muscle weakness or loss of muscle control. You start losing your balance, or you have trouble walking. You feel like you may faint, or you faint. You start having sudden and unexpected, severe headaches. You have a seizure. Summary Migraine headaches are usually stronger and more sudden than other headaches. Migraines are characterized by an intense pulsing, throbbing pain that is usually only present on one side of the head. Migraines that keep coming back are called recurrent migraines. A migraine is called a chronic migraine if it happens 15 days in a month for more than 3 months. Certain things may trigger migraines, such as lack of sleep or too much sleep, smoking, certain foods, alcohol, stress, and certain medicines. Your treatment plan may include medicines, lifestyle changes, and behavior therapy. This information is not intended to replace advice given to you by your health care provider. Make sure you discuss any questions you have with your healthcare provider. Document Revised: 10/09/2019 Document Reviewed: 10/09/2019 Elsevier Patient Education  2022 [employer].

## 2021-04-06 NOTE — Progress Notes (Signed)
Patient has eaten today. Patient has not taken medication. Patient reports chronic migraines increased during and post pregnancy last year. Patient state Fioricet is providing minimal relief. Pain is center of the forehead scaled at a 9 currently.

## 2021-04-09 ENCOUNTER — Ambulatory Visit: Payer: Medicaid Other | Admitting: Nurse Practitioner

## 2021-04-26 ENCOUNTER — Ambulatory Visit (INDEPENDENT_AMBULATORY_CARE_PROVIDER_SITE_OTHER): Payer: Medicaid Other | Admitting: Primary Care

## 2021-05-05 ENCOUNTER — Other Ambulatory Visit: Payer: Self-pay

## 2021-05-13 ENCOUNTER — Ambulatory Visit: Payer: Medicaid Other | Admitting: Nurse Practitioner

## 2021-05-31 ENCOUNTER — Ambulatory Visit: Payer: Medicaid Other | Admitting: Neurology

## 2021-05-31 ENCOUNTER — Other Ambulatory Visit: Payer: Self-pay

## 2021-05-31 ENCOUNTER — Encounter: Payer: Self-pay | Admitting: Neurology

## 2021-05-31 ENCOUNTER — Other Ambulatory Visit: Payer: Self-pay | Admitting: Neurology

## 2021-05-31 VITALS — BP 126/80 | HR 93 | Ht 69.0 in | Wt 289.0 lb

## 2021-05-31 DIAGNOSIS — G43009 Migraine without aura, not intractable, without status migrainosus: Secondary | ICD-10-CM | POA: Diagnosis not present

## 2021-05-31 MED ORDER — CYCLOBENZAPRINE HCL 10 MG PO TABS: 10.0000 mg | ORAL_TABLET | Freq: Three times a day (TID) | ORAL | 0 refills | Status: DC | PRN

## 2021-05-31 MED ORDER — BUTALBITAL-APAP-CAFFEINE 50-325-40 MG PO TABS: 1.0000 | ORAL_TABLET | ORAL | 0 refills | Status: AC | PRN

## 2021-05-31 MED ORDER — BUTALBITAL-APAP-CAFFEINE 50-325-40 MG PO TABS: 1.0000 | ORAL_TABLET | ORAL | 0 refills | Status: DC | PRN

## 2021-05-31 NOTE — Patient Instructions (Signed)
Continue with Elavil 25 mg nightly  Ibuprofen or Tylenol as needed for headaches  but no more than 3 days per week. We will obtain MRI Brain to rule out IIH.  Will contact patient to discuss MRI results and further treatment.  Return in 6 months or sooner if worse

## 2021-05-31 NOTE — Addendum Note (Signed)
Addended by: Alexia Freestone R on: 05/31/2021 01:41 PM   Modules accepted: Orders

## 2021-05-31 NOTE — Progress Notes (Signed)
[facility]  PATIENT: Judith Terry DOB: 1996-02-26  REFERRING CLINICIAN: Mayers, Cari S, PA-C HISTORY FROM: Patient  REASON FOR VISIT: Headaches    HISTORICAL  CHIEF COMPLAINT:  Chief Complaint  Patient presents with   New Patient (Initial Visit)    Rm ,14, alone, pt c/o daily headaches, states migraines are more intense     HISTORY OF PRESENT ILLNESS:  This is a 25 year old woman with past medical history of hypertension, GERD who is presenting with migraine headache.  Patient stated he has been having headaches for the past 2 years but worse in the past month.  Headaches are bifrontal frontal, behind eyes and described as pressure pain.  She does take Tylenol and Fioricet with some relief.  She says she has been started on lisinopril for high blood pressure but it seemed that it has been making her headaches worse.  For the past month, patient states that headaches have been happening daily.  They usually start during the daytime.  She denies any visual obscuration.  Said that she might have nausea and light sensitivity but no vomiting and no phonophobia.  She is currently on amitriptyline 25 mg nightly said that it helped at night she is able to sleep, she has tried Fioricet and Flexeril as abortive medication.  Family history of headaches include sister with possibly I IH.  She states that her sister had to have a procedure to divert fluid from her brain.   Headache History and Characteristics: Onset: Had headaches in the past 2 years but worse in the past month, Location: Bifrontal behind eyes  Quality: Pressure and pain.  Intensity: 6/10.  Duration: can last up to a day.  Migrainous Features: Photophobia, nausea.  Aura: No  History of brain injury or tumor: No  Family history: Youngest sister with headaches, "had fluid in her brain that was causing her headaches" Motion sickness: no Cardiac history: no  OTC: tylenol, Excedrin  Sleep: 6 hrs on  average  Mood/ Stress: ok   Prior prophylaxis: Propranolol: No  Verapamil:No TCA: Yes, Amitriptyline 25 mg  Topamax: No Depakote: No Effexor: No Cymbalta: No Neurontin:No  Prior abortives: Triptan: No Anti-emetic: No Steroids: No Ergotamine suppository: No  Prior interventions: No    OTHER MEDICAL CONDITIONS: Hypertension, GERD   REVIEW OF SYSTEMS: Full 14 system review of systems performed and negative with exception of: as noted in the HPI  ALLERGIES: No Known Allergies  HOME MEDICATIONS: Outpatient Medications Prior to Visit  Medication Sig Dispense Refill   acetaminophen (TYLENOL) 500 MG tablet Take 500 mg by mouth every 6 (six) hours as needed for mild pain or fever.      amitriptyline (ELAVIL) 25 MG tablet Take 1 tablet (25 mg total) by mouth at bedtime. 30 tablet 1   busPIRone (BUSPAR) 15 MG tablet TAKE 1 TABLET (15 MG TOTAL) BY MOUTH 2 (TWO) TIMES DAILY. 60 tablet 11   lisinopril (ZESTRIL) 5 MG tablet TAKE 1 TABLET (5 MG TOTAL) BY MOUTH DAILY. 90 tablet 3   omeprazole (PRILOSEC) 40 MG capsule Take 1 capsule (40 mg total) by mouth as needed. 30 capsule 6   butalbital-acetaminophen-caffeine (FIORICET) 50-325-40 MG tablet TAKE 1 TABLET BY MOUTH EVERY 6 (SIX) HOURS AS NEEDED FOR HEADACHE. 30 tablet 0   No facility-administered medications prior to visit.    PAST MEDICAL HISTORY: Past Medical History:  Diagnosis Date   Anxiety    Chronic back pain    GERD (gastroesophageal reflux disease)  Headache    Herpes    Infection    UTI   Muscle spasm    Pregnancy induced hypertension    Trichomoniasis     PAST SURGICAL HISTORY: Past Surgical History:  Procedure Laterality Date   NO PAST SURGERIES      FAMILY HISTORY: Family History  Problem Relation Age of Onset   Diabetes Mother    Diabetes Father    Heart disease Father    Kidney failure Father     SOCIAL HISTORY: Social History   Socioeconomic History   Marital status: Single    Spouse  name: Not on file   Number of children: Not on file   Years of education: Not on file   Highest education level: Not on file  Occupational History   Not on file  Tobacco Use   Smoking status: Never   Smokeless tobacco: Never  Vaping Use   Vaping Use: Never used  Substance and Sexual Activity   Alcohol use: No   Drug use: No   Sexual activity: Yes    Birth control/protection: None  Other Topics Concern   Not on file  Social History Narrative   Not on file   Social Determinants of Health   Financial Resource Strain: Not on file  Food Insecurity: Not on file  Transportation Needs: Not on file  Physical Activity: Not on file  Stress: Not on file  Social Connections: Not on file  Intimate Partner Violence: Not on file     PHYSICAL EXAM  GENERAL EXAM/CONSTITUTIONAL: Vitals:  Vitals:   05/31/21 0942  BP: 126/80  Pulse: 93  Weight: 289 lb (131.1 kg)  Height: 5\' 9"  (1.753 m)   Body mass index is 42.68 kg/m. Wt Readings from Last 3 Encounters:  05/31/21 289 lb (131.1 kg)  04/06/21 294 lb (133.4 kg)  11/30/20 284 lb (128.8 kg)   Patient is in no distress; well developed, nourished and groomed; neck is supple  EYES: Pupils round and reactive to light, Visual fields full to confrontation, Extraocular movements intacts,   MUSCULOSKELETAL: Gait, strength, tone, movements noted in Neurologic exam below  NEUROLOGIC: MENTAL STATUS:  No flowsheet data found. awake, alert, oriented to person, place and time recent and remote memory intact normal attention and concentration language fluent, comprehension intact, naming intact fund of knowledge appropriate  CRANIAL NERVE:  2nd - no papilledema or hemorrhages on fundoscopic exam 2nd, 3rd, 4th, 6th - pupils equal and reactive to light, visual fields full to confrontation, extraocular muscles intact, no nystagmus 5th - facial sensation symmetric 7th - facial strength symmetric 8th - hearing intact 9th - palate  elevates symmetrically, uvula midline 11th - shoulder shrug symmetric 12th - tongue protrusion midline  MOTOR:  normal bulk and tone, full strength in the BUE, BLE  SENSORY:  normal and symmetric to light touch, pinprick, temperature, vibration  COORDINATION:  finger-nose-finger, fine finger movements normal  REFLEXES:  deep tendon reflexes present and symmetric  GAIT/STATION:  normal    DIAGNOSTIC DATA (LABS, IMAGING, TESTING) - I reviewed patient records, labs, notes, testing and imaging myself where available.  Lab Results  Component Value Date   WBC 8.7 11/30/2020   HGB 13.7 11/30/2020   HCT 41.4 11/30/2020   MCV 96 11/30/2020   PLT 241 11/30/2020      Component Value Date/Time   NA CANCELED 11/30/2020 1410   K CANCELED 11/30/2020 1410   CL CANCELED 11/30/2020 1410   CO2 CANCELED 11/30/2020 1410  GLUCOSE CANCELED 11/30/2020 1410   GLUCOSE 75 02/21/2020 1422   BUN CANCELED 11/30/2020 1410   CREATININE CANCELED 11/30/2020 1410   CALCIUM CANCELED 11/30/2020 1410   PROT CANCELED 11/30/2020 1410   ALBUMIN CANCELED 11/30/2020 1410   AST CANCELED 11/30/2020 1410   ALT CANCELED 11/30/2020 1410   ALKPHOS CANCELED 11/30/2020 1410   BILITOT CANCELED 11/30/2020 1410   GFRNONAA >60 02/21/2020 1422   GFRAA >60 02/21/2020 1422   No results found for: CHOL, HDL, LDLCALC, LDLDIRECT, TRIG, CHOLHDL Lab Results  Component Value Date   HGBA1C 5.0 11/13/2019   No results found for: VITAMINB12 Lab Results  Component Value Date   TSH 1.500 11/30/2020      ASSESSMENT AND PLAN  25 y.o. year old female with likely migraine due to nausea and photophobia.  She is on amitriptyline 25 mg nightly with mild improvement of nighttime symptoms, she reported she can sleep at night.  I do suspect that she might have idiopathic intracranial hypertension.  I will order a brain MRI and if it indicates IIH, I will switch the patient to Diamox.  If the brain MRI is not indicative of IIH,  I will discuss with the patient either increase her Elavil or start her to propanolol since she also has a history of high blood pressure.  I will contact the patient after completion of the MRI otherwise I will see her in 59-months.   1. Migraine without aura and without status migrainosus, not intractable     PLAN: Continue with Elavil 25 mg nightly  Ibuprofen or Tylenol as needed for headaches  but no more than 3 days per week. We will obtain MRI Brain to rule out IIH.  Will contact patient to discuss MRI results and further treatment.  Return in 6 months or sooner if worse  Orders Placed This Encounter  Procedures   MR BRAIN W WO CONTRAST    Meds ordered this encounter  Medications   butalbital-acetaminophen-caffeine (FIORICET) 50-325-40 MG tablet    Sig: Take 1 tablet by mouth as needed for headache.    Dispense:  30 tablet    Refill:  0   cyclobenzaprine (FLEXERIL) 10 MG tablet    Sig: Take 1 tablet (10 mg total) by mouth 3 (three) times daily as needed for muscle spasms.    Dispense:  30 tablet    Refill:  0    Return in about 6 months (around 11/28/2021).    Windell Norfolk, MD 05/31/2021, 11:26 AM  [facility] [address] [place], [place] [address] [phone]

## 2021-06-01 ENCOUNTER — Telehealth: Payer: Self-pay | Admitting: Neurology

## 2021-06-01 NOTE — Telephone Encounter (Signed)
m[facility] healthy blue pending uploaded notes on the portal 

## 2021-06-07 NOTE — Telephone Encounter (Signed)
mcd healthy blue [name]: [id number] (exp. 06/01/21 to 07/31/21) order sent to GI. They will reach out to the patient to schedule.

## 2021-06-12 ENCOUNTER — Emergency Department (HOSPITAL_COMMUNITY): Payer: Medicaid Other

## 2021-06-12 ENCOUNTER — Telehealth (HOSPITAL_COMMUNITY): Payer: Self-pay | Admitting: Emergency Medicine

## 2021-06-12 ENCOUNTER — Emergency Department (HOSPITAL_COMMUNITY)
Admission: EM | Admit: 2021-06-12 | Discharge: 2021-06-12 | Disposition: A | Discharge status: Home / Self Care | Payer: Medicaid Other | Attending: Emergency Medicine | Admitting: Emergency Medicine

## 2021-06-12 ENCOUNTER — Other Ambulatory Visit: Payer: Self-pay

## 2021-06-12 ENCOUNTER — Encounter (HOSPITAL_COMMUNITY): Payer: Self-pay | Admitting: Emergency Medicine

## 2021-06-12 DIAGNOSIS — K219 Gastro-esophageal reflux disease without esophagitis: Secondary | ICD-10-CM

## 2021-06-12 DIAGNOSIS — R1013 Epigastric pain: Secondary | ICD-10-CM | POA: Diagnosis not present

## 2021-06-12 DIAGNOSIS — Z79899 Other long term (current) drug therapy: Secondary | ICD-10-CM | POA: Insufficient documentation

## 2021-06-12 DIAGNOSIS — N390 Urinary tract infection, site not specified: Secondary | ICD-10-CM | POA: Insufficient documentation

## 2021-06-12 DIAGNOSIS — R112 Nausea with vomiting, unspecified: Secondary | ICD-10-CM

## 2021-06-12 DIAGNOSIS — R109 Unspecified abdominal pain: Secondary | ICD-10-CM | POA: Diagnosis not present

## 2021-06-12 DIAGNOSIS — R110 Nausea: Secondary | ICD-10-CM | POA: Diagnosis not present

## 2021-06-12 LAB — COMPREHENSIVE METABOLIC PANEL
ALT: 22 U/L (ref 0–44)
AST: 20 U/L (ref 15–41)
Albumin: 4.6 g/dL (ref 3.5–5.0)
Alkaline Phosphatase: 85 U/L (ref 38–126)
Anion gap: 9 (ref 5–15)
BUN: 18 mg/dL (ref 6–20)
CO2: 26 mmol/L (ref 22–32)
Calcium: 9.6 mg/dL (ref 8.9–10.3)
Chloride: 104 mmol/L (ref 98–111)
Creatinine, Ser: 0.88 mg/dL (ref 0.44–1.00)
GFR, Estimated: >60 mL/min (ref >60)
Glucose, Bld: 124 mg/dL — ABNORMAL HIGH (ref 70–99)
Potassium: 4.3 mmol/L (ref 3.5–5.1)
Sodium: 139 mmol/L (ref 135–145)
Total Bilirubin: 0.4 mg/dL (ref 0.3–1.2)
Total Protein: 8.4 g/dL — ABNORMAL HIGH (ref 6.5–8.1)

## 2021-06-12 LAB — URINALYSIS, ROUTINE W REFLEX MICROSCOPIC
Bilirubin Urine: NEGATIVE
Glucose, UA: NEGATIVE mg/dL
Hgb urine dipstick: NEGATIVE
Ketones, ur: NEGATIVE mg/dL
Nitrite: NEGATIVE
Protein, ur: 30 mg/dL — AB
Specific Gravity, Urine: 1.027 (ref 1.005–1.030)
pH: 5 (ref 5.0–8.0)

## 2021-06-12 LAB — LIPASE, BLOOD: Lipase: 31 U/L (ref 11–51)

## 2021-06-12 LAB — CBC
HCT: 43.7 % (ref 36.0–46.0)
Hemoglobin: 14.1 g/dL (ref 12.0–15.0)
MCH: 31.3 pg (ref 26.0–34.0)
MCHC: 32.3 g/dL (ref 30.0–36.0)
MCV: 96.9 fL (ref 80.0–100.0)
Platelets: 261 10*3/uL (ref 150–400)
RBC: 4.51 MIL/uL (ref 3.87–5.11)
RDW: 13.2 % (ref 11.5–15.5)
WBC: 10.8 10*3/uL — ABNORMAL HIGH (ref 4.0–10.5)
nRBC: 0 % (ref 0.0–0.2)

## 2021-06-12 LAB — I-STAT BETA HCG BLOOD, ED (MC, WL, AP ONLY): I-stat hCG, quantitative: <5 m[IU]/mL (ref <5)

## 2021-06-12 MED ORDER — IOHEXOL 350 MG/ML SOLN
80.0000 mL | Freq: Once | INTRAVENOUS | Status: AC | PRN
  Administered 2021-06-12: 80 mL via INTRAVENOUS

## 2021-06-12 MED ORDER — SULFAMETHOXAZOLE-TRIMETHOPRIM 800-160 MG PO TABS: 1.0000 | ORAL_TABLET | Freq: Two times a day (BID) | ORAL | 0 refills | Status: AC

## 2021-06-12 MED ORDER — MORPHINE SULFATE (PF) 4 MG/ML IV SOLN
4.0000 mg | Freq: Once | INTRAVENOUS | Status: AC
Start: 2021-06-12 — End: 2021-06-12
  Administered 2021-06-12: 4 mg via INTRAVENOUS
  Filled 2021-06-12: qty 1

## 2021-06-12 MED ORDER — SODIUM CHLORIDE 0.9 % IV BOLUS
1000.0000 mL | Freq: Once | INTRAVENOUS | Status: AC
  Administered 2021-06-12: 1000 mL via INTRAVENOUS

## 2021-06-12 MED ORDER — OMEPRAZOLE 40 MG PO CPDR: 40.0000 mg | DELAYED_RELEASE_CAPSULE | Freq: Every day | ORAL | 0 refills | Status: AC

## 2021-06-12 MED ORDER — ONDANSETRON HCL 4 MG/2ML IJ SOLN
4.0000 mg | Freq: Once | INTRAMUSCULAR | Status: AC
  Administered 2021-06-12: 4 mg via INTRAVENOUS
  Filled 2021-06-12: qty 2

## 2021-06-12 MED ORDER — ONDANSETRON 4 MG PO TBDP: 4.0000 mg | ORAL_TABLET | Freq: Three times a day (TID) | ORAL | 0 refills | Status: AC | PRN

## 2021-06-12 NOTE — ED Provider Notes (Signed)
Emergency Medicine Provider Triage Evaluation Note  [facility] , a 25 y.o. female  was evaluated in triage.  Pt complains of abdominal pain x 3 days. Epigastric with radiation into the lower abdomen and back. Initially intermittent, now constant. Associated N/V.   Review of Systems  Positive: Abdominal pain, N/V Negative: Dysuria, vaginal bleeding, vaginal discharge  Physical Exam  BP (!) 152/106 (BP Location: Left Arm)   Pulse 92   Temp 98.4 F (36.9 C) (Oral)   Resp 16   Ht 5\' 9"  (1.753 m)   Wt 133.4 kg   SpO2 99%   BMI 43.42 kg/m  Gen:   Awake, no distress   Resp:  Normal effort  MSK:   Moves extremities without difficulty  Other:  Generalized abdominal tenderness, no peritoneal signs  Medical Decision Making  Medically screening exam initiated at 1:38 AM.  Appropriate orders placed.  [facility] was informed that the remainder of the evaluation will be completed by another provider, this initial triage assessment does not replace that evaluation, and the importance of remaining in the ED until their evaluation is complete.  Abdominal pain.    [facility], PA-C 06/12/21 0141    08/12/21, MD 06/12/21 [phone]

## 2021-06-12 NOTE — ED Triage Notes (Signed)
Patient here from home reporting mid abd pain near umbilicus with nausea that started Wednesday, increasingly worse today.

## 2021-06-12 NOTE — ED Notes (Signed)
Patient and her visitor given warm blankets. They said they had questions for the nurse. RN notified.

## 2021-06-12 NOTE — ED Provider Notes (Signed)
[facility]-EMERGENCY DEPT Provider Note   CSN: [id number] Arrival date & time: 06/12/21  0003     History Chief Complaint  Patient presents with   Abdominal Pain   Nausea    Judith Terry is a 25 y.o. female with a history of GERD, chronic back pain and anxiety.  Presents to the emergency department with a chief complaint of abdominal pain, nausea, and vomiting.  Patient reports that nausea and abdominal pain started on Wednesday.  Symptoms were intermittent.  Symptoms became worse over the last 24 hours.  Patient reports that abdominal pain has been constant.  Pain is located to epigastric area and radiates throughout abdomen.  Patient rates pain 10/10 on the pain scale.  Pain is described as sharp.  Pain is worse with movement or touch.  Patient has not tried any modalities to alleviate her pain.  No change in pain with eating.  Patient reports vomiting multiple times in the last 24 hours.  States that she has vomited 3 times since being in the emergency department.  Patient describes emesis as bilious.  No coffee-ground emesis or hematemesis.  Patient endorses chills.  Denies any fevers, abdominal distention, diarrhea, constipation, blood in stool, melena, urinary symptoms, vaginal pain, vaginal bleeding, vaginal discharge, genital sores or lesions.  LMP 10/1.  Denies any history of abdominal surgeries.  Patient reports that she takes Prilosec as needed for GERD.   Abdominal Pain Associated symptoms: chills, nausea and vomiting   Associated symptoms: no chest pain, no constipation, no diarrhea, no dysuria, no fever, no hematuria, no shortness of breath, no vaginal bleeding and no vaginal discharge       Past Medical History:  Diagnosis Date   Anxiety    Chronic back pain    GERD (gastroesophageal reflux disease)    Headache    Herpes    Infection    UTI   Muscle spasm    Pregnancy induced hypertension    Trichomoniasis     Patient Active Problem  List   Diagnosis Date Noted   Anxiety state 04/06/2021   Encounter for routine postpartum follow-up 03/26/2020   SVD (spontaneous vaginal delivery) 02/24/2020   GBS (group B Streptococcus carrier), +RV culture, currently pregnant 02/21/2020   Encounter for induction of labor 02/21/2020   LGA (large for gestational age) fetus affecting management of mother 01/31/2020   Migraine without aura and without status migrainosus, not intractable 01/31/2020   Gestational diabetes mellitus (GDM) treated with oral hypoglycemic therapy 01/07/2020   Supervision of high risk pregnancy, antepartum 08/27/2019   History of pre-eclampsia 08/27/2019   Obesity in pregnancy 08/27/2019   Herpes 03/15/2019   GERD (gastroesophageal reflux disease) 11/25/2014    Past Surgical History:  Procedure Laterality Date   NO PAST SURGERIES       OB History     Gravida  2   Para  2   Term  2   Preterm      AB      Living  2      SAB      IAB      Ectopic      Multiple  0   Live Births  2           Family History  Problem Relation Age of Onset   Diabetes Mother    Diabetes Father    Heart disease Father    Kidney failure Father     Social History   Tobacco Use  Smoking status: Never   Smokeless tobacco: Never  Vaping Use   Vaping Use: Never used  Substance Use Topics   Alcohol use: No   Drug use: No    Home Medications Prior to Admission medications   Medication Sig Start Date End Date Taking? Authorizing Provider  acetaminophen (TYLENOL) 500 MG tablet Take 500 mg by mouth every 6 (six) hours as needed for mild pain or fever.     [provider]  amitriptyline (ELAVIL) 25 MG tablet Take 1 tablet (25 mg total) by mouth at bedtime. 04/06/21   Mayers, Cari S, PA-C  butalbital-acetaminophen-caffeine (FIORICET) 50-325-40 MG tablet Take 1 tablet by mouth as needed for headache. May take 1 additional tablet 8 hours later.  Not to exceed 3 tablets in a 24 hour period. 05/31/21    Windell Norfolk, MD  cyclobenzaprine (FLEXERIL) 10 MG tablet Take 1 tablet (10 mg total) by mouth 3 (three) times daily as needed for muscle spasms. 05/31/21   Windell Norfolk, MD  lisinopril (ZESTRIL) 5 MG tablet TAKE 1 TABLET (5 MG TOTAL) BY MOUTH DAILY. 06/02/20 06/02/21  Kallie Locks, FNP  omeprazole (PRILOSEC) 40 MG capsule Take 1 capsule (40 mg total) by mouth as needed. 12/09/20   Kallie Locks, FNP    Allergies    Patient has no known allergies.  Review of Systems   Review of Systems  Constitutional:  Positive for chills. Negative for fever.  Eyes:  Negative for visual disturbance.  Respiratory:  Negative for shortness of breath.   Cardiovascular:  Negative for chest pain.  Gastrointestinal:  Positive for abdominal pain, nausea and vomiting. Negative for abdominal distention, anal bleeding, blood in stool, constipation and diarrhea.  Genitourinary:  Negative for difficulty urinating, dysuria, flank pain, frequency, genital sores, hematuria, pelvic pain, vaginal bleeding, vaginal discharge and vaginal pain.  Musculoskeletal:  Negative for back pain and neck pain.  Skin:  Negative for color change and rash.  Neurological:  Negative for dizziness, syncope, light-headedness and headaches.  Psychiatric/Behavioral:  Negative for confusion.    Physical Exam Updated Vital Signs BP (!) 152/106 (BP Location: Left Arm)   Pulse 92   Temp 98.4 F (36.9 C) (Oral)   Resp 16   Ht 5\' 9"  (1.753 m)   Wt 133.4 kg   SpO2 99%   BMI 43.42 kg/m   Physical Exam Vitals and nursing note reviewed.  Constitutional:      General: She is not in acute distress.    Appearance: She is obese. She is not ill-appearing, toxic-appearing or diaphoretic.  HENT:     Head: Normocephalic.  Eyes:     General: No scleral icterus.       Right eye: No discharge.        Left eye: No discharge.  Cardiovascular:     Rate and Rhythm: Normal rate.  Pulmonary:     Effort: Pulmonary effort is normal. No  tachypnea, bradypnea or respiratory distress.     Breath sounds: Normal breath sounds. No stridor.  Abdominal:     General: Bowel sounds are normal. There is no distension. There are no signs of injury.     Palpations: Abdomen is soft. There is no mass or pulsatile mass.     Tenderness: There is abdominal tenderness in the epigastric area. There is no right CVA tenderness, left CVA tenderness, guarding or rebound.     Hernia: There is no hernia in the umbilical area or ventral area.  Skin:  General: Skin is warm and dry.  Neurological:     General: No focal deficit present.     Mental Status: She is alert.  Psychiatric:        Behavior: Behavior is cooperative.    ED Results / Procedures / Treatments   Labs (all labs ordered are listed, but only abnormal results are displayed) Labs Reviewed  COMPREHENSIVE METABOLIC PANEL - Abnormal; Notable for the following components:      Result Value   Glucose, Bld 124 (*)    Total Protein 8.4 (*)    All other components within normal limits  CBC - Abnormal; Notable for the following components:   WBC 10.8 (*)    All other components within normal limits  URINALYSIS, ROUTINE W REFLEX MICROSCOPIC - Abnormal; Notable for the following components:   APPearance CLOUDY (*)    Protein, ur 30 (*)    Leukocytes,Ua LARGE (*)    Bacteria, UA MANY (*)    All other components within normal limits  LIPASE, BLOOD  I-STAT BETA HCG BLOOD, ED ([facility], [facility], [facility] ONLY)    EKG None  Radiology CT Abdomen Pelvis W Contrast  Result Date: 06/12/2021 CLINICAL DATA:  57-year-old female with history of nonlocalized abdominal pain and nausea, increasingly worsening today. EXAM: CT ABDOMEN AND PELVIS WITH CONTRAST TECHNIQUE: Multidetector CT imaging of the abdomen and pelvis was performed using the standard protocol following bolus administration of intravenous contrast. CONTRAST:  28mL OMNIPAQUE IOHEXOL 350 MG/ML SOLN COMPARISON:  No priors. FINDINGS: Lower chest:  Unremarkable. Hepatobiliary: No suspicious cystic or solid hepatic lesions. No intra or extrahepatic biliary ductal dilatation. Gallbladder is normal in appearance. Pancreas: No pancreatic mass. No pancreatic ductal dilatation. No pancreatic or peripancreatic fluid collections or inflammatory changes. Spleen: Unremarkable. Adrenals/Urinary Tract: The appearance of the kidneys and bilateral adrenal glands is normal. No hydroureteronephrosis. Urinary bladder is normal in appearance. Stomach/Bowel: The appearance of the stomach is normal. No pathologic dilatation of small bowel or colon. Normal appendix. Vascular/Lymphatic: No significant atherosclerotic disease, aneurysm or dissection noted in the abdominal or pelvic vasculature. No lymphadenopathy noted in the abdomen or pelvis. Reproductive: Uterus and ovaries are unremarkable in appearance. Other: No significant volume of ascites.  No pneumoperitoneum. Musculoskeletal: There are no aggressive appearing lytic or blastic lesions noted in the visualized portions of the skeleton. IMPRESSION: 1. No acute findings are noted in the abdomen or pelvis to account for the patient's symptoms. Electronically Signed   By: Trudie Reed M.D.   On: 06/12/2021 07:19    Procedures Procedures   Medications Ordered in ED Medications  morphine 4 MG/ML injection 4 mg (4 mg Intravenous Given 06/12/21 0704)  ondansetron (ZOFRAN) injection 4 mg (4 mg Intravenous Given 06/12/21 0703)  sodium chloride 0.9 % bolus 1,000 mL (1,000 mLs Intravenous New Bag/Given 06/12/21 0704)  iohexol (OMNIPAQUE) 350 MG/ML injection 80 mL (80 mLs Intravenous Contrast Given 06/12/21 0641)    ED Course  I have reviewed the triage vital signs and the nursing notes.  Pertinent labs & imaging results that were available during my care of the patient were reviewed by me and considered in my medical decision making (see chart for details).    MDM Rules/Calculators/A&P                            Alert 25 year old female no acute distress, nontoxic-appearing.  Presents emergency department with complaint of epigastric abdominal pain, nausea, and vomiting.  On  exam abdomen soft, nondistended, tenderness to epigastric area, no guarding or rebound tenderness, no CVA tenderness.  Labs and imaging ordered while patient was in triage. CMP unremarkable Lipase within normal limits CBC shows slight leukocytosis; likely reactive due to patient's reports of nausea and vomiting. Beta-hCG less than 5 CT abdomen pelvis shows no acute abnormalities.  Urinalysis shows bacteria many, WBC 21-50, leukocyte large.  Patient denies any urinary symptoms however due to findings concerning for urinary tract infection will start patient on antibiotics for UTI.  Previous urine culture obtained 2 years prior showed positive for Klebsiella pneumonia.  We will treat patient with course of Bactrim and obtain urine culture.  Low suspicion for pyelonephritis as patient has no CVA tenderness or systemic symptoms.  Patient given morphine, Zofran, and 1 L fluid bolus.  Will reassess and attempt p.o. challenge.  Patient reports marked improvement in her abdominal pain and nausea.  Patient able to tolerate p.o. challenge without difficulty.  On serial reexamination abdomen soft, nondistended, nontender.  Will discharge patient with prescription for Zofran, Prilosec, and Bactrim.  Patient to follow-up with gastroenterology and her primary care provider.   Discussed results, findings, treatment and follow up. Patient advised of return precautions. Patient verbalized understanding and agreed with plan.    Final Clinical Impression(s) / ED Diagnoses Final diagnoses:  Gastroesophageal reflux disease without esophagitis  Urinary tract infection without hematuria, site unspecified  Epigastric pain  Nausea and vomiting, unspecified vomiting type    Rx / DC Orders ED Discharge Orders          Ordered    ondansetron  (ZOFRAN ODT) 4 MG disintegrating tablet  Every 8 hours PRN        06/12/21 0928    sulfamethoxazole-trimethoprim (BACTRIM DS) 800-160 MG tablet  2 times daily        06/12/21 0928    omeprazole (PRILOSEC) 40 MG capsule  Daily        06/12/21 0928             Haskel Schroeder, PA-C 06/12/21 0940    Horton, Clabe Seal, DO 06/13/21 [id number]

## 2021-06-12 NOTE — ED Notes (Signed)
Patient given a [place] sandwich and sprite

## 2021-06-12 NOTE — Discharge Instructions (Addendum)
[facility] to the [facility] today to be evaluated for your nausea, vomiting, and abdominal pain.  Your physical exam, lab work, and CT scan were reassuring.  The CT scan of your abdomen pelvis showed no acute abnormalities.  Your urine sample did show signs of a urinary tract infection, because of this she was started on the antibiotic Bactrim.  I have given you prescription of Zofran for your nausea please take this as prescribed.  I have refilled your prescription for Prilosec, please take this daily.  Please follow-up with your primary care provider and gastroenterologist.  I have given you information for a gastroenterologist in the [place] area if you cannot find one in your hometown of [place].  Get help right away if: Your pain does not go away as soon as your health care provider told you to expect. You cannot stop vomiting. Your pain is only in areas of the abdomen, such as the right side or the left lower portion of the abdomen. Pain on the right side could be caused by appendicitis. You have bloody or black stools, or stools that look like tar. You have severe pain, cramping, or bloating in your abdomen. You have signs of dehydration, such as: Dark urine, very little urine, or no urine. Cracked lips. Dry mouth. Sunken eyes. Sleepiness. Weakness. You have trouble breathing or chest pain.

## 2021-06-13 LAB — URINE CULTURE

## 2021-06-17 ENCOUNTER — Ambulatory Visit
Admission: RE | Admit: 2021-06-17 | Discharge: 2021-06-17 | Disposition: A | Discharge status: Home / Self Care | Payer: Medicaid Other | Source: Ambulatory Visit | Attending: Neurology | Admitting: Neurology

## 2021-06-17 ENCOUNTER — Ambulatory Visit (INDEPENDENT_AMBULATORY_CARE_PROVIDER_SITE_OTHER): Payer: Medicaid Other | Admitting: Primary Care

## 2021-06-17 DIAGNOSIS — G43009 Migraine without aura, not intractable, without status migrainosus: Secondary | ICD-10-CM

## 2021-06-17 MED ORDER — GADOBENATE DIMEGLUMINE 529 MG/ML IV SOLN
20.0000 mL | Freq: Once | INTRAVENOUS | Status: AC | PRN
  Administered 2021-06-17: 20 mL via INTRAVENOUS

## 2021-06-21 ENCOUNTER — Other Ambulatory Visit: Payer: Self-pay

## 2021-06-23 ENCOUNTER — Ambulatory Visit: Payer: Medicaid Other | Admitting: Dermatology

## 2021-07-19 ENCOUNTER — Encounter: Payer: Self-pay | Admitting: Physician Assistant

## 2021-07-19 MED ORDER — VALACYCLOVIR HCL 1 G PO TABS: 1000.0000 mg | ORAL_TABLET | Freq: Two times a day (BID) | ORAL | 0 refills | Status: AC

## 2021-07-19 NOTE — Addendum Note (Signed)
Addended by: Storm Frisk on: 07/19/2021 08:35 PM   Modules accepted: Orders

## 2021-07-31 ENCOUNTER — Other Ambulatory Visit: Payer: Self-pay | Admitting: Neurology

## 2021-08-05 ENCOUNTER — Other Ambulatory Visit: Payer: Self-pay | Admitting: Physician Assistant

## 2021-08-05 DIAGNOSIS — G43009 Migraine without aura, not intractable, without status migrainosus: Secondary | ICD-10-CM

## 2021-08-13 ENCOUNTER — Other Ambulatory Visit: Payer: Self-pay | Admitting: Critical Care Medicine

## 2021-08-13 NOTE — Telephone Encounter (Signed)
Requested medication (s) are due for refill today: NO  Requested medication (s) are on the active medication list: NO  Future visit scheduled: no  Notes to clinic:  dose completed after 10 days. PCP listed as [facility], Dr. Delford Field ordered this rx, please assess.   Requested Prescriptions  Pending Prescriptions Disp Refills   valACYclovir (VALTREX) 1000 MG tablet [Pharmacy Med Name: VALACYCLOVIR HCL 1 GRAM TABLET] 20 tablet 0    Sig: Take 1 tablet (1,000 mg total) by mouth 2 (two) times daily for 10 days.     Antimicrobials:  Antiviral Agents - Anti-Herpetic Failed - 08/13/2021 12:02 AM      Failed - Valid encounter within last 12 months    Recent Outpatient Visits   None

## 2021-09-24 ENCOUNTER — Other Ambulatory Visit: Payer: Self-pay | Admitting: Critical Care Medicine

## 2021-09-24 ENCOUNTER — Other Ambulatory Visit: Payer: Self-pay | Admitting: Neurology

## 2021-09-24 NOTE — Telephone Encounter (Signed)
Requested medication (s) are due for refill today:   Not sure  Requested medication (s) are on the active medication list:   No  Future visit scheduled:   No    Last ordered: Don't know  Pt is seeing a PCP at [facility] in [place], [place]    It looks like Dr. Joya Gaskins filled this one time in [place]' absence.    Requested Prescriptions  Pending Prescriptions Disp Refills   valACYclovir (VALTREX) 1000 MG tablet [Pharmacy Med Name: VALACYCLOVIR HCL 1 GRAM TABLET] 20 tablet 0    Sig: Take 1 tablet (1,000 mg total) by mouth 2 (two) times daily for 10 days.     Antimicrobials:  Antiviral Agents - Anti-Herpetic Failed - 09/24/2021 12:28 AM      Failed - Valid encounter within last 12 months    Recent Outpatient Visits   None

## 2021-09-27 NOTE — Telephone Encounter (Signed)
Rx refilled.

## 2021-09-30 ENCOUNTER — Other Ambulatory Visit: Payer: Self-pay | Admitting: *Deleted

## 2021-09-30 ENCOUNTER — Encounter: Payer: Self-pay | Admitting: Neurology

## 2021-09-30 DIAGNOSIS — I10 Essential (primary) hypertension: Secondary | ICD-10-CM | POA: Diagnosis not present

## 2021-09-30 DIAGNOSIS — Z113 Encounter for screening for infections with a predominantly sexual mode of transmission: Secondary | ICD-10-CM | POA: Diagnosis not present

## 2021-09-30 DIAGNOSIS — Z0001 Encounter for general adult medical examination with abnormal findings: Secondary | ICD-10-CM | POA: Diagnosis not present

## 2021-09-30 DIAGNOSIS — Z01419 Encounter for gynecological examination (general) (routine) without abnormal findings: Secondary | ICD-10-CM | POA: Diagnosis not present

## 2021-09-30 DIAGNOSIS — G43009 Migraine without aura, not intractable, without status migrainosus: Secondary | ICD-10-CM

## 2021-09-30 MED ORDER — AMITRIPTYLINE HCL 25 MG PO TABS: 25.0000 mg | ORAL_TABLET | Freq: Every day | ORAL | 5 refills | Status: AC

## 2021-09-30 NOTE — Telephone Encounter (Signed)
Rx sent to [facility] in [place], [place]. I called the patient and confirmed this is the correct location.

## 2021-10-07 DIAGNOSIS — I10 Essential (primary) hypertension: Secondary | ICD-10-CM | POA: Diagnosis not present

## 2021-10-14 DIAGNOSIS — Z3046 Encounter for surveillance of implantable subdermal contraceptive: Secondary | ICD-10-CM | POA: Diagnosis not present

## 2021-11-28 ENCOUNTER — Encounter: Payer: Self-pay | Admitting: Neurology

## 2021-11-29 ENCOUNTER — Encounter: Payer: Self-pay | Admitting: Neurology

## 2021-11-29 ENCOUNTER — Telehealth: Payer: Medicaid Other | Admitting: Neurology

## 2022-02-09 DIAGNOSIS — Z6841 Body Mass Index (BMI) 40.0 and over, adult: Secondary | ICD-10-CM | POA: Diagnosis not present

## 2022-02-09 DIAGNOSIS — G43009 Migraine without aura, not intractable, without status migrainosus: Secondary | ICD-10-CM | POA: Diagnosis not present

## 2022-02-09 DIAGNOSIS — I10 Essential (primary) hypertension: Secondary | ICD-10-CM | POA: Diagnosis not present

## 2022-02-09 DIAGNOSIS — K219 Gastro-esophageal reflux disease without esophagitis: Secondary | ICD-10-CM | POA: Diagnosis not present

## 2022-02-09 DIAGNOSIS — F418 Other specified anxiety disorders: Secondary | ICD-10-CM | POA: Diagnosis not present

## 2022-03-01 DIAGNOSIS — Z3009 Encounter for other general counseling and advice on contraception: Secondary | ICD-10-CM | POA: Diagnosis not present

## 2022-03-01 DIAGNOSIS — Z3201 Encounter for pregnancy test, result positive: Secondary | ICD-10-CM | POA: Diagnosis not present

## 2022-03-07 DIAGNOSIS — N3000 Acute cystitis without hematuria: Secondary | ICD-10-CM | POA: Diagnosis not present

## 2022-03-07 DIAGNOSIS — N390 Urinary tract infection, site not specified: Secondary | ICD-10-CM | POA: Diagnosis not present

## 2022-04-01 DIAGNOSIS — R110 Nausea: Secondary | ICD-10-CM | POA: Diagnosis not present

## 2022-04-01 DIAGNOSIS — O10019 Pre-existing essential hypertension complicating pregnancy, unspecified trimester: Secondary | ICD-10-CM | POA: Diagnosis not present

## 2022-04-01 DIAGNOSIS — Z3687 Encounter for antenatal screening for uncertain dates: Secondary | ICD-10-CM | POA: Diagnosis not present

## 2022-04-01 DIAGNOSIS — R519 Headache, unspecified: Secondary | ICD-10-CM | POA: Diagnosis not present

## 2022-04-01 DIAGNOSIS — F419 Anxiety disorder, unspecified: Secondary | ICD-10-CM | POA: Diagnosis not present

## 2022-04-01 DIAGNOSIS — Z3A09 9 weeks gestation of pregnancy: Secondary | ICD-10-CM | POA: Diagnosis not present

## 2022-04-01 DIAGNOSIS — M62830 Muscle spasm of back: Secondary | ICD-10-CM | POA: Diagnosis not present

## 2022-04-12 DIAGNOSIS — I10 Essential (primary) hypertension: Secondary | ICD-10-CM | POA: Diagnosis not present

## 2022-04-12 DIAGNOSIS — K219 Gastro-esophageal reflux disease without esophagitis: Secondary | ICD-10-CM | POA: Diagnosis not present

## 2022-04-12 DIAGNOSIS — G43009 Migraine without aura, not intractable, without status migrainosus: Secondary | ICD-10-CM | POA: Diagnosis not present

## 2022-04-12 DIAGNOSIS — F411 Generalized anxiety disorder: Secondary | ICD-10-CM | POA: Diagnosis not present

## 2022-04-12 DIAGNOSIS — B001 Herpesviral vesicular dermatitis: Secondary | ICD-10-CM | POA: Diagnosis not present

## 2022-04-14 DIAGNOSIS — Z3687 Encounter for antenatal screening for uncertain dates: Secondary | ICD-10-CM | POA: Diagnosis not present

## 2022-04-22 DIAGNOSIS — Z3491 Encounter for supervision of normal pregnancy, unspecified, first trimester: Secondary | ICD-10-CM | POA: Diagnosis not present

## 2022-04-22 DIAGNOSIS — Z31430 Encounter of female for testing for genetic disease carrier status for procreative management: Secondary | ICD-10-CM | POA: Diagnosis not present

## 2022-04-22 DIAGNOSIS — O10019 Pre-existing essential hypertension complicating pregnancy, unspecified trimester: Secondary | ICD-10-CM | POA: Diagnosis not present

## 2022-04-22 DIAGNOSIS — Z3A12 12 weeks gestation of pregnancy: Secondary | ICD-10-CM | POA: Diagnosis not present

## 2022-04-22 DIAGNOSIS — Z36 Encounter for antenatal screening for chromosomal anomalies: Secondary | ICD-10-CM | POA: Diagnosis not present

## 2022-05-20 DIAGNOSIS — Z3480 Encounter for supervision of other normal pregnancy, unspecified trimester: Secondary | ICD-10-CM | POA: Diagnosis not present

## 2022-06-28 DIAGNOSIS — Z363 Encounter for antenatal screening for malformations: Secondary | ICD-10-CM | POA: Diagnosis not present

## 2022-07-01 DIAGNOSIS — Z20822 Contact with and (suspected) exposure to covid-19: Secondary | ICD-10-CM | POA: Diagnosis not present

## 2022-07-01 DIAGNOSIS — B9789 Other viral agents as the cause of diseases classified elsewhere: Secondary | ICD-10-CM | POA: Diagnosis not present

## 2022-07-01 DIAGNOSIS — J069 Acute upper respiratory infection, unspecified: Secondary | ICD-10-CM | POA: Diagnosis not present

## 2022-07-01 DIAGNOSIS — Z3A22 22 weeks gestation of pregnancy: Secondary | ICD-10-CM | POA: Diagnosis not present

## 2022-07-01 DIAGNOSIS — O99512 Diseases of the respiratory system complicating pregnancy, second trimester: Secondary | ICD-10-CM | POA: Diagnosis not present

## 2022-07-01 DIAGNOSIS — O98512 Other viral diseases complicating pregnancy, second trimester: Secondary | ICD-10-CM | POA: Diagnosis not present

## 2022-07-01 DIAGNOSIS — Z1152 Encounter for screening for COVID-19: Secondary | ICD-10-CM | POA: Diagnosis not present

## 2022-07-15 DIAGNOSIS — R319 Hematuria, unspecified: Secondary | ICD-10-CM | POA: Diagnosis not present

## 2022-07-25 DIAGNOSIS — Z3A26 26 weeks gestation of pregnancy: Secondary | ICD-10-CM | POA: Diagnosis not present

## 2022-07-25 DIAGNOSIS — N898 Other specified noninflammatory disorders of vagina: Secondary | ICD-10-CM | POA: Diagnosis not present

## 2022-08-03 DIAGNOSIS — R319 Hematuria, unspecified: Secondary | ICD-10-CM | POA: Diagnosis not present

## 2022-08-18 DIAGNOSIS — Z3493 Encounter for supervision of normal pregnancy, unspecified, third trimester: Secondary | ICD-10-CM | POA: Diagnosis not present

## 2022-08-18 DIAGNOSIS — Z3483 Encounter for supervision of other normal pregnancy, third trimester: Secondary | ICD-10-CM | POA: Diagnosis not present

## 2022-08-18 DIAGNOSIS — Z3A29 29 weeks gestation of pregnancy: Secondary | ICD-10-CM | POA: Diagnosis not present

## 2022-08-18 DIAGNOSIS — Z23 Encounter for immunization: Secondary | ICD-10-CM | POA: Diagnosis not present

## 2022-09-02 DIAGNOSIS — O24919 Unspecified diabetes mellitus in pregnancy, unspecified trimester: Secondary | ICD-10-CM | POA: Diagnosis not present

## 2022-09-02 DIAGNOSIS — Z3A31 31 weeks gestation of pregnancy: Secondary | ICD-10-CM | POA: Diagnosis not present

## 2022-09-12 DIAGNOSIS — O169 Unspecified maternal hypertension, unspecified trimester: Secondary | ICD-10-CM | POA: Diagnosis not present

## 2022-09-12 DIAGNOSIS — R519 Headache, unspecified: Secondary | ICD-10-CM | POA: Diagnosis not present

## 2022-09-12 DIAGNOSIS — Z3A33 33 weeks gestation of pregnancy: Secondary | ICD-10-CM | POA: Diagnosis not present

## 2022-09-12 DIAGNOSIS — O24919 Unspecified diabetes mellitus in pregnancy, unspecified trimester: Secondary | ICD-10-CM | POA: Diagnosis not present

## 2022-09-15 DIAGNOSIS — O10013 Pre-existing essential hypertension complicating pregnancy, third trimester: Secondary | ICD-10-CM | POA: Diagnosis not present

## 2022-09-21 DIAGNOSIS — Z3A34 34 weeks gestation of pregnancy: Secondary | ICD-10-CM | POA: Diagnosis not present

## 2022-09-21 DIAGNOSIS — O24919 Unspecified diabetes mellitus in pregnancy, unspecified trimester: Secondary | ICD-10-CM | POA: Diagnosis not present

## 2022-09-21 DIAGNOSIS — O169 Unspecified maternal hypertension, unspecified trimester: Secondary | ICD-10-CM | POA: Diagnosis not present

## 2022-09-22 DIAGNOSIS — O10013 Pre-existing essential hypertension complicating pregnancy, third trimester: Secondary | ICD-10-CM | POA: Diagnosis not present

## 2022-09-28 DIAGNOSIS — Z3A35 35 weeks gestation of pregnancy: Secondary | ICD-10-CM | POA: Diagnosis not present

## 2022-09-28 DIAGNOSIS — B009 Herpesviral infection, unspecified: Secondary | ICD-10-CM | POA: Diagnosis not present

## 2022-09-28 DIAGNOSIS — O24919 Unspecified diabetes mellitus in pregnancy, unspecified trimester: Secondary | ICD-10-CM | POA: Diagnosis not present

## 2022-09-29 DIAGNOSIS — O10013 Pre-existing essential hypertension complicating pregnancy, third trimester: Secondary | ICD-10-CM | POA: Diagnosis not present

## 2022-10-04 DIAGNOSIS — Z3491 Encounter for supervision of normal pregnancy, unspecified, first trimester: Secondary | ICD-10-CM | POA: Diagnosis not present

## 2022-10-06 DIAGNOSIS — O10013 Pre-existing essential hypertension complicating pregnancy, third trimester: Secondary | ICD-10-CM | POA: Diagnosis not present

## 2022-10-06 DIAGNOSIS — O24410 Gestational diabetes mellitus in pregnancy, diet controlled: Secondary | ICD-10-CM | POA: Diagnosis not present

## 2022-10-11 DIAGNOSIS — B009 Herpesviral infection, unspecified: Secondary | ICD-10-CM | POA: Diagnosis not present

## 2022-10-11 DIAGNOSIS — O24919 Unspecified diabetes mellitus in pregnancy, unspecified trimester: Secondary | ICD-10-CM | POA: Diagnosis not present

## 2022-10-11 DIAGNOSIS — Z3A37 37 weeks gestation of pregnancy: Secondary | ICD-10-CM | POA: Diagnosis not present

## 2022-10-11 DIAGNOSIS — O169 Unspecified maternal hypertension, unspecified trimester: Secondary | ICD-10-CM | POA: Diagnosis not present

## 2022-10-13 DIAGNOSIS — O10013 Pre-existing essential hypertension complicating pregnancy, third trimester: Secondary | ICD-10-CM | POA: Diagnosis not present

## 2022-10-16 DIAGNOSIS — I10 Essential (primary) hypertension: Secondary | ICD-10-CM | POA: Diagnosis not present

## 2022-10-16 DIAGNOSIS — Z79899 Other long term (current) drug therapy: Secondary | ICD-10-CM | POA: Diagnosis not present

## 2022-10-16 DIAGNOSIS — Z8632 Personal history of gestational diabetes: Secondary | ICD-10-CM | POA: Diagnosis not present

## 2022-10-16 DIAGNOSIS — O99334 Smoking (tobacco) complicating childbirth: Secondary | ICD-10-CM | POA: Diagnosis not present

## 2022-10-16 DIAGNOSIS — O134 Gestational [pregnancy-induced] hypertension without significant proteinuria, complicating childbirth: Secondary | ICD-10-CM | POA: Diagnosis not present

## 2022-10-16 DIAGNOSIS — F17210 Nicotine dependence, cigarettes, uncomplicated: Secondary | ICD-10-CM | POA: Diagnosis not present

## 2022-10-16 DIAGNOSIS — B009 Herpesviral infection, unspecified: Secondary | ICD-10-CM | POA: Diagnosis not present

## 2022-10-16 DIAGNOSIS — O9852 Other viral diseases complicating childbirth: Secondary | ICD-10-CM | POA: Diagnosis not present

## 2022-10-16 DIAGNOSIS — O133 Gestational [pregnancy-induced] hypertension without significant proteinuria, third trimester: Secondary | ICD-10-CM | POA: Diagnosis not present

## 2022-10-16 DIAGNOSIS — Z3A38 38 weeks gestation of pregnancy: Secondary | ICD-10-CM | POA: Diagnosis not present

## 2022-10-16 DIAGNOSIS — Z7984 Long term (current) use of oral hypoglycemic drugs: Secondary | ICD-10-CM | POA: Diagnosis not present

## 2022-10-17 DIAGNOSIS — Z3A38 38 weeks gestation of pregnancy: Secondary | ICD-10-CM | POA: Diagnosis not present

## 2022-11-28 DIAGNOSIS — F53 Postpartum depression: Secondary | ICD-10-CM | POA: Diagnosis not present

## 2022-11-28 DIAGNOSIS — O24919 Unspecified diabetes mellitus in pregnancy, unspecified trimester: Secondary | ICD-10-CM | POA: Diagnosis not present

## 2022-12-13 DIAGNOSIS — Z79899 Other long term (current) drug therapy: Secondary | ICD-10-CM | POA: Diagnosis not present

## 2022-12-13 DIAGNOSIS — F411 Generalized anxiety disorder: Secondary | ICD-10-CM | POA: Diagnosis not present

## 2022-12-13 DIAGNOSIS — Z30017 Encounter for initial prescription of implantable subdermal contraceptive: Secondary | ICD-10-CM | POA: Diagnosis not present

## 2022-12-13 DIAGNOSIS — F439 Reaction to severe stress, unspecified: Secondary | ICD-10-CM | POA: Diagnosis not present

## 2022-12-13 DIAGNOSIS — F32A Depression, unspecified: Secondary | ICD-10-CM | POA: Diagnosis not present

## 2022-12-13 DIAGNOSIS — Z3046 Encounter for surveillance of implantable subdermal contraceptive: Secondary | ICD-10-CM | POA: Diagnosis not present

## 2023-01-09 DIAGNOSIS — I10 Essential (primary) hypertension: Secondary | ICD-10-CM | POA: Diagnosis not present

## 2023-01-09 DIAGNOSIS — F418 Other specified anxiety disorders: Secondary | ICD-10-CM | POA: Diagnosis not present

## 2023-01-09 DIAGNOSIS — E669 Obesity, unspecified: Secondary | ICD-10-CM | POA: Diagnosis not present

## 2023-01-12 DIAGNOSIS — F32A Depression, unspecified: Secondary | ICD-10-CM | POA: Diagnosis not present

## 2023-01-12 DIAGNOSIS — F411 Generalized anxiety disorder: Secondary | ICD-10-CM | POA: Diagnosis not present

## 2023-01-12 DIAGNOSIS — F439 Reaction to severe stress, unspecified: Secondary | ICD-10-CM | POA: Diagnosis not present

## 2023-01-26 DIAGNOSIS — F411 Generalized anxiety disorder: Secondary | ICD-10-CM | POA: Diagnosis not present

## 2023-01-26 DIAGNOSIS — F439 Reaction to severe stress, unspecified: Secondary | ICD-10-CM | POA: Diagnosis not present

## 2023-01-26 DIAGNOSIS — F32A Depression, unspecified: Secondary | ICD-10-CM | POA: Diagnosis not present

## 2023-02-23 DIAGNOSIS — F331 Major depressive disorder, recurrent, moderate: Secondary | ICD-10-CM | POA: Diagnosis not present

## 2023-02-23 DIAGNOSIS — F419 Anxiety disorder, unspecified: Secondary | ICD-10-CM | POA: Diagnosis not present

## 2023-02-27 DIAGNOSIS — F411 Generalized anxiety disorder: Secondary | ICD-10-CM | POA: Diagnosis not present

## 2023-02-27 DIAGNOSIS — F32A Depression, unspecified: Secondary | ICD-10-CM | POA: Diagnosis not present

## 2023-02-27 DIAGNOSIS — F439 Reaction to severe stress, unspecified: Secondary | ICD-10-CM | POA: Diagnosis not present

## 2023-03-20 DIAGNOSIS — F331 Major depressive disorder, recurrent, moderate: Secondary | ICD-10-CM | POA: Diagnosis not present

## 2023-03-20 DIAGNOSIS — F411 Generalized anxiety disorder: Secondary | ICD-10-CM | POA: Diagnosis not present

## 2023-03-20 DIAGNOSIS — F419 Anxiety disorder, unspecified: Secondary | ICD-10-CM | POA: Diagnosis not present

## 2023-03-20 DIAGNOSIS — F439 Reaction to severe stress, unspecified: Secondary | ICD-10-CM | POA: Diagnosis not present

## 2023-03-20 DIAGNOSIS — F32A Depression, unspecified: Secondary | ICD-10-CM | POA: Diagnosis not present

## 2023-04-03 DIAGNOSIS — F419 Anxiety disorder, unspecified: Secondary | ICD-10-CM | POA: Diagnosis not present

## 2023-04-03 DIAGNOSIS — F331 Major depressive disorder, recurrent, moderate: Secondary | ICD-10-CM | POA: Diagnosis not present

## 2023-04-11 DIAGNOSIS — K219 Gastro-esophageal reflux disease without esophagitis: Secondary | ICD-10-CM | POA: Diagnosis not present

## 2023-04-11 DIAGNOSIS — I10 Essential (primary) hypertension: Secondary | ICD-10-CM | POA: Diagnosis not present

## 2023-04-11 DIAGNOSIS — M545 Low back pain, unspecified: Secondary | ICD-10-CM | POA: Diagnosis not present

## 2023-04-11 DIAGNOSIS — E669 Obesity, unspecified: Secondary | ICD-10-CM | POA: Diagnosis not present

## 2023-04-17 DIAGNOSIS — F419 Anxiety disorder, unspecified: Secondary | ICD-10-CM | POA: Diagnosis not present

## 2023-04-17 DIAGNOSIS — F331 Major depressive disorder, recurrent, moderate: Secondary | ICD-10-CM | POA: Diagnosis not present

## 2023-04-20 DIAGNOSIS — F439 Reaction to severe stress, unspecified: Secondary | ICD-10-CM | POA: Diagnosis not present

## 2023-04-20 DIAGNOSIS — F32A Depression, unspecified: Secondary | ICD-10-CM | POA: Diagnosis not present

## 2023-04-20 DIAGNOSIS — F411 Generalized anxiety disorder: Secondary | ICD-10-CM | POA: Diagnosis not present

## 2023-04-26 DIAGNOSIS — F419 Anxiety disorder, unspecified: Secondary | ICD-10-CM | POA: Diagnosis not present

## 2023-04-26 DIAGNOSIS — F331 Major depressive disorder, recurrent, moderate: Secondary | ICD-10-CM | POA: Diagnosis not present

## 2023-04-30 DIAGNOSIS — F331 Major depressive disorder, recurrent, moderate: Secondary | ICD-10-CM | POA: Diagnosis not present

## 2023-04-30 DIAGNOSIS — F419 Anxiety disorder, unspecified: Secondary | ICD-10-CM | POA: Diagnosis not present

## 2023-05-15 DIAGNOSIS — F331 Major depressive disorder, recurrent, moderate: Secondary | ICD-10-CM | POA: Diagnosis not present

## 2023-05-15 DIAGNOSIS — F419 Anxiety disorder, unspecified: Secondary | ICD-10-CM | POA: Diagnosis not present

## 2023-05-18 DIAGNOSIS — F439 Reaction to severe stress, unspecified: Secondary | ICD-10-CM | POA: Diagnosis not present

## 2023-05-18 DIAGNOSIS — F411 Generalized anxiety disorder: Secondary | ICD-10-CM | POA: Diagnosis not present

## 2023-05-18 DIAGNOSIS — F32A Depression, unspecified: Secondary | ICD-10-CM | POA: Diagnosis not present

## 2023-05-29 DIAGNOSIS — F419 Anxiety disorder, unspecified: Secondary | ICD-10-CM | POA: Diagnosis not present

## 2023-05-29 DIAGNOSIS — F331 Major depressive disorder, recurrent, moderate: Secondary | ICD-10-CM | POA: Diagnosis not present

## 2023-05-30 DIAGNOSIS — F419 Anxiety disorder, unspecified: Secondary | ICD-10-CM | POA: Diagnosis not present

## 2023-05-30 DIAGNOSIS — F331 Major depressive disorder, recurrent, moderate: Secondary | ICD-10-CM | POA: Diagnosis not present

## 2023-06-20 DIAGNOSIS — F439 Reaction to severe stress, unspecified: Secondary | ICD-10-CM | POA: Diagnosis not present

## 2023-06-20 DIAGNOSIS — F411 Generalized anxiety disorder: Secondary | ICD-10-CM | POA: Diagnosis not present

## 2023-06-20 DIAGNOSIS — F32A Depression, unspecified: Secondary | ICD-10-CM | POA: Diagnosis not present

## 2023-07-21 DIAGNOSIS — F439 Reaction to severe stress, unspecified: Secondary | ICD-10-CM | POA: Diagnosis not present

## 2023-07-21 DIAGNOSIS — F411 Generalized anxiety disorder: Secondary | ICD-10-CM | POA: Diagnosis not present

## 2023-07-21 DIAGNOSIS — Z716 Tobacco abuse counseling: Secondary | ICD-10-CM | POA: Diagnosis not present

## 2023-07-21 DIAGNOSIS — F32A Depression, unspecified: Secondary | ICD-10-CM | POA: Diagnosis not present

## 2023-08-07 DIAGNOSIS — F331 Major depressive disorder, recurrent, moderate: Secondary | ICD-10-CM | POA: Diagnosis not present

## 2023-08-07 DIAGNOSIS — F419 Anxiety disorder, unspecified: Secondary | ICD-10-CM | POA: Diagnosis not present

## 2023-08-17 DIAGNOSIS — F331 Major depressive disorder, recurrent, moderate: Secondary | ICD-10-CM | POA: Diagnosis not present

## 2023-08-17 DIAGNOSIS — F419 Anxiety disorder, unspecified: Secondary | ICD-10-CM | POA: Diagnosis not present

## 2023-09-18 DIAGNOSIS — F32A Depression, unspecified: Secondary | ICD-10-CM | POA: Diagnosis not present

## 2023-09-18 DIAGNOSIS — F411 Generalized anxiety disorder: Secondary | ICD-10-CM | POA: Diagnosis not present

## 2023-09-18 DIAGNOSIS — F439 Reaction to severe stress, unspecified: Secondary | ICD-10-CM | POA: Diagnosis not present
